# Patient Record
Sex: Male | Born: 1950
Health system: Southern US, Community
[De-identification: ages and names within clinical notes are randomized; demographics above are authoritative.]

## PROBLEM LIST (undated history)

## (undated) DIAGNOSIS — I2510 Atherosclerotic heart disease of native coronary artery without angina pectoris: Secondary | ICD-10-CM

## (undated) DIAGNOSIS — F988 Other specified behavioral and emotional disorders with onset usually occurring in childhood and adolescence: Secondary | ICD-10-CM

## (undated) DIAGNOSIS — Z973 Presence of spectacles and contact lenses: Secondary | ICD-10-CM

## (undated) DIAGNOSIS — N200 Calculus of kidney: Secondary | ICD-10-CM

## (undated) DIAGNOSIS — E785 Hyperlipidemia, unspecified: Secondary | ICD-10-CM

## (undated) DIAGNOSIS — E291 Testicular hypofunction: Secondary | ICD-10-CM

## (undated) DIAGNOSIS — G3184 Mild cognitive impairment, so stated: Secondary | ICD-10-CM

## (undated) DIAGNOSIS — Z972 Presence of dental prosthetic device (complete) (partial): Secondary | ICD-10-CM

## (undated) HISTORY — PX: COLONOSCOPY: SHX174

## (undated) HISTORY — PX: VASECTOMY: SHX75

## (undated) HISTORY — DX: Hyperlipidemia, unspecified: E78.5

## (undated) HISTORY — DX: Atherosclerotic heart disease of native coronary artery without angina pectoris: I25.10

## (undated) HISTORY — PX: UPPER GI ENDOSCOPY: SHX6162

## (undated) HISTORY — DX: Calculus of kidney: N20.0

## (undated) HISTORY — PX: ROTATOR CUFF REPAIR: SHX139

## (undated) HISTORY — PX: DISTAL BICEPS TENDON REPAIR: SHX1461

## (undated) HISTORY — DX: Testicular hypofunction: E29.1

## (undated) HISTORY — DX: Mild cognitive impairment of uncertain or unknown etiology: G31.84

## (undated) HISTORY — PX: KNEE ARTHROSCOPY: SUR90

## (undated) HISTORY — PX: FINGER SURGERY: SHX640

## (undated) HISTORY — DX: Other specified behavioral and emotional disorders with onset usually occurring in childhood and adolescence: F98.8

---

## 1998-05-18 ENCOUNTER — Ambulatory Visit (HOSPITAL_COMMUNITY): Admission: RE | Admit: 1998-05-18 | Discharge: 1998-05-18 | Payer: Self-pay | Admitting: Orthopaedic Surgery

## 2002-11-12 HISTORY — PX: HERNIA REPAIR: SHX51

## 2007-12-17 ENCOUNTER — Emergency Department (HOSPITAL_COMMUNITY): Admission: EM | Admit: 2007-12-17 | Discharge: 2007-12-17 | Payer: Self-pay | Admitting: Emergency Medicine

## 2007-12-17 ENCOUNTER — Encounter: Admission: RE | Admit: 2007-12-17 | Discharge: 2007-12-17 | Payer: Self-pay | Admitting: Family Medicine

## 2008-08-17 ENCOUNTER — Inpatient Hospital Stay (HOSPITAL_COMMUNITY): Admission: RE | Admit: 2008-08-17 | Discharge: 2008-08-20 | Payer: Self-pay | Admitting: Orthopaedic Surgery

## 2008-11-23 ENCOUNTER — Inpatient Hospital Stay (HOSPITAL_COMMUNITY): Admission: RE | Admit: 2008-11-23 | Discharge: 2008-11-26 | Payer: Self-pay | Admitting: Orthopaedic Surgery

## 2010-11-12 HISTORY — PX: JOINT REPLACEMENT: SHX530

## 2011-02-26 LAB — URINALYSIS, ROUTINE W REFLEX MICROSCOPIC
Bilirubin Urine: NEGATIVE
Nitrite: NEGATIVE
Specific Gravity, Urine: 1.024 (ref 1.005–1.030)
Urobilinogen, UA: 0.2 mg/dL (ref 0.0–1.0)

## 2011-02-26 LAB — PROTIME-INR
INR: 1 (ref 0.00–1.49)
INR: 1.1 (ref 0.00–1.49)
INR: 1.8 — ABNORMAL HIGH (ref 0.00–1.49)
Prothrombin Time: 14.7 seconds (ref 11.6–15.2)
Prothrombin Time: 25.3 seconds — ABNORMAL HIGH (ref 11.6–15.2)

## 2011-02-26 LAB — CBC
HCT: 40.6 % (ref 39.0–52.0)
HCT: 46.6 % (ref 39.0–52.0)
Hemoglobin: 13.4 g/dL (ref 13.0–17.0)
Hemoglobin: 13.5 g/dL (ref 13.0–17.0)
MCHC: 33.3 g/dL (ref 30.0–36.0)
MCHC: 34.3 g/dL (ref 30.0–36.0)
MCV: 86.5 fL (ref 78.0–100.0)
MCV: 88.6 fL (ref 78.0–100.0)
Platelets: 201 10*3/uL (ref 150–400)
Platelets: 205 10*3/uL (ref 150–400)
Platelets: 257 10*3/uL (ref 150–400)
RDW: 13.7 % (ref 11.5–15.5)
RDW: 13.9 % (ref 11.5–15.5)
RDW: 14.1 % (ref 11.5–15.5)
WBC: 12.2 10*3/uL — ABNORMAL HIGH (ref 4.0–10.5)

## 2011-02-26 LAB — COMPREHENSIVE METABOLIC PANEL
ALT: 24 U/L (ref 0–53)
Albumin: 4.2 g/dL (ref 3.5–5.2)
Alkaline Phosphatase: 73 U/L (ref 39–117)
BUN: 13 mg/dL (ref 6–23)
Chloride: 105 mEq/L (ref 96–112)
Glucose, Bld: 85 mg/dL (ref 70–99)
Sodium: 140 mEq/L (ref 135–145)

## 2011-02-26 LAB — BASIC METABOLIC PANEL
BUN: 8 mg/dL (ref 6–23)
CO2: 27 mEq/L (ref 19–32)
Calcium: 8.3 mg/dL — ABNORMAL LOW (ref 8.4–10.5)
Calcium: 8.6 mg/dL (ref 8.4–10.5)
Calcium: 9 mg/dL (ref 8.4–10.5)
Chloride: 102 mEq/L (ref 96–112)
GFR calc non Af Amer: >60 mL/min (ref >60)
Glucose, Bld: 112 mg/dL — ABNORMAL HIGH (ref 70–99)
Glucose, Bld: 119 mg/dL — ABNORMAL HIGH (ref 70–99)
Glucose, Bld: 139 mg/dL — ABNORMAL HIGH (ref 70–99)
Potassium: 3.8 mEq/L (ref 3.5–5.1)
Sodium: 135 mEq/L (ref 135–145)
Sodium: 135 mEq/L (ref 135–145)

## 2011-02-26 LAB — DIFFERENTIAL
Basophils Relative: 0 % (ref 0–1)
Eosinophils Absolute: 0.2 10*3/uL (ref 0.0–0.7)
Lymphocytes Relative: 25 % (ref 12–46)
Lymphs Abs: 1.9 10*3/uL (ref 0.7–4.0)
Neutro Abs: 4.9 10*3/uL (ref 1.7–7.7)

## 2011-02-26 LAB — APTT: aPTT: 26 seconds (ref 24–37)

## 2011-02-26 LAB — TYPE AND SCREEN
ABO/RH(D): A POS
Antibody Screen: NEGATIVE

## 2011-02-26 LAB — URINE CULTURE: Culture: NO GROWTH

## 2011-03-27 NOTE — Op Note (Signed)
NAMEMUHSIN, DORIS NO.:  [id number]   MEDICAL RECORD NO.:  [id number]          PATIENT TYPE:  INP   LOCATION:  5033                         FACILITY:  [facility]   PHYSICIAN:  [name], M.D.DATE OF BIRTH:  04-25-1951   DATE OF PROCEDURE:  11/23/2008  DATE OF DISCHARGE:                               OPERATIVE REPORT   PREOPERATIVE DIAGNOSIS:  End-stage osteoarthritis right knee.   POSTOPERATIVE DIAGNOSIS:  End-stage osteoarthritis right knee.   PROCEDURE:  Right total knee replacement with computer navigation.   SURGEON:  [name], MD   ASSISTANT:  Oris Drone. Petrarca, PA-C   ANESTHESIA:  General with supplemental femoral nerve block.   COMPLICATIONS:  None.   COMPONENTS:  DePuy LCS standard plus femoral component #4 rotating  keeled tibial tray with a 12.5-mm bridging bearing, a metal backed three  peg rotating patella.  All was secured with polymethyl methacrylate.   PROCEDURE:  The patient comfortable in the operating room table and  under general orotracheal anesthesia, nursing staff inserted a Foley  catheter.  Urine was clear.   A tourniquet was applied to the right thigh.  Right leg was then prepped  with Betadine scrub and DuraPrep in the tips of the toes to the  tourniquet.  Sterile draping was performed.  With the extremity still  elevated, it was Esmarch exsanguinated with a proximal tourniquet at 350  mmHg.   A midline longitudinal incision was made centered about the patella  extending from the superior pouch of the tibial tubercle where a sharp  dissection and incision was carried down the subcutaneous tissue.  The  first layer of the capsule was incised in the midline.  A medial  parapatellar incision was made with the Bovie through the deep capsule.  There was minimal joint effusion.   Patella was everted to 180 degrees and knee flexed to 90 degrees.   The joint was then evaluated.  There was a mild beefy red  synovitis.  There were osteophytes along the medial and lateral femoral condyle.  There was considerable loss of articular cartilage on the medial femoral  condyle and patella and through a lesser extent the lateral compartment.   We elected to utilize computer navigation.  Two Shantz pins were then  placed in the distal femur, two in the proximal tibia, the computer  arrays were then applied.   Focal points were then morphed to establish normal anatomic alignment  and the size of the components.   Based on the initial morphed points proximal tibial osteotomy was made  with the 7 degree angle of posterior declination.  We checked our cuts  and we were within 1 degree of normal anatomic axis.  Flexion/extension  gaps were then determined.  Subsequent cuts were made on the femur using  the computer points.  We had symmetrical flexion/extension gaps with a  12.5 mm bridging bearing.  Lamina spreader was inserted along the medial  lateral joint to remove medial lateral menisci, ACL and PCL and  osteophytes from the posterior femoral condyle medially and laterally.  At each bony cut, we  checked to be sure that we released within 1 degree  of the normal atomic axis.   Retractors were then placed about the tibia.  A center hole was made  using a #4 tibial tray.  The keeled cut was then made.  A 12.5 mm  bridging bearing was then placed onto the trial tibia followed by the  trial standard plus femoral component.  The entire construct was then  reduced and through full range of motion, we had full extension and  slight maybe 1 degree of hyperextension.  Opening with the varus or  valgus stress, I could flex at least 125 degrees.  There was no  malrotation on the tibia.   The patella was then prepared by removing 10 mm of bone leaving 13 mm of  patella thickness.  The three peg holes were then made within the  patella jig.  The trial patella was applied and through a full range of  motion  there was no subluxation.   Trial components were then removed.  The joint was copiously irrigated  with saline solution.  Marcaine with epinephrine was injected along the  deep capsule.  The final components were then inserted with polymethyl  methacrylate.  We initially applied the tibial component and removed  extraneous methacrylate.  The polyethylene liner was then inserted  followed by the cemented femoral component.  At each step, the  extraneous methacrylate was removed.  The knee was placed in full  extension.  The patella was then applied with a patellar clamp and again  any extraneous methacrylate was removed.  FloSeal was then injected into  the joint.  After complete maturation of the methacrylate, the joint was  explored.  There was no further extraneous methacrylate.  The tourniquet  was deflated.  There was very minimal bleeding from the injection of the  Marcaine with epinephrine and FloSeal.  Any further bleeding was  controlled with the Bovie.  The wound was then lightly irrigated with  saline solution.  The deep capsule was closed with interrupted #1  Ethibond, superficial capsule closed with a running 0 Vicryl, subcu with  2-0 Vicryl, and skin closed with skin clips.  Sterile bulky dressing was  applied followed by the patient's compressive stocking.   The patient tolerated the procedure without complications.  Technically,  it went very well and we had very nice alignment.      [name], M.D.  Electronically Signed     PWW/MEDQ  D:  11/23/2008  T:  11/23/2008  Job:  [id number]

## 2011-03-27 NOTE — Op Note (Signed)
[name], DALEO NO.:  [id number]   MEDICAL RECORD NO.:  [id number]          PATIENT TYPE:  INP   LOCATION:  2899                         FACILITY:  [facility]   PHYSICIAN:  [name], M.D.DATE OF BIRTH:  07-09-1951   DATE OF PROCEDURE:  08/17/2008  DATE OF DISCHARGE:                               OPERATIVE REPORT   PREOPERATIVE DIAGNOSIS:  Osteoarthritis, left knee.   POSTOPERATIVE DIAGNOSIS:  Osteoarthritis, left knee.   PROCEDURE:  Left total knee replacement with computer navigation.   SURGEON:  [name], MD   ASSISTANT:  Oris Drone. Petrarca, PA-C   ANESTHESIA:  General with supplemental femoral nerve block.   COMPLICATIONS:  None.   COMPONENTS:  DePuy low-contact stress standard plus femoral component,  #4 rotating keeled tibial tray with a 12.5-mm bridging bearing, metal-  backed 3 peg rotating patella, all were secured with  polymethylmethacrylate.   PROCEDURE:  The patient was met in the holding area.  He underwent the  femoral nerve block.  Any questions were answered.  The left lower  extremity was marked as the appropriate extremity.  The patient was  taken to room #15.  He was placed under general orotracheal anesthesia.  Nursing staff inserted a Foley catheter.  Urine was clear.   The left lower extremity was then placed in a thigh tourniquet.  The leg  was then prepped with DuraPrep x2 from the tourniquet to the midfoot.  Sterile draping was performed.  With the extremity still elevated, it  was Esmarch exsanguinated with a proximal tourniquet at 350 mmHg.   A midline longitudinal incision was made, centered about the patella,  and extending from the superior pouch to the tibial tubercle via sharp  dissection.  Incision was carried down through the subcutaneous tissue.  The first layer of capsule was incised in the midline.  A medial  parapatellar incision was made longitudinally with the Bovie through the  deep capsule.   The joint was then entered.  There was minimal-to-clear  yellow joint effusion.  Patella was then everted to 108 degrees  laterally.  The knee was flexed to 90 degrees.  There was near-complete  absence of articular cartilage in the medial compartment, large  osteophytes along the medial and lateral femoral condyle.  There was  considerable eburnation of the patellar articular cartilage and slight  varus, which I could correct to neutral.   We elected to employ a computer navigation.  Two Shantz pins were then  placed in the distal femur and 2 in the proximal tibia.  The computer  arrays were then applied.   A normal anatomical axis was then determined followed by focal points on  the tibia and femur.  The computer confirmed the need for a standard  plus femoral component, which we had templated preoperatively and #4  rotating tibial tray.   Based on the computer morphed points, the initial cut was made  transversely along the proximal tibia with a 7-degree angle of  declination.  We then determined flexion/extension gaps, which appeared  to be symmetrical.   Subsequent cuts were then  made on the femur employing the computer  morphed points.  A lamina spreader was inserted into the medial and  lateral compartments, medial and lateral menisci, and remnants of ACL  and PCL were then removed.  Osteophytes removed from the posterior  femoral condyle, both medially and laterally.   A final taper cut was then made on the femur.   Retractor was then placed about the tibia.  We determined a #4 tibial  tray.  A center cut was made followed by the keeled cut.  Osteophytes  removed from the medial tibial tray.  We initially tried a 10 mm, then a  12.5-mm bridging bearing followed by the trial femoral component.  We  had full extension with may be 1 degree of extension.  No opening with  varus or valgus stress and very nice alignment varus-valgus.   The patella was then prepared by removing  10 mm of bone leaving 14 mm  patella thickness.   The peg holes were then made with the template with a patella jig.  The  trial patella was applied to a full range of motion.  There was no  maltracking.   The trial components were then removed.  The joint was copiously  irrigated with jet saline.  The knee was then flexed to 90 degrees.  We  again cleansed the joint with jet saline.  Retractors were placed about  the tibia.  We initially inserted the tibial tray with  polymethylmethacrylate and impacted to flush on the bone, removed  extraneous methacrylate with a Freer.  The 12.5-mm bridging bearing was  then applied followed by the cemented standard plus femoral component.  Again, any extraneous methacrylate was removed.  Patella was applied  with methacrylate and patellar clamp.  Knee was then placed in  extension.  Any further extraneous methacrylate was removed.  After  complete maturation of the methacrylate, the joint was inspected.  We  removed the Shantz pins and the computer arrays after determining that  we had excellent position of the components.  Through a full range of  motion, patella was stable.  There was no malrotation of the tibial  tray.  We did not open up the varus or valgus stress, we had full  extension, and we could flex it easily 120 degrees.   Tourniquet was deflated.  We injected the capsule with Marcaine with  epinephrine.  Gross bleeders were Bovie coagulated.  We had a nice dry  feel and did not require a Hemovac.   Deep capsule was then closed with interrupted #1 Ethibond, superficial  capsule with running 0 Vicryl, subcu with 2-0 Vicryl.  Skin was closed  with skin clips.  Sterile bulky dressing was applied followed by the  patient's compressive stocking.   We also injected his right knee with 5 mL of 1% Xylocaine without  epinephrine and 80 mg of Depo-Medrol per his desire and noted  preoperatively.   The patient tolerated the procedure without  complications.     [name], M.D.  Electronically Signed    PWW/MEDQ  D:  08/17/2008  T:  08/17/2008  Job:  [id number]

## 2011-03-30 NOTE — Discharge Summary (Signed)
NAMEBRITNEY, CAPTAIN NO.:  [id number]   MEDICAL RECORD NO.:  [id number]          PATIENT TYPE:  INP   LOCATION:  5033                         FACILITY:  [facility]   PHYSICIAN:  [name], M.D.DATE OF BIRTH:  1951/07/04   DATE OF ADMISSION:  11/23/2008  DATE OF DISCHARGE:  11/26/2008                               DISCHARGE SUMMARY   ADMISSION DIAGNOSIS:  Osteoarthritis of the right knee.   DISCHARGE DIAGNOSES:  1. Osteoarthritis of the right knee.  2. Status post left total knee arthroplasty.  3. Attention deficit disorder.   PROCEDURE:  Right total knee arthroplasty.   HISTORY:  Gokul Waybright is a 60 year old white male status post left  total knee arthroplasty with good results.  Now having worsening pain in  his right knee, especially with activity.  He has pain with ambulation  and at rest.  He has failed conservative treatment.  Treatment has  included a corticosteroid and hyaluronic injections.  Indicated for  total knee arthroplasty on the right.   HOSPITAL COURSE:  A 60 year old white male admitted November 23, 2008  after appropriate laboratory studies were obtained as well as Ancef IV  on-call to the operating room.  He was taken to the operating room where  he underwent a right total knee arthroplasty.  He tolerated the  procedure well.  He was placed on a full-dose PCA Dilaudid pump postop.  Dilaudid 2 mg one to 2 tablets q.4 h. p.r.n. pain.  CPM was placed 0-60  degrees for 6-8 hours per day.  Consults PT, OT and care management were  made.  Partial weightbearing 50% on the right.  Foley was placed  intraoperatively.  Coumadin was begun as per protocol.  Lovenox 30 mg  subcu q.12 hours until the INR was greater than 1.8.   He was allowed out of bed to a chair the following day.  He was weaned  off his IVs and his PCA pump.  On December 26, 2008 he had a dressing  change.  His IV Dilaudid was discontinued.  Flexeril 10 mg p.o. q.8 h.  p.r.n. spasms was ordered.  His Lovenox was discontinued on December 26, 2008.  He did have problems with constipation and milk of magnesia was  given at 30 mL p.o. q.12 h. p.r.n. beginning on November 25, 2008.  Because of some redness in the wound, he was started on Ancef 1 gram IV  on December 27, 2008 and Keflex 500 mg q.i.d. was ordered.  He was  discharged on December 27, 2008 to return back to the office in 2 weeks.  EKG which showed normal sinus rhythm with early repolarization.   LABORATORY STUDIES:  Hemoglobin 15.4, hematocrit 46.6%, white count  7,800, platelets 257,000.  Discharge hemoglobin 13.4, hematocrit 39.1%,  white count 12,200, platelets were 205,000.  Preop protime 13.2, INR  1.0, PTT 26.  Discharge PT 25.3, INR was 2.1.  Preop sodium 140,  potassium 4.8, chloride 105, CO2 27, glucose 85, BUN 13, creatinine  1.04.  Discharge sodium 136, potassium 3.8, chloride 98, CO2 23, glucose  119, BUN 8,  creatinine 0.98.  GFR was greater than 60 throughout his  hospital course.  Preop total protein 6.5, albumin 4.2, AST 23, ALT 24,  ALP 73 and total bilirubin was 1.0.  Urinalysis was negative for voided  urine.  No growth on cultures of the urine.  Blood type was A+, antibody  screen negative.   DISCHARGE INSTRUCTIONS:  No restriction diet.  Keep the incision clean  and dry and change dressing daily.  Follow the instruction sheet.  Increase his activity slowly and ambulate with a walker 50%  weightbearing is taught.  May shower on Saturday.  No lifting or driving  for 6 weeks.   PRESCRIPTIONS:  1. Dilaudid 2 mg one to two tablets every 3 to 4 hours needed for      pain.  2. Coumadin 5 mg as directed by Home Health.  3. Valium 5 mg one tablet every 8 hours as needed for spasms.  4. Keflex 500 mg 1 tablet q.i.d.  5. Colace 100 mg b.i.d.  6. MiraLax as needed for constipation.   [facility] for his home health physical therapy and nursing.  Follow up with Dr.  Cleophas Dunker on May 06, 2009.  Discharged in improved  condition.      [name], P.A.-C.      [name], M.D.  Electronically Signed    BDP/MEDQ  D:  01/06/2009  T:  01/07/2009  Job:  [id number]

## 2011-03-30 NOTE — Discharge Summary (Signed)
[name], [name] NO.:  [id number]   MEDICAL RECORD NO.:  [id number]          PATIENT TYPE:  INP   LOCATION:  5017                         FACILITY:  [facility]   PHYSICIAN:  [name], M.D.DATE OF BIRTH:  05-25-1951   DATE OF ADMISSION:  08/17/2008  DATE OF DISCHARGE:  08/20/2008                               DISCHARGE SUMMARY   ADMISSION DIAGNOSIS:  Osteoarthritis of the left knee.   DISCHARGE DIAGNOSES:  1. Osteoarthritis, left knee.  2. Osteoarthritis, right knee.  3. Attention deficit disorder.  4. Posthemorrhagic anemia.   PROCEDURE:  Total knee replacement on the left with computer navigation.   HISTORY:  [name] is a 60 year old male with bilateral knee pain, left  greater than right.  He is very active and snow skies.  After a trip in  February 2009, had increasing pain in his knees, with mild to very  severe pain which worsens with activities.  Pain improved with rest.  He  has failed conservative treatment, including corticosteroid and Hyalgan  injections.  Significant decrease, the most in medial joint line, left  knee.  For left total knee arthroplasty.   HOSPITAL COURSE:  A 60 year old male, admitted on August 17, 2008, after  appropriate laboratory studies were obtained as well as 1 g Ancef IV on  call to the operating room.  He was taken to the operating room, where  he underwent a left total knee arthroplasty, with injection of the right  knee with corticosteroids.  He tolerated the procedure well.  He was  continued on Ancef 1 g IV q.6 h. for 3 doses.  Started on Coumadin per  pharmacy protocol.  Lovenox 30 mg subcu q.12 h. beginning on August 18, 2008, at 8 a.m.  Consults with PT, OT, care management were made.  CPM 0-  60 degrees for 6-8 hours per day, increasing by 5-10 degrees per day.  Dilaudid full-dose PCA pump was used postoperatively.  He was allowed  out of bed to a chair the following day.  He was weaned off PCA.  Incentive spirometer was ordered.  Lovenox instructions were given, but  did not need to have this since his Coumadin came back very nicely and  quickly.  His Daypro and Ritalin were discontinued on August 19, 2008.  His dressing was changed.  His wound was benign.  He was discharged on  August 20, 2008, to return back to the office in 2 weeks for followup.  EKG revealed normal sinus rhythm with early repolarization.  Chest x-ray  revealed no acute active process seen;  August 12, 2008, was the date of  exam.   LABORATORY STUDIES:  Admitted with hemoglobin 15.4, hematocrit 44.3%,  white count 7500, and platelets 242,000.  Discharge hemoglobin 12.3,  hematocrit 35.1%, white count 10,700, and platelets 181,000.  Preop  protime 13.9 and INR was 1.0.  Discharge protime 32.1 and INR 2.9.  Preop sodium 138, potassium 4.8, chloride 106, CO2 26, glucose 94, BUN  13, and creatinine 0.97.  Discharge sodium 135, potassium 3.7, chloride  100, CO2 26, glucose 132, BUN 7, and  creatinine 0.66.  GFR was greater  than 60 throughout the hospital course.  Preop total protein 6.1,  albumin 4.2, AST 17, ALT 18, ALP 53, and total bilirubin 1.0.  Urinalysis benign for voided urine.  Blood type A+, antibody screen  negative.  No growth on urine culture.   DISCHARGE INSTRUCTIONS:  Keep incision clean and dry and cover daily.  May increase activity slowly.  He may shower on Saturday.  No lifting or  driving for 6 weeks.  Keep his incision clean and dry and cover daily  with 4x4s.  Follow blue instruction sheet.  CPM 0-70 degrees for 6-8  hours per day, increasing 5-10 degrees per day.  Start weightbearing as  tolerated.  Restart Ritalin.  Prescriptions for Percocet 5/325 one to  two tablets every 4 hours  as needed for pain.  Robaxin 500 mg 1 tablet every 6 hours as needed for  spasms.  Coumadin 5 mg as directed by home health pharmacist.  Colace  100 mg twice a day until soft.  Follow back up with [place] on August 30, 2008.  Discharged in improved condition.      [name], P.A.-C.      [name], M.D.  Electronically Signed    BDP/MEDQ  D:  10/10/2008  T:  10/11/2008  Job:  [id number]

## 2011-08-03 LAB — URINALYSIS, ROUTINE W REFLEX MICROSCOPIC
Bilirubin Urine: NEGATIVE
Glucose, UA: NEGATIVE
Leukocytes, UA: NEGATIVE
Nitrite: NEGATIVE
Protein, ur: NEGATIVE
Urobilinogen, UA: 0.2
pH: 5

## 2011-08-03 LAB — DIFFERENTIAL
Eosinophils Absolute: 0.1
Lymphocytes Relative: 14
Monocytes Absolute: 1.1 — ABNORMAL HIGH
Monocytes Relative: 7

## 2011-08-03 LAB — CBC
HCT: 44.5
MCV: 87.5
RBC: 5.09
RDW: 13.4
WBC: 15.3 — ABNORMAL HIGH

## 2011-08-03 LAB — I-STAT 8, (EC8 V) (CONVERTED LAB)
Chloride: 108
HCT: 47
Hemoglobin: 16
Operator id: 277751
Potassium: 3.8
Sodium: 137
TCO2: 24

## 2011-08-03 LAB — URINE MICROSCOPIC-ADD ON

## 2011-08-03 LAB — POCT I-STAT CREATININE
Creatinine, Ser: 1.1
Operator id: 277751

## 2011-08-13 LAB — CBC
HCT: 35.1 — ABNORMAL LOW
HCT: 36.6 — ABNORMAL LOW
Hemoglobin: 12.4 — ABNORMAL LOW
Hemoglobin: 13
MCHC: 34.1
MCHC: 34.1
MCV: 87.9
MCV: 88.6
MCV: 88.8
MCV: 89.7
Platelets: 181
Platelets: 242
RBC: 4.3
RBC: 5.04
RDW: 13
RDW: 13
RDW: 13.1
WBC: 7.5

## 2011-08-13 LAB — BASIC METABOLIC PANEL
BUN: 7
BUN: 9
CO2: 26
CO2: 29
Chloride: 100
Chloride: 102
Chloride: 103
Creatinine, Ser: 0.94
GFR calc Af Amer: >60
GFR calc non Af Amer: >60
Glucose, Bld: 105 — ABNORMAL HIGH
Glucose, Bld: 118 — ABNORMAL HIGH
Glucose, Bld: 132 — ABNORMAL HIGH
Potassium: 3.6
Potassium: 4.1
Sodium: 136

## 2011-08-13 LAB — URINE CULTURE
Colony Count: NO GROWTH
Culture: NO GROWTH

## 2011-08-13 LAB — COMPREHENSIVE METABOLIC PANEL
ALT: 18
AST: 17
Albumin: 4.2
CO2: 26
Calcium: 9.4
Chloride: 106
Creatinine, Ser: 0.97
GFR calc Af Amer: >60
GFR calc non Af Amer: >60
Sodium: 138

## 2011-08-13 LAB — DIFFERENTIAL
Eosinophils Absolute: 0.1
Eosinophils Relative: 2
Lymphocytes Relative: 28
Lymphs Abs: 2.1
Monocytes Absolute: 0.7

## 2011-08-13 LAB — URINALYSIS, ROUTINE W REFLEX MICROSCOPIC
Glucose, UA: NEGATIVE
Hgb urine dipstick: NEGATIVE
Specific Gravity, Urine: 1.027
pH: 6

## 2011-08-13 LAB — TYPE AND SCREEN

## 2013-03-02 ENCOUNTER — Encounter (INDEPENDENT_AMBULATORY_CARE_PROVIDER_SITE_OTHER): Payer: Self-pay | Admitting: General Surgery

## 2013-03-02 ENCOUNTER — Encounter: Payer: Self-pay | Admitting: Family Medicine

## 2013-03-02 ENCOUNTER — Encounter: Payer: Self-pay | Admitting: Emergency Medicine

## 2013-03-02 ENCOUNTER — Ambulatory Visit (INDEPENDENT_AMBULATORY_CARE_PROVIDER_SITE_OTHER): Payer: 59 | Admitting: Family Medicine

## 2013-03-02 ENCOUNTER — Encounter (HOSPITAL_COMMUNITY): Payer: Self-pay | Admitting: Emergency Medicine

## 2013-03-02 ENCOUNTER — Telehealth: Payer: Self-pay | Admitting: Family Medicine

## 2013-03-02 ENCOUNTER — Inpatient Hospital Stay (HOSPITAL_COMMUNITY)
Admission: EM | Admit: 2013-03-02 | Discharge: 2013-03-05 | DRG: 418 | Disposition: A | Discharge status: Home / Self Care | Payer: 59 | Attending: General Surgery | Admitting: General Surgery

## 2013-03-02 ENCOUNTER — Emergency Department (HOSPITAL_COMMUNITY): Payer: 59

## 2013-03-02 VITALS — BP 142/76 | HR 86 | Temp 98.7°F | Resp 18

## 2013-03-02 DIAGNOSIS — F988 Other specified behavioral and emotional disorders with onset usually occurring in childhood and adolescence: Secondary | ICD-10-CM | POA: Insufficient documentation

## 2013-03-02 DIAGNOSIS — E785 Hyperlipidemia, unspecified: Secondary | ICD-10-CM | POA: Diagnosis present

## 2013-03-02 DIAGNOSIS — N200 Calculus of kidney: Secondary | ICD-10-CM | POA: Insufficient documentation

## 2013-03-02 DIAGNOSIS — K8050 Calculus of bile duct without cholangitis or cholecystitis without obstruction: Secondary | ICD-10-CM | POA: Diagnosis present

## 2013-03-02 DIAGNOSIS — K8020 Calculus of gallbladder without cholecystitis without obstruction: Secondary | ICD-10-CM

## 2013-03-02 DIAGNOSIS — E291 Testicular hypofunction: Secondary | ICD-10-CM | POA: Diagnosis present

## 2013-03-02 DIAGNOSIS — R7401 Elevation of levels of liver transaminase levels: Secondary | ICD-10-CM | POA: Diagnosis present

## 2013-03-02 DIAGNOSIS — R109 Unspecified abdominal pain: Secondary | ICD-10-CM

## 2013-03-02 DIAGNOSIS — N1330 Unspecified hydronephrosis: Secondary | ICD-10-CM | POA: Diagnosis present

## 2013-03-02 DIAGNOSIS — R7989 Other specified abnormal findings of blood chemistry: Secondary | ICD-10-CM | POA: Diagnosis present

## 2013-03-02 DIAGNOSIS — K8010 Calculus of gallbladder with chronic cholecystitis without obstruction: Secondary | ICD-10-CM

## 2013-03-02 LAB — CBC WITH DIFFERENTIAL/PLATELET
Basophils Absolute: 0 10*3/uL (ref 0.0–0.1)
Eosinophils Absolute: 0.1 10*3/uL (ref 0.0–0.7)
Eosinophils Relative: 1 % (ref 0–5)
Lymphs Abs: 1.2 10*3/uL (ref 0.7–4.0)
MCH: 29.6 pg (ref 26.0–34.0)
MCHC: 35.9 g/dL (ref 30.0–36.0)
MCV: 82.3 fL (ref 78.0–100.0)
Platelets: 167 10*3/uL (ref 150–400)
RDW: 13.5 % (ref 11.5–15.5)

## 2013-03-02 LAB — COMPREHENSIVE METABOLIC PANEL
ALT: 165 U/L — ABNORMAL HIGH (ref 0–53)
Calcium: 9.2 mg/dL (ref 8.4–10.5)
GFR calc Af Amer: >90 mL/min (ref >90)
Glucose, Bld: 120 mg/dL — ABNORMAL HIGH (ref 70–99)
Sodium: 134 mEq/L — ABNORMAL LOW (ref 135–145)
Total Protein: 7.1 g/dL (ref 6.0–8.3)

## 2013-03-02 MED ORDER — KCL IN DEXTROSE-NACL 20-5-0.45 MEQ/L-%-% IV SOLN
INTRAVENOUS | Status: DC
  Administered 2013-03-02 – 2013-03-04 (×4): via INTRAVENOUS
  Filled 2013-03-02 (×8): qty 1000

## 2013-03-02 MED ORDER — SODIUM CHLORIDE 0.9 % IV BOLUS (SEPSIS)
1000.0000 mL | Freq: Once | INTRAVENOUS | Status: AC
  Administered 2013-03-02: 1000 mL via INTRAVENOUS

## 2013-03-02 MED ORDER — HYDROMORPHONE HCL PF 1 MG/ML IJ SOLN
INTRAMUSCULAR | Status: AC
  Filled 2013-03-02: qty 1

## 2013-03-02 MED ORDER — MORPHINE SULFATE 2 MG/ML IJ SOLN
1.0000 mg | INTRAMUSCULAR | Status: DC | PRN
  Administered 2013-03-02 (×2): 2 mg via INTRAVENOUS
  Filled 2013-03-02 (×2): qty 1

## 2013-03-02 MED ORDER — ONDANSETRON HCL 4 MG/2ML IJ SOLN
4.0000 mg | Freq: Once | INTRAMUSCULAR | Status: AC
  Administered 2013-03-02: 4 mg via INTRAVENOUS
  Filled 2013-03-02: qty 2

## 2013-03-02 MED ORDER — HYDROMORPHONE HCL PF 1 MG/ML IJ SOLN
1.0000 mg | INTRAMUSCULAR | Status: DC | PRN
  Administered 2013-03-02 – 2013-03-05 (×8): 1 mg via INTRAVENOUS
  Filled 2013-03-02 (×8): qty 1

## 2013-03-02 MED ORDER — SODIUM CHLORIDE 0.9 % IV SOLN
3.0000 g | Freq: Four times a day (QID) | INTRAVENOUS | Status: DC
  Administered 2013-03-02 – 2013-03-05 (×11): 3 g via INTRAVENOUS
  Filled 2013-03-02 (×15): qty 3

## 2013-03-02 MED ORDER — DIPHENHYDRAMINE HCL 50 MG/ML IJ SOLN: 12.5000 mg | Freq: Four times a day (QID) | INTRAMUSCULAR | Status: DC | PRN

## 2013-03-02 MED ORDER — HYDROMORPHONE HCL PF 1 MG/ML IJ SOLN
1.0000 mg | Freq: Once | INTRAMUSCULAR | Status: AC
  Administered 2013-03-02: 1 mg via INTRAVENOUS
  Filled 2013-03-02: qty 1

## 2013-03-02 MED ORDER — DIPHENHYDRAMINE HCL 12.5 MG/5ML PO ELIX
12.5000 mg | ORAL_SOLUTION | Freq: Four times a day (QID) | ORAL | Status: DC | PRN
Start: 2013-03-02 — End: 2013-03-05

## 2013-03-02 MED ORDER — ONDANSETRON HCL 4 MG/2ML IJ SOLN: 4.0000 mg | Freq: Four times a day (QID) | INTRAMUSCULAR | Status: DC | PRN

## 2013-03-02 NOTE — ED Notes (Signed)
MD at bedside. 

## 2013-03-02 NOTE — H&P (Signed)
Will recheck LFTs in AM.  Will get GI to consult tonight, but more than likely, if repeat LFTs are better, will probably just operate and get IOC.  If worse, possible MRCP versus ERCP.  [name]. [name], MD, FACS [phone] [facility]

## 2013-03-02 NOTE — H&P (Signed)
[name] 10-Aug-1951  [id number].   Chief Complaint/Reason for Consult: abdominal pain HPI: This is a pleasant 62 yo male who ate some ribs several nights ago and woke up with epigastric abdominal pain.  It bothered him the whole night.  He has one episode of emesis.  His pain improved some.  He thought he might have a GI bug.  The next days he had mild pain that would still occasionally bother him.  This morning though, he woke up and ate breakfast around 6:15am. After breakfast he developed severe sharp epigastric abdominal pain that was worse than any previous pain.  No nausea or vomiting this time.  He presented to the [facility] for further evaluation.  He was found to have gallstones on ultrasound and elevated LFTs.  His pain is persisting.  We have been asked to see the patient for further evaluation.  Review of Systems: Please see HPI, otherwise negative.  History reviewed. No pertinent family history.  Past Medical History  Diagnosis Date  . ADD (attention deficit disorder)   . HLD (hyperlipidemia)   . Hypogonadism male   . Nephrolithiasis     Past Surgical History  Procedure Laterality Date  . Vasectomy    . Joint replacement    . Distal biceps tendon repair Left   . Rotator cuff repair Right   . Hernia repair      with mesh    Social History:  reports that he has never smoked. He does not have any smokeless tobacco history on file. He reports that he does not drink alcohol. His drug history is not on file.  Allergies:  Allergies  Allergen Reactions  . Valium (Diazepam) Other (See Comments)    Pt states "it jacks him up"     (Not in a hospital admission)  Blood pressure 135/93, pulse 95, temperature 98.9 F (37.2 C), temperature source Oral, resp. rate 20, SpO2 99.00%. Physical Exam: General: pleasant, WD, WN white male who is laying in bed in NAD HEENT: head is normocephalic, atraumatic.  Sclera are noninjected.  PERRL.  Ears and nose without any masses or  lesions.  Mouth is pink and moist Heart: regular, rate, and rhythm.  Normal s1,s2. No obvious murmurs, gallops, or rubs noted.  Palpable radial and pedal pulses bilaterally Lungs: CTAB, no wheezes, rhonchi, or rales noted.  Respiratory effort nonlabored Abd: soft, tender in epigastrium and RUQ, ND, +BS, no masses or organomegaly, right inguinal hernia MS: all 4 extremities are symmetrical with no cyanosis, clubbing, or edema. Skin: warm and dry with no masses, lesions, or rashes Psych: A&Ox3 with an appropriate affect.    Results for orders placed during the hospital encounter of 03/02/13 (from the past 48 hour(s))  CBC WITH DIFFERENTIAL     Status: Abnormal   Collection Time    03/02/13 10:40 AM      Result Value Range   WBC 11.6 (*) 4.0 - 10.5 K/uL   RBC 5.65  4.22 - 5.81 MIL/uL   Hemoglobin 16.7  13.0 - 17.0 g/dL   HCT 40.9  81.1 - 91.4 %   MCV 82.3  78.0 - 100.0 fL   MCH 29.6  26.0 - 34.0 pg   MCHC 35.9  30.0 - 36.0 g/dL   RDW 78.2  95.6 - 21.3 %   Platelets 167  150 - 400 K/uL   Neutrophils Relative 77  43 - 77 %   Neutro Abs 9.0 (*) 1.7 - 7.7 K/uL   Lymphocytes  Relative 10 (*) 12 - 46 %   Lymphs Abs 1.2  0.7 - 4.0 K/uL   Monocytes Relative 11  3 - 12 %   Monocytes Absolute 1.3 (*) 0.1 - 1.0 K/uL   Eosinophils Relative 1  0 - 5 %   Eosinophils Absolute 0.1  0.0 - 0.7 K/uL   Basophils Relative 0  0 - 1 %   Basophils Absolute 0.0  0.0 - 0.1 K/uL  COMPREHENSIVE METABOLIC PANEL     Status: Abnormal   Collection Time    03/02/13 10:40 AM      Result Value Range   Sodium 134 (*) 135 - 145 mEq/L   Potassium 4.0  3.5 - 5.1 mEq/L   Chloride 98  96 - 112 mEq/L   CO2 26  19 - 32 mEq/L   Glucose, Bld 120 (*) 70 - 99 mg/dL   BUN 13  6 - 23 mg/dL   Creatinine, Ser 1.61  0.50 - 1.35 mg/dL   Calcium 9.2  8.4 - 09.6 mg/dL   Total Protein 7.1  6.0 - 8.3 g/dL   Albumin 3.6  3.5 - 5.2 g/dL   AST 045 (*) 0 - 37 U/L   ALT 165 (*) 0 - 53 U/L   Alkaline Phosphatase 119 (*) 39 - 117 U/L    Total Bilirubin 2.0 (*) 0.3 - 1.2 mg/dL   GFR calc non Af Amer >90  >90 mL/min   GFR calc Af Amer >90  >90 mL/min   Comment:            The eGFR has been calculated     using the CKD EPI equation.     This calculation has not been     validated in all clinical     situations.     eGFR's persistently     <90 mL/min signify     possible Chronic Kidney Disease.  LIPASE, BLOOD     Status: None   Collection Time    03/02/13 10:40 AM      Result Value Range   Lipase 34  11 - 59 U/L   US Abdomen Complete  03/02/2013  *RADIOLOGY REPORT*  Clinical Data:  Abdominal pain.  ABDOMEN ULTRASOUND  Technique:  Complete abdominal ultrasound examination was performed including evaluation of the liver, gallbladder, bile ducts, pancreas, kidneys, spleen, IVC, and abdominal aorta.  Comparison:  CT of the abdomen on 12/17/2007.  Findings:  Gallbladder:  Multiple mobile calculi are identified in the gallbladder.  There is no evidence of overt gallbladder wall thickening, pericholecystic fluid or sonographic Murphy's sign.  Common Bile Duct:  Maximal diameter of 7 mm.  Liver:  The liver shows diffusely increased echogenicity and some areas of geographic lower echogenicity.  Findings are most likely consistent with steatosis with areas of fatty sparing.  No focal mass or intrahepatic biliary ductal dilatation is identified.  IVC:  Patent throughout its visualized course in the abdomen.  Pancreas:  Although the pancreas is difficult to visualize in its entirety, no focal pancreatic abnormality is identified.  Spleen:  The spleen is of normal echotexture and size.  Kidneys:  The right kidney measures approximately 11.8 cm and the left kidney 12.6 cm. There is suggestion of mild hydronephrosis of the right kidney.  A simple cyst measures 2.0 cm.  The left kidney demonstrates some prominence of the lower pole collecting system without overt significant hydronephrosis.  Simple cyst of the lower left kidney measures 1.5 cm.  Abdominal Aorta:  Normal caliber visualized abdominal aorta.  IMPRESSION:  1.  Cholelithiasis with multiple gallstones identified.  There is no overt evidence of cholecystitis or biliary obstruction. 2.  Hepatic steatosis with areas of fatty sparing. 3.  Suggestion of mild hydronephrosis of the right kidney and mild prominence of the lower pole collecting system of the left kidney. The patient does have a history of renal calculi.   Original Report Authenticated By: Irish Lack, M.D.        Assessment/Plan 1. Biliary colic, ? Choledocholithiasis 2. Transaminitis, hyperbilirubinemia 3. ADD 4. Hypercholesterolemia  Plan: 1. The patient is still symptomatic.  i suspect he has biliary colic and may even have choledocholithiasis.  I will need to d/w Dr. Lindie Spruce to determine if he would like to proceed with OR today vs rechecking labs in the morning to determine if GI needs to be consulted for worsening LFTs.  Will keep the patient NPO for now.  Mercer Stallworth E 03/02/2013, 1:43 PM Pager: [phone]

## 2013-03-02 NOTE — ED Notes (Signed)
Patient transported to CT 

## 2013-03-02 NOTE — ED Provider Notes (Signed)
History     CSN: [id number]  Arrival date & time 03/02/13  1000   First MD Initiated Contact with Patient 03/02/13 1007      Chief Complaint  Patient presents with  . Abdominal Pain     HPI Patient reports developing severe upper abdominal pain with nausea this morning.  He's had intermittent episodes of upper abdominal pain associated with food over the past 2 days.  He has no known history of gallstones.  He denies vomiting.  No fevers or chills.  His pain this morning was severe and is constant.  His pain at this time is moderate to severe in severity.  His pain is worsened by movement and palpation of his right upper quadrant.  He was seen by his primary care physician is in the emergency department thinking he needed advanced imaging.    Past Medical History  Diagnosis Date  . ADD (attention deficit disorder)   . HLD (hyperlipidemia)   . Hypogonadism male   . Nephrolithiasis     Past Surgical History  Procedure Laterality Date  . Vasectomy    . Hernia repair    . Joint replacement      History reviewed. No pertinent family history.  History  Substance Use Topics  . Smoking status: Never Smoker   . Smokeless tobacco: Not on file  . Alcohol Use: Not on file      Review of Systems  All other systems reviewed and are negative.    Allergies  Valium  Home Medications   Current Outpatient Rx  Name  Route  Sig  Dispense  Refill  . methylphenidate (RITALIN) 20 MG tablet   Oral   Take 20 mg by mouth daily as needed.         . pravastatin (PRAVACHOL) 40 MG tablet   Oral   Take 40 mg by mouth daily.           BP 135/93  Pulse 95  Temp(Src) 98.9 F (37.2 C) (Oral)  Resp 20  SpO2 99%  Physical Exam  Nursing note and vitals reviewed. Constitutional: He is oriented to person, place, and time. He appears well-developed and well-nourished.  HENT:  Head: Normocephalic and atraumatic.  Eyes: EOM are normal.  Neck: Normal range of motion.   Cardiovascular: Normal rate, regular rhythm, normal heart sounds and intact distal pulses.   Pulmonary/Chest: Effort normal and breath sounds normal. No respiratory distress.  Abdominal: Soft. He exhibits no distension.  Right upper quadrant tenderness with guarding no rebound.  No peritonitis.  Musculoskeletal: Normal range of motion.  Neurological: He is alert and oriented to person, place, and time.  Skin: Skin is warm and dry.  Psychiatric: He has a normal mood and affect. Judgment normal.    ED Course  Procedures (including critical care time)  Labs Reviewed  CBC WITH DIFFERENTIAL - Abnormal; Notable for the following:    WBC 11.6 (*)    Neutro Abs 9.0 (*)    Lymphocytes Relative 10 (*)    Monocytes Absolute 1.3 (*)    All other components within normal limits  COMPREHENSIVE METABOLIC PANEL - Abnormal; Notable for the following:    Sodium 134 (*)    Glucose, Bld 120 (*)    AST 270 (*)    ALT 165 (*)    Alkaline Phosphatase 119 (*)    Total Bilirubin 2.0 (*)    All other components within normal limits  LIPASE, BLOOD   US Abdomen Complete  03/02/2013  *RADIOLOGY REPORT*  Clinical Data:  Abdominal pain.  ABDOMEN ULTRASOUND  Technique:  Complete abdominal ultrasound examination was performed including evaluation of the liver, gallbladder, bile ducts, pancreas, kidneys, spleen, IVC, and abdominal aorta.  Comparison:  CT of the abdomen on 12/17/2007.  Findings:  Gallbladder:  Multiple mobile calculi are identified in the gallbladder.  There is no evidence of overt gallbladder wall thickening, pericholecystic fluid or sonographic Murphy's sign.  Common Bile Duct:  Maximal diameter of 7 mm.  Liver:  The liver shows diffusely increased echogenicity and some areas of geographic lower echogenicity.  Findings are most likely consistent with steatosis with areas of fatty sparing.  No focal mass or intrahepatic biliary ductal dilatation is identified.  IVC:  Patent throughout its visualized  course in the abdomen.  Pancreas:  Although the pancreas is difficult to visualize in its entirety, no focal pancreatic abnormality is identified.  Spleen:  The spleen is of normal echotexture and size.  Kidneys:  The right kidney measures approximately 11.8 cm and the left kidney 12.6 cm. There is suggestion of mild hydronephrosis of the right kidney.  A simple cyst measures 2.0 cm.  The left kidney demonstrates some prominence of the lower pole collecting system without overt significant hydronephrosis.  Simple cyst of the lower left kidney measures 1.5 cm.  Abdominal Aorta:  Normal caliber visualized abdominal aorta.  IMPRESSION:  1.  Cholelithiasis with multiple gallstones identified.  There is no overt evidence of cholecystitis or biliary obstruction. 2.  Hepatic steatosis with areas of fatty sparing. 3.  Suggestion of mild hydronephrosis of the right kidney and mild prominence of the lower pole collecting system of the left kidney. The patient does have a history of renal calculi.   Original Report Authenticated By: Irish Lack, M.D.    I personally reviewed the imaging tests through PACS system I reviewed available ER/hospitalization records through the EMR    1. Biliary colic   2. elevated liver function tests    MDM  Symptoms sound like biliary colic.  Continues to be tenderness right upper quadrant.  Pain meds given.  Elevated liver function tests  12:44 PM Spoke with general surgery  Who will evaluate the patient the bedside as he continues to have tenderness in the right upper quadrant.  White count daily 12,000.  N.p.o.      Lyanne Co, MD 03/02/13 1250

## 2013-03-02 NOTE — Progress Notes (Signed)
  Subjective:    Patient ID: [name], male    DOB: 1950/12/13, 62 y.o.   MRN: [id number]  HPI Patient reports sudden onset of left upper quadrant abdominal pain. He had bowel movement this morning. However his abdomen is becoming exquisitely tender. He increased amount of belching. He abdominal distention. He denies any melena or hematochezia. He denies any fever. Last week he has had a viral stomach flu.  He denies any chest pain or shortness of breath. He rates the pain as 7 on a scale of 1-10 Past Medical History  Diagnosis Date  . ADD (attention deficit disorder)   . HLD (hyperlipidemia)   . Hypogonadism male    History   Social History  . Marital Status: Married    Spouse Name: N/A    Number of Children: N/A  . Years of Education: N/A   Occupational History  . Not on file.   Social History Main Topics  . Smoking status: Never Smoker   . Smokeless tobacco: Not on file  . Alcohol Use: Not on file  . Drug Use: Not on file  . Sexually Active: Not on file   Other Topics Concern  . Not on file   Social History Narrative  . No narrative on file   Current outpatient prescriptions:pravastatin (PRAVACHOL) 40 MG tablet, Take 40 mg by mouth daily., Disp: , Rfl:      Review of Systems Review of systems is otherwise negative.    Objective:   Physical Exam  Constitutional: He appears distressed.  HENT:  Head: Normocephalic.  Eyes: Conjunctivae are normal. Pupils are equal, round, and reactive to light.  Neck: Normal range of motion. Neck supple.  Cardiovascular: Normal rate and normal heart sounds.   No murmur heard. Pulmonary/Chest: Effort normal and breath sounds normal.  Abdominal: He exhibits distension. There is tenderness. There is rebound and guarding.  Lymphadenopathy:    He has no cervical adenopathy.   EKG here shows normal sinus rhythm with a heart rate of 83 beats per minute with normal intervals and normal axis. There is no evidence of ischemia or  infarction. The patient has no ST segment elevation.        Assessment & Plan:  Abdominal  pain, other specified site  Patient has an acute abdomen today on exam.  He would not let me press on his abdomen without severe pain. He has absent bowel sounds.  I feel either has a small bowel obstruction or evolving appendicitis, or another cause of an acute abdomen. He still has his appendix is long with his gallbladder.  Recommended ER evaluation for urgent CAT scan of the abdomen and pelvis to evaluate for causes of an acute abdomen. Patient was referred to the emergency room. He declined EMS transport.  The emergency room at [facility] was notified.

## 2013-03-02 NOTE — ED Notes (Signed)
Pt from Doctors office, c/o abd pain. Sudden onset this AM, Pt has recent stomach upset x 2 days. Denies n/v cp,sob, radiation, or injury

## 2013-03-03 ENCOUNTER — Encounter (HOSPITAL_COMMUNITY): Payer: Self-pay | Admitting: Gastroenterology

## 2013-03-03 ENCOUNTER — Encounter (HOSPITAL_COMMUNITY)
Admission: EM | Disposition: A | Discharge status: Home / Self Care | Payer: Self-pay | Source: Home / Self Care | Attending: General Surgery

## 2013-03-03 ENCOUNTER — Inpatient Hospital Stay (HOSPITAL_COMMUNITY): Payer: 59

## 2013-03-03 HISTORY — PX: ERCP: SHX5425

## 2013-03-03 LAB — CBC
HCT: 41.8 % (ref 39.0–52.0)
MCH: 28.2 pg (ref 26.0–34.0)
MCHC: 34 g/dL (ref 30.0–36.0)
MCV: 82.9 fL (ref 78.0–100.0)
Platelets: 150 10*3/uL (ref 150–400)
RDW: 13.8 % (ref 11.5–15.5)
WBC: 5.5 10*3/uL (ref 4.0–10.5)

## 2013-03-03 LAB — COMPREHENSIVE METABOLIC PANEL
ALT: 797 U/L — ABNORMAL HIGH (ref 0–53)
Albumin: 3.1 g/dL — ABNORMAL LOW (ref 3.5–5.2)
Alkaline Phosphatase: 200 U/L — ABNORMAL HIGH (ref 39–117)
BUN: 8 mg/dL (ref 6–23)
Chloride: 101 mEq/L (ref 96–112)
Potassium: 4.2 mEq/L (ref 3.5–5.1)
Sodium: 136 mEq/L (ref 135–145)
Total Bilirubin: 4.3 mg/dL — ABNORMAL HIGH (ref 0.3–1.2)
Total Protein: 6.4 g/dL (ref 6.0–8.3)

## 2013-03-03 SURGERY — ERCP, WITH INTERVENTION IF INDICATED
Anesthesia: Moderate Sedation

## 2013-03-03 MED ORDER — DIPHENHYDRAMINE HCL 50 MG/ML IJ SOLN
INTRAMUSCULAR | Status: DC | PRN
  Administered 2013-03-03 (×2): 25 mg via INTRAVENOUS

## 2013-03-03 MED ORDER — MIDAZOLAM HCL 5 MG/ML IJ SOLN
INTRAMUSCULAR | Status: AC
  Filled 2013-03-03: qty 4

## 2013-03-03 MED ORDER — FENTANYL CITRATE 0.05 MG/ML IJ SOLN
INTRAMUSCULAR | Status: DC | PRN
  Administered 2013-03-03 (×7): 25 ug via INTRAVENOUS

## 2013-03-03 MED ORDER — GLUCAGON HCL (RDNA) 1 MG IJ SOLR
INTRAMUSCULAR | Status: AC
  Filled 2013-03-03: qty 1

## 2013-03-03 MED ORDER — BUTAMBEN-TETRACAINE-BENZOCAINE 2-2-14 % EX AERO
INHALATION_SPRAY | CUTANEOUS | Status: DC | PRN
  Administered 2013-03-03: 2 via TOPICAL

## 2013-03-03 MED ORDER — GLUCAGON HCL (RDNA) 1 MG IJ SOLR
INTRAMUSCULAR | Status: DC | PRN
  Administered 2013-03-03 (×4): 1 mg via INTRAVENOUS

## 2013-03-03 MED ORDER — DIPHENHYDRAMINE HCL 50 MG/ML IJ SOLN
INTRAMUSCULAR | Status: AC
  Filled 2013-03-03: qty 1

## 2013-03-03 MED ORDER — EPINEPHRINE HCL 1 MG/ML IJ SOLN
INTRAMUSCULAR | Status: DC | PRN
  Administered 2013-03-03: 14:00:00

## 2013-03-03 MED ORDER — MIDAZOLAM HCL 10 MG/2ML IJ SOLN
INTRAMUSCULAR | Status: DC | PRN
  Administered 2013-03-03: 2 mg via INTRAVENOUS
  Administered 2013-03-03: 1 mg via INTRAVENOUS
  Administered 2013-03-03 (×7): 2 mg via INTRAVENOUS

## 2013-03-03 MED ORDER — GLUCAGON HCL (RDNA) 1 MG IJ SOLR
INTRAMUSCULAR | Status: AC
  Filled 2013-03-03: qty 2

## 2013-03-03 MED ORDER — FENTANYL CITRATE 0.05 MG/ML IJ SOLN
INTRAMUSCULAR | Status: AC
  Filled 2013-03-03: qty 4

## 2013-03-03 NOTE — Op Note (Addendum)
[facility] [address] [place], [address]   ERCP PROCEDURE REPORT  PATIENT: [name], [name].  MR# :[id number] BIRTHDATE: [birth year]  GENDER: Male ENDOSCOPIST: Willis Modena, MD REFERRED BY: Jimmye Norman, M.D. PROCEDURE DATE:  03/03/2013 PROCEDURE:   ERCP with sphincterotomy/papillotomy ASA CLASS:    Class II INDICATIONS: Elevated LFTs, abdominal pain, gallstones MEDICATIONS:    Unasyn IV (given pre-procedure); glucagon 4mg  IV; fentanyl 175 mcg IV; versed 17 mg IV; benadryl 50 mg IV TOPICAL ANESTHETIC:  Cetacaine spray x 2  DESCRIPTION OF PROCEDURE:   After the risks benefits and alternatives of the procedure were thoroughly explained, informed consent was obtained.  The [id number] ([id number])  endoscope was introduced through the mouth and advanced to the second portion of the duodenum .    FINDINGS:  Markedly J-shaped stomach.  Ampulla ultimately reached; biliary orifice was widely patent with fish-mouth type appearance consistent with recent stone passage.  Good bile flow per ampulla pre- and post-procedure.  Deep biliary access obtained.  Bile duct about 5mm.  No obvious filling defects seen.  Based on clinical history, moderate biliary sphincterotomy was performed with blended current.  Duct was trawled a couple times; no stones were extruded. Pancreatogram was not obtained, intentionally.  ENDOSCOPIC IMPRESSION:  Normal cholangiogram.  Biliary sphincterotomy performed.  No choledocholithiasis seen.  RECOMMENDATIONS: 1.  Watch for potential complications of procedure. 2.  Avoid ASA/NSAIDs post-sphincterotomy. 3.  Clear liquid diet. 4.  Possible cholecystectomy tomorrow by surgical team, if no complications from today's procedure. 5.  Will follow.     _______________________________ Rosalie DoctorWillis Modena, MD 03/03/2013 2:02 PM   CC:

## 2013-03-03 NOTE — Progress Notes (Signed)
Will have surgery tomorrow.  [name]. [name], MD, FACS [phone] [facility]

## 2013-03-03 NOTE — Progress Notes (Signed)
Patient's allergies show allergy to Valium. Per MD request, Versed being given for sedation.

## 2013-03-03 NOTE — Progress Notes (Signed)
Subjective: Pain is still severe today.  Pt is NPO, ambulating some.  Passing flatus and BM's.    Objective: Vital signs in last 24 hours: Temp:  [97.4 F (36.3 C)-98.9 F (37.2 C)] 98.5 F (36.9 C) (04/22 0511) Pulse Rate:  [70-95] 70 (04/22 0511) Resp:  [12-20] 18 (04/22 0511) BP: (110-142)/(62-93) 110/69 mmHg (04/22 0511) SpO2:  [94 %-99 %] 94 % (04/22 0511) Weight:  [188 lb (85.276 kg)] 188 lb (85.276 kg) (04/21 1557) Last BM Date: 03/02/13  Intake/Output from previous day: 04/21 0701 - 04/22 0700 In: 1764.2 [I.V.:1464.2; IV Piggyback:300] Out: -  Intake/Output this shift:    PE: Gen:  Alert, NAD, pleasant Abd: Soft, exquisitely tender in epigastrium, +BS, no HSM   Lab Results:   Recent Labs  03/02/13 1040 03/03/13 0550  WBC 11.6* 5.5  HGB 16.7 14.2  HCT 46.5 41.8  PLT 167 150   BMET  Recent Labs  03/02/13 1040 03/03/13 0550  NA 134* 136  K 4.0 4.2  CL 98 101  CO2 26 28  GLUCOSE 120* 109*  BUN 13 8  CREATININE 0.89 0.91  CALCIUM 9.2 8.6   PT/INR No results found for this basename: LABPROT, INR,  in the last 72 hours CMP     Component Value Date/Time   NA 136 03/03/2013 0550   K 4.2 03/03/2013 0550   CL 101 03/03/2013 0550   CO2 28 03/03/2013 0550   GLUCOSE 109* 03/03/2013 0550   BUN 8 03/03/2013 0550   CREATININE 0.91 03/03/2013 0550   CALCIUM 8.6 03/03/2013 0550   PROT 6.4 03/03/2013 0550   ALBUMIN 3.1* 03/03/2013 0550   AST 634* 03/03/2013 0550   ALT 797* 03/03/2013 0550   ALKPHOS 200* 03/03/2013 0550   BILITOT 4.3* 03/03/2013 0550   GFRNONAA >90 03/03/2013 0550   GFRAA >90 03/03/2013 0550   Lipase     Component Value Date/Time   LIPASE 34 03/02/2013 1040       Studies/Results: US Abdomen Complete  03/02/2013  *RADIOLOGY REPORT*  Clinical Data:  Abdominal pain.  ABDOMEN ULTRASOUND  Technique:  Complete abdominal ultrasound examination was performed including evaluation of the liver, gallbladder, bile ducts, pancreas, kidneys, spleen,  IVC, and abdominal aorta.  Comparison:  CT of the abdomen on 12/17/2007.  Findings:  Gallbladder:  Multiple mobile calculi are identified in the gallbladder.  There is no evidence of overt gallbladder wall thickening, pericholecystic fluid or sonographic Murphy's sign.  Common Bile Duct:  Maximal diameter of 7 mm.  Liver:  The liver shows diffusely increased echogenicity and some areas of geographic lower echogenicity.  Findings are most likely consistent with steatosis with areas of fatty sparing.  No focal mass or intrahepatic biliary ductal dilatation is identified.  IVC:  Patent throughout its visualized course in the abdomen.  Pancreas:  Although the pancreas is difficult to visualize in its entirety, no focal pancreatic abnormality is identified.  Spleen:  The spleen is of normal echotexture and size.  Kidneys:  The right kidney measures approximately 11.8 cm and the left kidney 12.6 cm. There is suggestion of mild hydronephrosis of the right kidney.  A simple cyst measures 2.0 cm.  The left kidney demonstrates some prominence of the lower pole collecting system without overt significant hydronephrosis.  Simple cyst of the lower left kidney measures 1.5 cm.  Abdominal Aorta:  Normal caliber visualized abdominal aorta.  IMPRESSION:  1.  Cholelithiasis with multiple gallstones identified.  There is no overt evidence of cholecystitis  or biliary obstruction. 2.  Hepatic steatosis with areas of fatty sparing. 3.  Suggestion of mild hydronephrosis of the right kidney and mild prominence of the lower pole collecting system of the left kidney. The patient does have a history of renal calculi.   Original Report Authenticated By: Irish Lack, M.D.     Anti-infectives: Anti-infectives   Start     Dose/Rate Route Frequency Ordered Stop   03/02/13 1630  Ampicillin-Sulbactam (UNASYN) 3 g in sodium chloride 0.9 % 100 mL IVPB     3 g 100 mL/hr over 60 Minutes Intravenous Every 6 hours 03/02/13 1622          Assessment/Plan 1. Biliary colic, ? Choledocholithiasis   IVF, pain control, antiemetics, antibiotics  Ambulation and IS  Called Dr. Dulce Sellar back for official consult  Can have clears if no procedure is planned for today 2. Transaminitis, hyperbilirubinemia   Trending up, talked to Dr. Dulce Sellar who will schedule him for an ERCP, unknown timing; plan for surgery the following day after  ERCP 3. ADD  4. Hypercholesterolemia  VTE - lovenox - if no procedure today, scd's     LOS: 1 day    [name], [name] 03/03/2013, 7:55 AM Pager: [phone]

## 2013-03-03 NOTE — Interval H&P Note (Signed)
History and Physical Interval Note:  03/03/2013 12:16 PM  [name]  has presented today for surgery, with the diagnosis of Abdominal pain, gallstones, elevated LFTs.  The various methods of treatment have been discussed with the patient and family. After consideration of risks, benefits and other options for treatment, the patient has consented to  Procedure(s) with comments: ENDOSCOPIC RETROGRADE CHOLANGIOPANCREATOGRAPHY (ERCP) (N/A) - prone positioning as a surgical intervention .  The patient's history has been reviewed, patient examined, no change in status, stable for surgery.  I have reviewed the patient's chart and labs.  Questions were answered to the patient's satisfaction.     Ferrel Simington,Liliana M  Assessment:  1.   Upper abdominal pain with gallstones. 2.   Elevated LFTs, worsening.  Suspect choledocholithiasis.  Lipase normal.  Plan:  1.  ERCP with possible biliary sphincterotomy and bile duct stone extraction. 2.  Risks (up to and including bleeding, infection, perforation, pancreatitis that can be complicated by infected necrosis and death), benefits (removal of stones, alleviating blockage, decreasing risk of cholangitis or choledocholithiasis-related pancreatitis), and alternatives (watchful waiting, percutaneous transhepatic cholangiography) of ERCP were explained to patient in detail and patient elects to proceed.

## 2013-03-04 ENCOUNTER — Encounter (HOSPITAL_COMMUNITY): Payer: Self-pay | Admitting: Certified Registered Nurse Anesthetist

## 2013-03-04 ENCOUNTER — Inpatient Hospital Stay (HOSPITAL_COMMUNITY): Payer: 59 | Admitting: Certified Registered Nurse Anesthetist

## 2013-03-04 ENCOUNTER — Inpatient Hospital Stay (HOSPITAL_COMMUNITY): Payer: 59

## 2013-03-04 ENCOUNTER — Encounter (HOSPITAL_COMMUNITY)
Admission: EM | Disposition: A | Discharge status: Home / Self Care | Payer: Self-pay | Source: Home / Self Care | Attending: General Surgery

## 2013-03-04 ENCOUNTER — Encounter (HOSPITAL_COMMUNITY): Payer: Self-pay | Admitting: Gastroenterology

## 2013-03-04 HISTORY — PX: CHOLECYSTECTOMY: SHX55

## 2013-03-04 LAB — SURGICAL PCR SCREEN
MRSA, PCR: NEGATIVE
Staphylococcus aureus: NEGATIVE

## 2013-03-04 SURGERY — LAPAROSCOPIC CHOLECYSTECTOMY WITH INTRAOPERATIVE CHOLANGIOGRAM
Anesthesia: General | Site: Abdomen | Wound class: Contaminated

## 2013-03-04 MED ORDER — OXYCODONE-ACETAMINOPHEN 5-325 MG PO TABS
1.0000 | ORAL_TABLET | ORAL | Status: DC | PRN
  Administered 2013-03-05: 2 via ORAL
  Filled 2013-03-04: qty 2

## 2013-03-04 MED ORDER — ONDANSETRON HCL 4 MG/2ML IJ SOLN
INTRAMUSCULAR | Status: DC | PRN
  Administered 2013-03-04: 4 mg via INTRAVENOUS

## 2013-03-04 MED ORDER — PROPOFOL 10 MG/ML IV BOLUS
INTRAVENOUS | Status: DC | PRN
  Administered 2013-03-04: 150 mg via INTRAVENOUS
  Administered 2013-03-04: 50 mg via INTRAVENOUS

## 2013-03-04 MED ORDER — SODIUM CHLORIDE 0.9 % IV SOLN
INTRAVENOUS | Status: DC | PRN
  Administered 2013-03-04: 13:00:00

## 2013-03-04 MED ORDER — HYDROMORPHONE HCL PF 1 MG/ML IJ SOLN
INTRAMUSCULAR | Status: AC
  Filled 2013-03-04: qty 1

## 2013-03-04 MED ORDER — NEOSTIGMINE METHYLSULFATE 1 MG/ML IJ SOLN
INTRAMUSCULAR | Status: DC | PRN
  Administered 2013-03-04: 4 mg via INTRAVENOUS

## 2013-03-04 MED ORDER — KCL IN DEXTROSE-NACL 20-5-0.45 MEQ/L-%-% IV SOLN
INTRAVENOUS | Status: DC
  Administered 2013-03-04: 100 mL via INTRAVENOUS
  Administered 2013-03-05: 02:00:00 via INTRAVENOUS
  Filled 2013-03-04 (×5): qty 1000

## 2013-03-04 MED ORDER — OXYCODONE HCL 5 MG PO TABS: 5.0000 mg | ORAL_TABLET | Freq: Once | ORAL | Status: DC | PRN

## 2013-03-04 MED ORDER — SODIUM CHLORIDE 0.9 % IR SOLN
Status: DC | PRN
  Administered 2013-03-04: 1000 mL

## 2013-03-04 MED ORDER — HYDROMORPHONE HCL PF 1 MG/ML IJ SOLN
0.2500 mg | INTRAMUSCULAR | Status: DC | PRN
  Administered 2013-03-04: 1 mg via INTRAVENOUS
  Administered 2013-03-04 (×2): 0.5 mg via INTRAVENOUS

## 2013-03-04 MED ORDER — LACTATED RINGERS IV SOLN
INTRAVENOUS | Status: DC | PRN
  Administered 2013-03-04 (×2): via INTRAVENOUS

## 2013-03-04 MED ORDER — ROCURONIUM BROMIDE 100 MG/10ML IV SOLN
INTRAVENOUS | Status: DC | PRN
  Administered 2013-03-04: 5 mg via INTRAVENOUS
  Administered 2013-03-04: 10 mg via INTRAVENOUS
  Administered 2013-03-04: 50 mg via INTRAVENOUS

## 2013-03-04 MED ORDER — FENTANYL CITRATE 0.05 MG/ML IJ SOLN
INTRAMUSCULAR | Status: DC | PRN
  Administered 2013-03-04: 100 ug via INTRAVENOUS
  Administered 2013-03-04 (×2): 50 ug via INTRAVENOUS
  Administered 2013-03-04: 100 ug via INTRAVENOUS

## 2013-03-04 MED ORDER — HYDRALAZINE HCL 20 MG/ML IJ SOLN
INTRAMUSCULAR | Status: DC | PRN
  Administered 2013-03-04 (×2): 2.5 mg via INTRAVENOUS

## 2013-03-04 MED ORDER — BUPIVACAINE-EPINEPHRINE PF 0.25-1:200000 % IJ SOLN
INTRAMUSCULAR | Status: AC
  Filled 2013-03-04: qty 30

## 2013-03-04 MED ORDER — SODIUM CHLORIDE 0.9 % IR SOLN
Status: DC | PRN
  Administered 2013-03-04: 3000 mL

## 2013-03-04 MED ORDER — MEPERIDINE HCL 25 MG/ML IJ SOLN: 6.2500 mg | INTRAMUSCULAR | Status: DC | PRN

## 2013-03-04 MED ORDER — LACTATED RINGERS IV SOLN
INTRAVENOUS | Status: DC
  Administered 2013-03-04: 12:00:00 via INTRAVENOUS

## 2013-03-04 MED ORDER — OXYCODONE-ACETAMINOPHEN 5-325 MG PO TABS
ORAL_TABLET | ORAL | Status: AC
  Administered 2013-03-04: 2
  Filled 2013-03-04: qty 2

## 2013-03-04 MED ORDER — ARTIFICIAL TEARS OP OINT
TOPICAL_OINTMENT | OPHTHALMIC | Status: DC | PRN
  Administered 2013-03-04: 1 via OPHTHALMIC

## 2013-03-04 MED ORDER — SIMVASTATIN 20 MG PO TABS
20.0000 mg | ORAL_TABLET | Freq: Every day | ORAL | Status: DC
  Administered 2013-03-04: 20 mg via ORAL
  Filled 2013-03-04 (×2): qty 1

## 2013-03-04 MED ORDER — MIDAZOLAM HCL 5 MG/5ML IJ SOLN
INTRAMUSCULAR | Status: DC | PRN
  Administered 2013-03-04: 2 mg via INTRAVENOUS

## 2013-03-04 MED ORDER — LIDOCAINE HCL (CARDIAC) 20 MG/ML IV SOLN
INTRAVENOUS | Status: DC | PRN
  Administered 2013-03-04: 60 mg via INTRAVENOUS

## 2013-03-04 MED ORDER — BUPIVACAINE-EPINEPHRINE 0.25% -1:200000 IJ SOLN
INTRAMUSCULAR | Status: DC | PRN
  Administered 2013-03-04: 30 mL

## 2013-03-04 MED ORDER — METHYLPHENIDATE HCL 5 MG PO TABS: 20.0000 mg | ORAL_TABLET | Freq: Every day | ORAL | Status: DC | PRN

## 2013-03-04 MED ORDER — OXYCODONE HCL 5 MG/5ML PO SOLN: 5.0000 mg | Freq: Once | ORAL | Status: DC | PRN

## 2013-03-04 MED ORDER — GLYCOPYRROLATE 0.2 MG/ML IJ SOLN
INTRAMUSCULAR | Status: DC | PRN
  Administered 2013-03-04: 0.6 mg via INTRAVENOUS

## 2013-03-04 SURGICAL SUPPLY — 53 items
ADH SKN CLS APL DERMABOND .7 (GAUZE/BANDAGES/DRESSINGS) ×1
APPLIER CLIP 5 13 M/L LIGAMAX5 (MISCELLANEOUS) ×2
APPLIER CLIP ROT 10 11.4 M/L (STAPLE)
APR CLP MED LRG 11.4X10 (STAPLE)
APR CLP MED LRG 5 ANG JAW (MISCELLANEOUS) ×1
BAG SPEC RTRVL LRG 6X4 10 (ENDOMECHANICALS) ×1
BLADE SURG ROTATE 9660 (MISCELLANEOUS) ×1 IMPLANT
CANISTER SUCTION 2500CC (MISCELLANEOUS) ×2 IMPLANT
CHLORAPREP W/TINT 26ML (MISCELLANEOUS) ×2 IMPLANT
CLIP APPLIE 5 13 M/L LIGAMAX5 (MISCELLANEOUS) ×1 IMPLANT
CLIP APPLIE ROT 10 11.4 M/L (STAPLE) IMPLANT
CLOTH BEACON ORANGE TIMEOUT ST (SAFETY) ×2 IMPLANT
CLSR STERI-STRIP ANTIMIC 1/2X4 (GAUZE/BANDAGES/DRESSINGS) ×1 IMPLANT
COVER MAYO STAND STRL (DRAPES) ×2 IMPLANT
COVER SURGICAL LIGHT HANDLE (MISCELLANEOUS) ×2 IMPLANT
DECANTER SPIKE VIAL GLASS SM (MISCELLANEOUS) ×2 IMPLANT
DERMABOND ADVANCED (GAUZE/BANDAGES/DRESSINGS) ×1
DERMABOND ADVANCED .7 DNX12 (GAUZE/BANDAGES/DRESSINGS) ×1 IMPLANT
DRAPE C-ARM 42X72 X-RAY (DRAPES) ×2 IMPLANT
DRAPE UTILITY 15X26 W/TAPE STR (DRAPE) ×4 IMPLANT
ELECT REM PT RETURN 9FT ADLT (ELECTROSURGICAL) ×2
ELECTRODE REM PT RTRN 9FT ADLT (ELECTROSURGICAL) ×1 IMPLANT
ENDOLOOP SUT PDS II  0 18 (SUTURE) ×1
ENDOLOOP SUT PDS II 0 18 (SUTURE) IMPLANT
GLOVE BIO SURGEON STRL SZ7 (GLOVE) ×1 IMPLANT
GLOVE BIOGEL PI IND STRL 6.5 (GLOVE) ×1 IMPLANT
GLOVE BIOGEL PI IND STRL 7.0 (GLOVE) IMPLANT
GLOVE BIOGEL PI IND STRL 8 (GLOVE) ×1 IMPLANT
GLOVE BIOGEL PI INDICATOR 6.5 (GLOVE) ×1
GLOVE BIOGEL PI INDICATOR 7.0 (GLOVE) ×2
GLOVE BIOGEL PI INDICATOR 8 (GLOVE) ×1
GLOVE ECLIPSE 7.5 STRL STRAW (GLOVE) ×3 IMPLANT
GLOVE SS BIOGEL STRL SZ 6.5 (GLOVE) IMPLANT
GLOVE SUPERSENSE BIOGEL SZ 6.5 (GLOVE) ×1
GOWN STRL NON-REIN LRG LVL3 (GOWN DISPOSABLE) ×6 IMPLANT
KIT BASIN OR (CUSTOM PROCEDURE TRAY) ×2 IMPLANT
KIT ROOM TURNOVER OR (KITS) ×2 IMPLANT
NS IRRIG 1000ML POUR BTL (IV SOLUTION) ×2 IMPLANT
PAD ARMBOARD 7.5X6 YLW CONV (MISCELLANEOUS) ×4 IMPLANT
POUCH SPECIMEN RETRIEVAL 10MM (ENDOMECHANICALS) ×1 IMPLANT
SCISSORS LAP 5X35 DISP (ENDOMECHANICALS) IMPLANT
SET CHOLANGIOGRAPH 5 50 .035 (SET/KITS/TRAYS/PACK) ×2 IMPLANT
SET IRRIG TUBING LAPAROSCOPIC (IRRIGATION / IRRIGATOR) ×2 IMPLANT
SLEEVE ENDOPATH XCEL 5M (ENDOMECHANICALS) ×4 IMPLANT
SPECIMEN JAR SMALL (MISCELLANEOUS) ×2 IMPLANT
SUT MNCRL AB 4-0 PS2 18 (SUTURE) ×2 IMPLANT
TOWEL OR 17X24 6PK STRL BLUE (TOWEL DISPOSABLE) ×2 IMPLANT
TOWEL OR 17X26 10 PK STRL BLUE (TOWEL DISPOSABLE) ×2 IMPLANT
TRAY LAPAROSCOPIC (CUSTOM PROCEDURE TRAY) ×2 IMPLANT
TROCAR XCEL BLUNT TIP 100MML (ENDOMECHANICALS) ×2 IMPLANT
TROCAR XCEL NON-BLD 11X100MML (ENDOMECHANICALS) ×1 IMPLANT
TROCAR XCEL NON-BLD 5MMX100MML (ENDOMECHANICALS) ×2 IMPLANT
WATER STERILE IRR 1000ML POUR (IV SOLUTION) IMPLANT

## 2013-03-04 NOTE — Transfer of Care (Signed)
Immediate Anesthesia Transfer of Care Note  Patient: [name]  Procedure(s) Performed: Procedure(s): LAPAROSCOPIC CHOLECYSTECTOMY WITH INTRAOPERATIVE CHOLANGIOGRAM (N/A)  Patient Location: PACU  Anesthesia Type:General  Level of Consciousness: awake, alert  and oriented  Airway & Oxygen Therapy: Patient Spontanous Breathing  Post-op Assessment: Report given to PACU RN, Post -op Vital signs reviewed and stable and Patient moving all extremities X 4  Post vital signs: Reviewed and stable  Complications: No apparent anesthesia complications

## 2013-03-04 NOTE — Anesthesia Preprocedure Evaluation (Addendum)
Anesthesia Evaluation  Patient identified by MRN, date of birth, ID band Patient awake    History of Anesthesia Complications Negative for: history of anesthetic complications  Airway Mallampati: II      Dental  (+) Teeth Intact and Dental Advisory Given   Pulmonary neg pulmonary ROS,  breath sounds clear to auscultation        Cardiovascular negative cardio ROS  Rhythm:Regular Rate:Normal     Neuro/Psych negative neurological ROS     GI/Hepatic negative GI ROS, Neg liver ROS,   Endo/Other  negative endocrine ROS  Renal/GU negative Renal ROS     Musculoskeletal   Abdominal   Peds  Hematology   Anesthesia Other Findings   Reproductive/Obstetrics                         Anesthesia Physical Anesthesia Plan  ASA: I  Anesthesia Plan: General   Post-op Pain Management:    Induction:   Airway Management Planned: Oral ETT  Additional Equipment:   Intra-op Plan:   Post-operative Plan: Extubation in OR  Informed Consent: I have reviewed the patients History and Physical, chart, labs and discussed the procedure including the risks, benefits and alternatives for the proposed anesthesia with the patient or authorized representative who has indicated his/her understanding and acceptance.   Dental advisory given  Plan Discussed with: Surgeon and CRNA  Anesthesia Plan Comments:        Anesthesia Quick Evaluation

## 2013-03-04 NOTE — Progress Notes (Signed)
Patient had ERCP yesterday, sphincterotomy was performed, but no stones were found.  For lap chole today.  [name]. [name], MD, FACS [phone] [facility] Surgery

## 2013-03-04 NOTE — Anesthesia Procedure Notes (Addendum)
Procedure Name: Intubation Date/Time: 03/04/2013 12:16 PM Performed by: Sherie Don Pre-anesthesia Checklist: Patient identified, Emergency Drugs available, Suction available, Patient being monitored and Timeout performed Patient Re-evaluated:Patient Re-evaluated prior to inductionOxygen Delivery Method: Circle system utilized Preoxygenation: Pre-oxygenation with 100% oxygen Intubation Type: IV induction Ventilation: Mask ventilation without difficulty Laryngoscope Size: Miller and 2 Grade View: Grade I Tube type: Oral Tube size: 7.5 mm Number of attempts: 1 Airway Equipment and Method: Stylet Placement Confirmation: ETT inserted through vocal cords under direct vision,  positive ETCO2 and breath sounds checked- equal and bilateral Secured at: 23 cm Tube secured with: Tape Dental Injury: Teeth and Oropharynx as per pre-operative assessment     Performed by: Bishop Limbo

## 2013-03-04 NOTE — Op Note (Signed)
OPERATIVE REPORT  DATE OF OPERATION: 03/02/2013 - 03/04/2013  PATIENT:  [name]  62 y.o. male  PRE-OPERATIVE DIAGNOSIS:  Gallstones  POST-OPERATIVE DIAGNOSIS:  Gallstones  PROCEDURE:  Procedure(s): LAPAROSCOPIC CHOLECYSTECTOMY WITH INTRAOPERATIVE CHOLANGIOGRAM  SURGEON:  Surgeon(s): Cherylynn Ridges, MD  ASSISTANT: None  ANESTHESIA:   general  EBL: 150 ml  BLOOD ADMINISTERED: none  DRAINS: none   SPECIMEN:  Source of Specimen:  Gallbladder and stones  COUNTS CORRECT:  YES  PROCEDURE DETAILS: The patient was taken to the operating room and placed on the table in the supine position.  After an adequate endotracheal anesthetic was administered, he was prepped with ChloroPrep, and then draped in the usual manner exposing the entire abdomen laterally, inferiorly and up  to the costal margins.  After a proper timeout was performed including identifying the patient and the procedure to be performed, a supra-umbilical 1.5cm midline incision was made using a #15 blade.  This was taken down to the fascia which was then incised with a #15 blade.  The edges of the fascia were tented up with Kocher clamps as the preperitoneal space was penetrated with a Kelly clamp into the peritoneum.  Once this was done, a pursestring suture of 0 Vicryl was passed around the fascial opening.  This was subsequently used to secure the [facility] cannula which was passed into the peritoneal cavity.  Once the [facility] cannula was in place, carbon dioxide gas was insufflated into the peritoneal cavity up to a maximal intra-abdominal pressure of 15mm Hg.The laparoscope, with attached camera and light source, was passed into the peritoneal cavity to visualize the direct insertion of two right upper quadrant 5mm cannulas, and a sup-xiphoid 10mm cannula.  Once all cannulas were in place, the dissection was begun.  The gallbladder was acutely inflamed and taut.  We aspirated the gallbladder with a Najhat aspirator,  then retracted it to the RUQ.  During this process the liver capsule tore slightly to the left of the gallbladder fossa which required electrocautery control at the end of the case.  Two ratcheted graspers were attached to the dome and infundibulum of the gallbladder and retracted towards the anterior abdominal wall and the right upper quadrant.  Using cautery attached to a dissecting forceps, the peritoneum overlaying the triangle of Chalot and the hepatoduodenal triangle was dissected away exposing the cystic duct and the cystic artery.  A clip was placed on the gallbladder side of the cystic duct, then a cholecytodochotomy made using the laparoscopic scissors.  Through the cholecystodochotomy a Cook catheter was passed to performed a cholangiogram.  The cholangiogram showed Good flow into the duodenum, good proximal filling, no intra-ductal fillin defects, and no dilatation..  Once the cholangiogram was completed, the [facility] catheter was removed, and the distal cystic duct was controlled with two EndoLoops.  It was too thick for clip application  The gallbladder was then dissected out of the hepatic bed with some bleeding which was controlled.  It was retrieved from the abdomen (using an EndoCatch bag) without event.  Once the gallbladder was removed, the bed was inspected for hemostasis.  Once excellent hemostasis was obtained all gas and fluids were aspirated from above the liver, then the cannulas were removed.  The supra-umbilical incision was closed using the pursestring suture which was in place.  0.25% bupivicaine with epinephrine was injected at all sites.  All 10mm or greater cannula sites were close using a running subcuticular stitch of 4-0 Monocryl.  5.65mm cannula sites  were closed with Dermabond only.Steri-Strips and Tagaderm were used to complete the dressings at all sites.  At this point all needle, sponge, and instrument counts were correct.The patient was awakened from anesthesia and  taken to the PACU in stable condition.  PATIENT DISPOSITION:  PACU - hemodynamically stable.   Cherylynn Ridges 4/23/20142:12 PM

## 2013-03-04 NOTE — Progress Notes (Signed)
Subjective: No problems post-ERCP.  Objective: Vital signs in last 24 hours: Temp:  [97.8 F (36.6 C)-98.7 F (37.1 C)] 98.3 F (36.8 C) (04/23 0505) Pulse Rate:  [70-90] 79 (04/23 0505) Resp:  [9-44] 17 (04/23 0505) BP: (80-193)/(54-131) 118/78 mmHg (04/23 0505) SpO2:  [90 %-99 %] 95 % (04/23 0505) Weight change:  Last BM Date: 03/02/13  PE: GEN:  NAD HEENT:  Less jaundiced-appearing ABD:  Soft  Lab Results: No LFTs today yet available  Assessment:  1.  Symptomatic cholelithiasis. 2.  Elevated LFTs.  No choledocholithiasis seen on yesterday's ERCP. Biliary sphincterotomy performed.  Plan:  1.  Cholecystectomy planned for later today per [facility]. 2.  Will sign-off; please call with any questions; thank you for the consult.   [name], [name] 03/04/2013, 9:50 AM

## 2013-03-04 NOTE — Preoperative (Signed)
Beta Blockers   Reason not to administer Beta Blockers:Not Applicable 

## 2013-03-05 ENCOUNTER — Encounter (HOSPITAL_COMMUNITY): Payer: Self-pay | Admitting: General Surgery

## 2013-03-05 DIAGNOSIS — K8020 Calculus of gallbladder without cholecystitis without obstruction: Secondary | ICD-10-CM | POA: Diagnosis present

## 2013-03-05 LAB — COMPREHENSIVE METABOLIC PANEL
AST: 78 U/L — ABNORMAL HIGH (ref 0–37)
CO2: 26 mEq/L (ref 19–32)
Chloride: 103 mEq/L (ref 96–112)
Creatinine, Ser: 0.88 mg/dL (ref 0.50–1.35)
GFR calc Af Amer: >90 mL/min (ref >90)
GFR calc non Af Amer: >90 mL/min (ref >90)
Glucose, Bld: 160 mg/dL — ABNORMAL HIGH (ref 70–99)
Total Bilirubin: 0.8 mg/dL (ref 0.3–1.2)

## 2013-03-05 LAB — CBC
MCH: 29.6 pg (ref 26.0–34.0)
MCHC: 35 g/dL (ref 30.0–36.0)
Platelets: 155 10*3/uL (ref 150–400)
RBC: 4.46 MIL/uL (ref 4.22–5.81)

## 2013-03-05 MED ORDER — HYDROCODONE-ACETAMINOPHEN 10-325 MG PO TABS: 0.5000 | ORAL_TABLET | Freq: Four times a day (QID) | ORAL | Status: DC | PRN

## 2013-03-05 MED ORDER — OXYCODONE-ACETAMINOPHEN 5-325 MG PO TABS: 1.0000 | ORAL_TABLET | Freq: Four times a day (QID) | ORAL | Status: DC | PRN

## 2013-03-05 MED ORDER — HYDROMORPHONE HCL PF 1 MG/ML IJ SOLN
1.0000 mg | Freq: Once | INTRAMUSCULAR | Status: AC
  Administered 2013-03-05: 1 mg via INTRAVENOUS

## 2013-03-05 NOTE — Progress Notes (Signed)
Sleepy and sore.  No peritonitis.  Okay to go home.  [name]. [name], MD, FACS [phone] [facility] Surgery

## 2013-03-05 NOTE — Discharge Summary (Signed)
Physician Discharge Summary  Patient ID: [name] MRN: [id number] DOB/AGE: 1951-05-24 62 y.o.  Admit date: 03/02/2013 Discharge date: 03/05/2013  Admitting Diagnosis: Biliary Colic Transaminitis Hyperbilirubinemia ADD Hyperlipidemia  Discharge Diagnosis Patient Active Problem List   Diagnosis Date Noted  . Cholelithiasis 03/05/2013  . Biliary colic 03/02/2013  . Transaminitis 03/02/2013  . ADD (attention deficit disorder)   . HLD (hyperlipidemia)   . Hypogonadism male   . Nephrolithiasis     Consultants Dr. Dulce Sellar (Gastroenterology)  Imaging: Dg Cholangiogram Operative  03/04/2013  *RADIOLOGY REPORT*  Clinical Data:   62 year old male undergoing laparoscopic cholecystectomy.  Fluoroscopy time of 0-minute-7-seconds was utilized.  INTRAOPERATIVE CHOLANGIOGRAM  Technique:  Cholangiographic images from the C-arm fluoroscopic device were submitted for interpretation post-operatively.  Please see the procedural report for the amount of contrast and the fluoroscopy time utilized.  Comparison:  ERCP images 03/03/2013 and earlier.  Findings:  Surgical clips at the level of the cystic duct which is cannulated.  Contrast injection.  No intra or extra panic biliary ductal dilatation.  Prompt emptying of contrast to the duodenum. No filling defect or extravasation.  IMPRESSION: Negative intraoperative cholangiogram.   Original Report Authenticated By: Erskine Speed, M.D.    Dg Ercp Biliary & Pancreatic Ducts  03/03/2013  *RADIOLOGY REPORT*  Clinical Data: Common bile duct stones.  Cholelithiasis.  ERCP  Comparison:  Ultrasound dated 03/02/2013  Technique:  Multiple spot images obtained with the fluoroscopic device and submitted for interpretation post-procedure.  ERCP was performed by Dr. Dulce Sellar.  Findings: Multiple c-arm images from the ERCP demonstrate a guide wire and catheter in the common bile duct.  There are no definitive stones seen on the available images.  IMPRESSION: ERCP  performed by Dr. Dulce Sellar.  These images were submitted for radiologic interpretation only. Please see the procedural report for the amount of contrast and the fluoroscopy time utilized.   Original Report Authenticated By: Francene Boyers, M.D.     Procedures ERCP ([place]) 01/03/13 Laparoscopic cholecystectomy with IOC ([place]) 01/04/13  Hospital Course:  62 yo male who presented to the [facility] with multiple episodes of epigastric abdominal pain after eating over the last few days.  He has one episode of emesis, but no N/V upon admission.    Workup showed gallstones on ultrasound and elevated LFTs. His pain was persisting so he was admitted for further evaluation and workup.  On HD #2 he was found to have a large increase in his LFT's and bilirubin.  Dr. Dulce Sellar took him for an ERCP which was found to be normal.  On 03/04/13 he was cleared to proceed with a lap chole with IOC.  He tolerated procedure well and was transferred to the floor.  Diet was advanced as tolerated.  On POD #1/HD#4, the patient was voiding well, tolerating diet, ambulating well, pain well controlled, vital signs stable, incisions c/d/i and felt stable for discharge home.  Patient will follow up in our office in 2 weeks and knows to call with questions or concerns.  Physical Exam: General:  Alert, NAD, pleasant, comfortable Abd:  Soft, ND, mild tenderness, incisions C/D/I    Medication List    TAKE these medications       HYDROcodone-acetaminophen 10-325 MG per tablet  Commonly known as:  NORCO  Take 0.5-2 tablets by mouth every 6 (six) hours as needed.     methylphenidate 20 MG tablet  Commonly known as:  RITALIN  Take 20 mg by mouth daily as needed.  pravastatin 40 MG tablet  Commonly known as:  PRAVACHOL  Take 40 mg by mouth daily.             Follow-up Information   Follow up with [facility] On 03/17/2013. (APPT AT 12:30PM ON 03/17/13, PLEASE ARRIVE AT 12:00PM FOR CHECK IN)    Contact information:    [address]   [place] [address] [phone]       Signed: Candiss Norse [facility] [phone]  03/05/2013, 7:31 AM

## 2013-03-05 NOTE — Progress Notes (Deleted)
PT LEFT WITHOUT DC INSTRUCTIONS/RX. INSTRUCTED PT TO WAIT X30 MINS FOR MEGAN DORT PA TO COME AND SIGN PAIN RX.Marland Kitchen PT LEFT WITHOUT BEING SEEN IN HALLWAY.

## 2013-03-05 NOTE — Progress Notes (Signed)
1 Day Post-Op  Subjective: Pt c/o pain, asking for dilaudid every 1-2 hours and refusing medication.  Pt on percocet but says he cant take Oxy IR due to itching.  Pt asking for dilaudid tablets for pain.  Pt ambulating throughout the night.  Tolerated full liquids last night.  Pt having flatus, no BM yet.    Objective: Vital signs in last 24 hours: Temp:  [97.9 F (36.6 C)-98.6 F (37 C)] 97.9 F (36.6 C) (04/24 0538) Pulse Rate:  [67-98] 77 (04/24 0538) Resp:  [18-21] 18 (04/24 0538) BP: (109-144)/(51-83) 114/69 mmHg (04/24 0538) SpO2:  [93 %-100 %] 93 % (04/24 0538) Last BM Date: 03/03/13  Intake/Output from previous day: 04/23 0701 - 04/24 0700 In: 5048.3 [P.O.:1110; I.V.:3138.3; IV Piggyback:800] Out: 1100 [Urine:1050; Blood:50] Intake/Output this shift:    PE: Gen:  Alert, NAD, pleasant Abd: Soft, mild distended, minimally tender to palp, +BS, no HSM, incisions C/D/I  Lab Results:   Recent Labs  03/02/13 1040 03/03/13 0550  WBC 11.6* 5.5  HGB 16.7 14.2  HCT 46.5 41.8  PLT 167 150   BMET  Recent Labs  03/02/13 1040 03/03/13 0550  NA 134* 136  K 4.0 4.2  CL 98 101  CO2 26 28  GLUCOSE 120* 109*  BUN 13 8  CREATININE 0.89 0.91  CALCIUM 9.2 8.6   PT/INR No results found for this basename: LABPROT, INR,  in the last 72 hours CMP     Component Value Date/Time   NA 136 03/03/2013 0550   K 4.2 03/03/2013 0550   CL 101 03/03/2013 0550   CO2 28 03/03/2013 0550   GLUCOSE 109* 03/03/2013 0550   BUN 8 03/03/2013 0550   CREATININE 0.91 03/03/2013 0550   CALCIUM 8.6 03/03/2013 0550   PROT 6.4 03/03/2013 0550   ALBUMIN 3.1* 03/03/2013 0550   AST 634* 03/03/2013 0550   ALT 797* 03/03/2013 0550   ALKPHOS 200* 03/03/2013 0550   BILITOT 4.3* 03/03/2013 0550   GFRNONAA >90 03/03/2013 0550   GFRAA >90 03/03/2013 0550   Lipase     Component Value Date/Time   LIPASE 34 03/02/2013 1040       Studies/Results: Dg Cholangiogram Operative  03/04/2013  *RADIOLOGY REPORT*   Clinical Data:   62 year old male undergoing laparoscopic cholecystectomy.  Fluoroscopy time of 0-minute-7-seconds was utilized.  INTRAOPERATIVE CHOLANGIOGRAM  Technique:  Cholangiographic images from the C-arm fluoroscopic device were submitted for interpretation post-operatively.  Please see the procedural report for the amount of contrast and the fluoroscopy time utilized.  Comparison:  ERCP images 03/03/2013 and earlier.  Findings:  Surgical clips at the level of the cystic duct which is cannulated.  Contrast injection.  No intra or extra panic biliary ductal dilatation.  Prompt emptying of contrast to the duodenum. No filling defect or extravasation.  IMPRESSION: Negative intraoperative cholangiogram.   Original Report Authenticated By: Erskine Speed, M.D.    Dg Ercp Biliary & Pancreatic Ducts  03/03/2013  *RADIOLOGY REPORT*  Clinical Data: Common bile duct stones.  Cholelithiasis.  ERCP  Comparison:  Ultrasound dated 03/02/2013  Technique:  Multiple spot images obtained with the fluoroscopic device and submitted for interpretation post-procedure.  ERCP was performed by Dr. Dulce Sellar.  Findings: Multiple c-arm images from the ERCP demonstrate a guide wire and catheter in the common bile duct.  There are no definitive stones seen on the available images.  IMPRESSION: ERCP performed by Dr. Dulce Sellar.  These images were submitted for radiologic interpretation only. Please see  the procedural report for the amount of contrast and the fluoroscopy time utilized.   Original Report Authenticated By: Francene Boyers, M.D.     Anti-infectives: Anti-infectives   Start     Dose/Rate Route Frequency Ordered Stop   03/02/13 1630  Ampicillin-Sulbactam (UNASYN) 3 g in sodium chloride 0.9 % 100 mL IVPB     3 g 100 mL/hr over 60 Minutes Intravenous Every 6 hours 03/02/13 1622         Assessment/Plan POD #1 s/p lap chole with IOC for cholecystitis 1.  Orals for pain instead of IV 2.  Ambulate and IS 3.  SCD's, add  lovenox if not going home 4.  Hopefully home today if pain can be better controlled with    LOS: 3 days    DORT, Leightyn Cina 03/05/2013, 8:15 AM Pager: [phone]

## 2013-03-05 NOTE — Progress Notes (Signed)
1610 Patient states he got very little relief from the 2 percocets he received at 0009. 0152 Dilaudid 1mg  given IV.0225 Patient called stated he could not stand the pain. Patient states he could not get a deep breathe wanted some O2. O2 started at 2l/m nasal cannula. Dr. Dwain Sarna notified order received. 9604 Dilaudid 1mg  given IV.  0240 Patient up and walking the halls to help relieve the gas.0330 Patient sitting in gas chair. 5409 Patient stated in rock in the gas chair for with no relief. Dilaudid 1mg  given IV. Patient resting in bed at present. Will continue to monitor.

## 2013-03-09 NOTE — Anesthesia Postprocedure Evaluation (Signed)
  Anesthesia Post-op Note  Patient: [name]  Procedure(s) Performed: Procedure(s): LAPAROSCOPIC CHOLECYSTECTOMY WITH INTRAOPERATIVE CHOLANGIOGRAM (N/A)  Patient Location: PACU  Anesthesia Type:General  Level of Consciousness: alert   Airway and Oxygen Therapy: Patient Spontanous Breathing  Post-op Pain: mild  Post-op Assessment: Post-op Vital signs reviewed  Post-op Vital Signs: stable  Complications: No apparent anesthesia complications

## 2013-03-17 ENCOUNTER — Encounter (INDEPENDENT_AMBULATORY_CARE_PROVIDER_SITE_OTHER): Payer: 59

## 2013-05-06 NOTE — Telephone Encounter (Signed)
This was an error

## 2013-05-15 ENCOUNTER — Inpatient Hospital Stay (HOSPITAL_COMMUNITY)
Admission: EM | Admit: 2013-05-15 | Discharge: 2013-05-17 | DRG: 502 | Disposition: A | Discharge status: Home / Self Care | Payer: 59 | Attending: Orthopedic Surgery | Admitting: Orthopedic Surgery

## 2013-05-15 ENCOUNTER — Emergency Department (HOSPITAL_COMMUNITY): Payer: 59

## 2013-05-15 DIAGNOSIS — E785 Hyperlipidemia, unspecified: Secondary | ICD-10-CM | POA: Diagnosis present

## 2013-05-15 DIAGNOSIS — W19XXXA Unspecified fall, initial encounter: Secondary | ICD-10-CM | POA: Diagnosis present

## 2013-05-15 DIAGNOSIS — S46819A Strain of other muscles, fascia and tendons at shoulder and upper arm level, unspecified arm, initial encounter: Secondary | ICD-10-CM | POA: Diagnosis present

## 2013-05-15 DIAGNOSIS — F988 Other specified behavioral and emotional disorders with onset usually occurring in childhood and adolescence: Secondary | ICD-10-CM | POA: Diagnosis present

## 2013-05-15 DIAGNOSIS — S43016A Anterior dislocation of unspecified humerus, initial encounter: Principal | ICD-10-CM | POA: Diagnosis present

## 2013-05-15 DIAGNOSIS — S43004A Unspecified dislocation of right shoulder joint, initial encounter: Secondary | ICD-10-CM

## 2013-05-15 MED ORDER — ONDANSETRON HCL 4 MG/2ML IJ SOLN
4.0000 mg | Freq: Once | INTRAMUSCULAR | Status: AC
  Administered 2013-05-15: 4 mg via INTRAVENOUS
  Filled 2013-05-15: qty 2

## 2013-05-15 MED ORDER — HYDROMORPHONE HCL PF 1 MG/ML IJ SOLN
1.0000 mg | INTRAMUSCULAR | Status: AC
  Administered 2013-05-15 (×3): 1 mg via INTRAVENOUS
  Filled 2013-05-15 (×3): qty 1

## 2013-05-15 MED ORDER — KETAMINE HCL 10 MG/ML IJ SOLN
1.0000 mg/kg | Freq: Once | INTRAMUSCULAR | Status: DC
  Filled 2013-05-15: qty 7.1

## 2013-05-15 MED ORDER — KETAMINE HCL 10 MG/ML IJ SOLN
INTRAMUSCULAR | Status: AC | PRN
  Administered 2013-05-15 (×4): 5 mg via INTRAVENOUS
  Administered 2013-05-15: 25 mg via INTRAVENOUS

## 2013-05-15 MED ORDER — HYDROMORPHONE HCL PF 1 MG/ML IJ SOLN
1.0000 mg | INTRAMUSCULAR | Status: DC
  Administered 2013-05-15 (×2): 1 mg via INTRAVENOUS
  Filled 2013-05-15 (×2): qty 1

## 2013-05-15 MED ORDER — PROPOFOL 10 MG/ML IV BOLUS
INTRAVENOUS | Status: AC | PRN
  Administered 2013-05-15: 25 mg via INTRAVENOUS

## 2013-05-15 MED ORDER — PROPOFOL 10 MG/ML IV BOLUS
0.5000 mg/kg | Freq: Once | INTRAVENOUS | Status: DC
  Filled 2013-05-15: qty 20
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
## 2013-05-15 NOTE — ED Notes (Signed)
Dr dean at the bedside
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
## 2013-05-15 NOTE — Consult Note (Signed)
Reason for Consult:right shoulder pain Referring Physician: [name] is an 62 y.o. male.  [id number] He is a 62 year old patient with right shoulder pain. The patient was diving in a [employer] when he fell and landed on his right shoulder. The patient reports having rotator cuff surgery 10 years ago for a degenerative rotator cuff tear on the right-hand side he reports significant right shoulder pain but denies any numbness and tingling in the right arm and denies any other orthopedic complaints. He was seen in the emergency room where Dr. Freida Busman attempted conscious sedation and closed reduction of the right shoulder; however, despite being able to reduce the shoulder it would not remain reduced and the reduction itself was) E.". Patient successfully had an MRI scan which showed tearing of the subscapularis and interposition of the biceps tendon behind the humeral head preventing concentric reduction of the right humerus. Patient has never had shoulder instability before.   Past Medical History  Diagnosis Date  . ADD (attention deficit disorder)   . HLD (hyperlipidemia)   . Hypogonadism male   . Nephrolithiasis   . Complication of anesthesia     VALIUM MAKES ME CRAZY"    Past Surgical History  Procedure Laterality Date  . Vasectomy    . Joint replacement    . Distal biceps tendon repair Left   . Rotator cuff repair Right   . Hernia repair      with mesh  . Ercp N/A 03/03/2013    Procedure: ENDOSCOPIC RETROGRADE CHOLANGIOPANCREATOGRAPHY (ERCP);  Surgeon: Willis Modena, MD;  Location: [facility] ENDOSCOPY;  Service: Endoscopy;  Laterality: N/A;  prone positioning  . Cholecystectomy N/A 03/04/2013    Procedure: LAPAROSCOPIC CHOLECYSTECTOMY WITH INTRAOPERATIVE CHOLANGIOGRAM;  Surgeon: Cherylynn Ridges, MD;  Location: [place] OR;  Service: General;  Laterality: N/A;    No family history on file.  Social History:  reports that he has never smoked. He has never used smokeless  tobacco. He reports that he does not drink alcohol or use illicit drugs.  Allergies:  Allergies  Allergen Reactions  . Codeine Itching  . Valium (Diazepam) Other (See Comments)    Pt states "it jacks him up"    Medications: I have reviewed the patient's current medications.  No results found for this or any previous visit (from the past 48 hour(s)).  Dg Shoulder Right  05/15/2013   *RADIOLOGY REPORT*  Clinical Data: Shoulder injury  RIGHT SHOULDER - 2+ VIEW  Comparison: None  Findings: Anterior shoulder dislocation is identified.  No dislocations identified.  No radiopaque foreign bodies or soft tissue calcification.  Suture anchors identified within the humeral head.  IMPRESSION:  1.  Anterior shoulder dislocation.   Original Report Authenticated By: Signa Kell, M.D.   Dg Shoulder Right Port  05/15/2013   *RADIOLOGY REPORT*  Clinical Data:  Shoulder dislocation post attempted reduction  PORTABLE RIGHT SHOULDER - 2+ VIEW  Comparison: Portable exam 2001 hours compared to earlier study of 1840 hours  Findings: Persistent anterior sub coracoid glenohumeral dislocation. No fractures identified.  IMPRESSION: Persistent anterior right glenohumeral dislocation.   Original Report Authenticated By: Ulyses Southward, M.D.    Review of Systems  Constitutional: Negative.   HENT: Negative.   Eyes: Negative.   Respiratory: Negative.   Cardiovascular: Negative.   Gastrointestinal: Negative.   Genitourinary: Negative.   Musculoskeletal: Positive for joint pain.  Skin: Negative.   Neurological: Negative.   Psychiatric/Behavioral: Negative.    Blood pressure 143/83, pulse 86, temperature  98.4 F (36.9 C), temperature source Oral, resp. rate 19, height 5' 8.9" (1.75 m), weight 85.3 kg (188 lb 0.8 oz), SpO2 96.00%. Physical Exam  Constitutional: He appears well-developed.  HENT:  Head: Normocephalic.  Eyes: Pupils are equal, round, and reactive to light.  Neck: Normal range of motion.   Cardiovascular: Normal rate.   Respiratory: Effort normal.  GI: Soft.  Neurological: He is alert.  Skin: Skin is warm.  Psychiatric: He has a normal mood and affect.  examination of the right upper extremity demonstrates swelling in the shoulder joint region. Radial pulses intact. Grip EPL FPL interosseous wrist flexion wrist extension biceps triceps strength 5+ out of 5 upward strength 5 minus out of 5 at the deltoid muscle does fire. No numbness tingling or paresthesias in the right arm at this time. Patient has well healed total knee replacement incisions on the right-hand side the left-hand side.  Assessment/Plan: Impression is a reducible right shoulder dislocation without evidence of neurovascular compromise in the right upper extremity. MRI scanning does confirm interposition of the subscapularis which is torn off of the lesser tuberosity as well as interposition of the biceps tendon which is translocated behind the humeral head preventing concentric reduction. Plan at this time is for open reduction of the dislocation with biceps tenodesis and subscapularis repair. This type of an altercation best be done approximately 8 hours from now with the proper equipment and personnel.  Risk and benefits of surgical intervention discussed with the patient including but not limited to shoulder stiffness persistent shoulder instability nerve vessel damage. Extensive nature of the rehabilitative process also discussed with the patient. He states that he is retired does do physical activity as part of continued employment. Patient understands risk and benefits all questions answered  [name],[name] 05/15/2013, 11:20 PM

## 2013-05-15 NOTE — ED Notes (Signed)
Pt to MRI

## 2013-05-16 ENCOUNTER — Inpatient Hospital Stay (HOSPITAL_COMMUNITY): Payer: 59 | Admitting: Anesthesiology

## 2013-05-16 ENCOUNTER — Encounter (HOSPITAL_COMMUNITY): Payer: Self-pay | Admitting: Anesthesiology

## 2013-05-16 ENCOUNTER — Encounter (HOSPITAL_COMMUNITY)
Admission: EM | Disposition: A | Discharge status: Home / Self Care | Payer: Self-pay | Source: Home / Self Care | Attending: Orthopedic Surgery

## 2013-05-16 ENCOUNTER — Inpatient Hospital Stay (HOSPITAL_COMMUNITY): Payer: 59

## 2013-05-16 ENCOUNTER — Encounter (HOSPITAL_COMMUNITY): Payer: Self-pay | Admitting: Orthopedic Surgery

## 2013-05-16 HISTORY — PX: ORIF HUMERUS FRACTURE: SHX2126

## 2013-05-16 LAB — BASIC METABOLIC PANEL
BUN: 12 mg/dL (ref 6–23)
CO2: 25 mEq/L (ref 19–32)
Calcium: 8.5 mg/dL (ref 8.4–10.5)
Chloride: 104 mEq/L (ref 96–112)
Creatinine, Ser: 0.87 mg/dL (ref 0.50–1.35)
Glucose, Bld: 141 mg/dL — ABNORMAL HIGH (ref 70–99)

## 2013-05-16 LAB — CBC WITH DIFFERENTIAL/PLATELET
Eosinophils Relative: 1 % (ref 0–5)
HCT: 43.5 % (ref 39.0–52.0)
Hemoglobin: 14.4 g/dL (ref 13.0–17.0)
Lymphocytes Relative: 15 % (ref 12–46)
MCHC: 33.1 g/dL (ref 30.0–36.0)
MCV: 90.4 fL (ref 78.0–100.0)
Monocytes Absolute: 1.6 10*3/uL — ABNORMAL HIGH (ref 0.1–1.0)
Monocytes Relative: 14 % — ABNORMAL HIGH (ref 3–12)
Neutro Abs: 8.1 10*3/uL — ABNORMAL HIGH (ref 1.7–7.7)
RDW: 14.4 % (ref 11.5–15.5)
WBC: 11.6 10*3/uL — ABNORMAL HIGH (ref 4.0–10.5)

## 2013-05-16 SURGERY — OPEN REDUCTION INTERNAL FIXATION (ORIF) PROXIMAL HUMERUS FRACTURE
Anesthesia: General | Site: Shoulder | Laterality: Right | Wound class: Clean

## 2013-05-16 MED ORDER — ONDANSETRON HCL 4 MG PO TABS: 4.0000 mg | ORAL_TABLET | Freq: Four times a day (QID) | ORAL | Status: DC | PRN

## 2013-05-16 MED ORDER — HYDROMORPHONE HCL PF 1 MG/ML IJ SOLN
0.5000 mg | INTRAMUSCULAR | Status: DC | PRN
  Administered 2013-05-16: 02:00:00 via INTRAVENOUS
  Filled 2013-05-16: qty 1

## 2013-05-16 MED ORDER — HYDROMORPHONE HCL PF 1 MG/ML IJ SOLN: 0.2500 mg | INTRAMUSCULAR | Status: DC | PRN

## 2013-05-16 MED ORDER — LABETALOL HCL 5 MG/ML IV SOLN
10.0000 mg | Freq: Once | INTRAVENOUS | Status: AC
  Filled 2013-05-16: qty 4

## 2013-05-16 MED ORDER — ACETAMINOPHEN 650 MG RE SUPP: 650.0000 mg | Freq: Four times a day (QID) | RECTAL | Status: DC | PRN

## 2013-05-16 MED ORDER — LIDOCAINE HCL (CARDIAC) 20 MG/ML IV SOLN
INTRAVENOUS | Status: DC | PRN
  Administered 2013-05-16: 100 mg via INTRAVENOUS

## 2013-05-16 MED ORDER — 0.9 % SODIUM CHLORIDE (POUR BTL) OPTIME
TOPICAL | Status: DC | PRN
  Administered 2013-05-16: 3000 mL

## 2013-05-16 MED ORDER — HYDROMORPHONE HCL PF 1 MG/ML IJ SOLN: 0.5000 mg | INTRAMUSCULAR | Status: DC | PRN

## 2013-05-16 MED ORDER — METHOCARBAMOL 500 MG PO TABS
500.0000 mg | ORAL_TABLET | Freq: Four times a day (QID) | ORAL | Status: DC | PRN
  Administered 2013-05-17 (×2): 500 mg via ORAL
  Filled 2013-05-16 (×2): qty 1

## 2013-05-16 MED ORDER — NEOSTIGMINE METHYLSULFATE 1 MG/ML IJ SOLN
INTRAMUSCULAR | Status: DC | PRN
  Administered 2013-05-16: 4 mg via INTRAVENOUS

## 2013-05-16 MED ORDER — FENTANYL CITRATE 0.05 MG/ML IJ SOLN
INTRAMUSCULAR | Status: DC | PRN
  Administered 2013-05-16: 100 ug via INTRAVENOUS
  Administered 2013-05-16 (×2): 50 ug via INTRAVENOUS

## 2013-05-16 MED ORDER — GLYCOPYRROLATE 0.2 MG/ML IJ SOLN
INTRAMUSCULAR | Status: DC | PRN
  Administered 2013-05-16: 0.6 mg via INTRAVENOUS

## 2013-05-16 MED ORDER — LABETALOL HCL 5 MG/ML IV SOLN
10.0000 mg | Freq: Once | INTRAVENOUS | Status: AC
  Administered 2013-05-16: 10 mg via INTRAVENOUS
  Filled 2013-05-16: qty 4

## 2013-05-16 MED ORDER — METOCLOPRAMIDE HCL 5 MG/ML IJ SOLN
5.0000 mg | Freq: Three times a day (TID) | INTRAMUSCULAR | Status: DC | PRN
Start: 2013-05-16 — End: 2013-05-17

## 2013-05-16 MED ORDER — METHOCARBAMOL 100 MG/ML IJ SOLN
500.0000 mg | Freq: Four times a day (QID) | INTRAVENOUS | Status: DC | PRN
  Filled 2013-05-16: qty 5

## 2013-05-16 MED ORDER — NALOXONE HCL 0.4 MG/ML IJ SOLN: 0.4000 mg | INTRAMUSCULAR | Status: DC | PRN

## 2013-05-16 MED ORDER — HYDROMORPHONE 0.3 MG/ML IV SOLN
INTRAVENOUS | Status: DC
  Administered 2013-05-16: 4.8 mg via INTRAVENOUS
  Administered 2013-05-16: 2.4 mg via INTRAVENOUS

## 2013-05-16 MED ORDER — DIPHENHYDRAMINE HCL 50 MG/ML IJ SOLN: 12.5000 mg | Freq: Four times a day (QID) | INTRAMUSCULAR | Status: DC | PRN

## 2013-05-16 MED ORDER — CEFAZOLIN SODIUM-DEXTROSE 2-3 GM-% IV SOLR: INTRAVENOUS | Status: DC | PRN

## 2013-05-16 MED ORDER — POTASSIUM CHLORIDE IN NACL 20-0.9 MEQ/L-% IV SOLN
INTRAVENOUS | Status: DC
  Filled 2013-05-16 (×2): qty 1000

## 2013-05-16 MED ORDER — ONDANSETRON HCL 4 MG/2ML IJ SOLN: 4.0000 mg | Freq: Four times a day (QID) | INTRAMUSCULAR | Status: DC | PRN

## 2013-05-16 MED ORDER — CEFAZOLIN SODIUM-DEXTROSE 2-3 GM-% IV SOLR
2.0000 g | INTRAVENOUS | Status: AC
  Administered 2013-05-16: 2 g via INTRAVENOUS
  Filled 2013-05-16: qty 50

## 2013-05-16 MED ORDER — ASPIRIN 325 MG PO TABS
325.0000 mg | ORAL_TABLET | Freq: Every day | ORAL | Status: DC
  Administered 2013-05-17 (×2): 325 mg via ORAL
  Filled 2013-05-16 (×3): qty 1

## 2013-05-16 MED ORDER — CHLORHEXIDINE GLUCONATE 4 % EX LIQD
60.0000 mL | Freq: Once | CUTANEOUS | Status: DC
  Filled 2013-05-16: qty 60

## 2013-05-16 MED ORDER — DIPHENHYDRAMINE HCL 12.5 MG/5ML PO ELIX: 12.5000 mg | ORAL_SOLUTION | Freq: Four times a day (QID) | ORAL | Status: DC | PRN

## 2013-05-16 MED ORDER — HYDROMORPHONE HCL PF 1 MG/ML IJ SOLN
1.0000 mg | INTRAMUSCULAR | Status: DC | PRN
  Administered 2013-05-16: 1 mg via INTRAVENOUS
  Filled 2013-05-16: qty 1

## 2013-05-16 MED ORDER — CEFAZOLIN SODIUM-DEXTROSE 2-3 GM-% IV SOLR
2.0000 g | Freq: Three times a day (TID) | INTRAVENOUS | Status: AC
  Administered 2013-05-16 – 2013-05-17 (×2): 2 g via INTRAVENOUS
  Filled 2013-05-16 (×2): qty 50

## 2013-05-16 MED ORDER — ONDANSETRON HCL 4 MG/2ML IJ SOLN
INTRAMUSCULAR | Status: DC | PRN
  Administered 2013-05-16: 4 mg via INTRAVENOUS

## 2013-05-16 MED ORDER — METOCLOPRAMIDE HCL 10 MG PO TABS: 5.0000 mg | ORAL_TABLET | Freq: Three times a day (TID) | ORAL | Status: DC | PRN

## 2013-05-16 MED ORDER — LACTATED RINGERS IV SOLN: INTRAVENOUS | Status: DC | PRN

## 2013-05-16 MED ORDER — MIDAZOLAM HCL 5 MG/5ML IJ SOLN
INTRAMUSCULAR | Status: DC | PRN
  Administered 2013-05-16: 2 mg via INTRAVENOUS

## 2013-05-16 MED ORDER — CEFAZOLIN SODIUM-DEXTROSE 2-3 GM-% IV SOLR
2.0000 g | Freq: Once | INTRAVENOUS | Status: AC
  Administered 2013-05-16: 2 g via INTRAVENOUS
  Filled 2013-05-16: qty 50

## 2013-05-16 MED ORDER — OXYCODONE HCL 5 MG/5ML PO SOLN: 5.0000 mg | Freq: Once | ORAL | Status: AC | PRN

## 2013-05-16 MED ORDER — PROPOFOL 10 MG/ML IV BOLUS
INTRAVENOUS | Status: DC | PRN
  Administered 2013-05-16: 200 mg via INTRAVENOUS

## 2013-05-16 MED ORDER — OXYCODONE HCL 5 MG PO TABS: 10.0000 mg | ORAL_TABLET | ORAL | Status: DC | PRN

## 2013-05-16 MED ORDER — HYDROMORPHONE 0.3 MG/ML IV SOLN
INTRAVENOUS | Status: AC
  Administered 2013-05-16: 16:00:00
  Filled 2013-05-16: qty 25

## 2013-05-16 MED ORDER — MENTHOL 3 MG MT LOZG: 1.0000 | LOZENGE | OROMUCOSAL | Status: DC | PRN

## 2013-05-16 MED ORDER — HYDROMORPHONE HCL PF 1 MG/ML IJ SOLN
INTRAMUSCULAR | Status: AC
  Filled 2013-05-16: qty 2

## 2013-05-16 MED ORDER — ARTIFICIAL TEARS OP OINT
TOPICAL_OINTMENT | OPHTHALMIC | Status: DC | PRN
  Administered 2013-05-16: 1 via OPHTHALMIC

## 2013-05-16 MED ORDER — BUPIVACAINE-EPINEPHRINE PF 0.5-1:200000 % IJ SOLN
INTRAMUSCULAR | Status: DC | PRN
  Administered 2013-05-16: 30 mL

## 2013-05-16 MED ORDER — LACTATED RINGERS IV SOLN
INTRAVENOUS | Status: DC | PRN
  Administered 2013-05-16 (×3): via INTRAVENOUS

## 2013-05-16 MED ORDER — SODIUM CHLORIDE 0.9 % IJ SOLN: 9.0000 mL | INTRAMUSCULAR | Status: DC | PRN

## 2013-05-16 MED ORDER — HYDROMORPHONE HCL PF 1 MG/ML IJ SOLN
0.5000 mg | INTRAMUSCULAR | Status: DC | PRN
  Administered 2013-05-17: 0.5 mg via INTRAVENOUS
  Filled 2013-05-16: qty 1

## 2013-05-16 MED ORDER — PHENYLEPHRINE HCL 10 MG/ML IJ SOLN
INTRAMUSCULAR | Status: DC | PRN
  Administered 2013-05-16 (×4): 40 ug via INTRAVENOUS

## 2013-05-16 MED ORDER — ROCURONIUM BROMIDE 100 MG/10ML IV SOLN
INTRAVENOUS | Status: DC | PRN
  Administered 2013-05-16 (×4): 10 mg via INTRAVENOUS
  Administered 2013-05-16: 50 mg via INTRAVENOUS

## 2013-05-16 MED ORDER — OXYCODONE HCL 5 MG PO TABS: 5.0000 mg | ORAL_TABLET | Freq: Once | ORAL | Status: AC | PRN

## 2013-05-16 MED ORDER — HYDROMORPHONE HCL 2 MG PO TABS
2.0000 mg | ORAL_TABLET | ORAL | Status: DC | PRN
  Administered 2013-05-17 (×3): 2 mg via ORAL
  Filled 2013-05-16 (×3): qty 1

## 2013-05-16 MED ORDER — PHENOL 1.4 % MT LIQD: 1.0000 | OROMUCOSAL | Status: DC | PRN

## 2013-05-16 MED ORDER — DEXAMETHASONE SODIUM PHOSPHATE 4 MG/ML IJ SOLN
INTRAMUSCULAR | Status: DC | PRN
  Administered 2013-05-16: 4 mg via INTRAVENOUS

## 2013-05-16 MED ORDER — LABETALOL HCL 5 MG/ML IV SOLN
INTRAVENOUS | Status: AC
  Administered 2013-05-16: 10 mg via INTRAVENOUS
  Filled 2013-05-16: qty 4

## 2013-05-16 MED ORDER — ACETAMINOPHEN 325 MG PO TABS
650.0000 mg | ORAL_TABLET | Freq: Four times a day (QID) | ORAL | Status: DC | PRN
  Administered 2013-05-17: 650 mg via ORAL
  Filled 2013-05-16: qty 2

## 2013-05-16 MED ORDER — SIMVASTATIN 5 MG PO TABS
5.0000 mg | ORAL_TABLET | Freq: Every day | ORAL | Status: DC
  Administered 2013-05-16: 5 mg via ORAL
  Filled 2013-05-16 (×2): qty 1

## 2013-05-16 MED ORDER — HYDROMORPHONE 0.3 MG/ML IV SOLN
INTRAVENOUS | Status: AC
  Filled 2013-05-16: qty 25

## 2013-05-16 MED ORDER — MORPHINE SULFATE 4 MG/ML IJ SOLN
4.0000 mg | INTRAMUSCULAR | Status: DC | PRN
  Administered 2013-05-16: 4 mg via INTRAVENOUS
  Filled 2013-05-16: qty 1

## 2013-05-16 SURGICAL SUPPLY — 65 items
ANCH SUT 2 CRKSRW FT 14X4.5 (Anchor) ×2 IMPLANT
ANCH SUT 2 FT CRKSCW 14.7 STRL (Anchor) ×4 IMPLANT
ANCH SUT PUSHLCK 24X4.5 STRL (Orthopedic Implant) ×3 IMPLANT
ANCHOR CORKSCREW BIO 5.5 FT (Anchor) ×4 IMPLANT
ANCHOR PEEK CORKSCREW 4.5 (Anchor) ×2 IMPLANT
APL SKNCLS STERI-STRIP NONHPOA (GAUZE/BANDAGES/DRESSINGS) ×1
BANDAGE ELASTIC 4 VELCRO ST LF (GAUZE/BANDAGES/DRESSINGS) IMPLANT
BANDAGE ELASTIC 6 VELCRO ST LF (GAUZE/BANDAGES/DRESSINGS) IMPLANT
BENZOIN TINCTURE PRP APPL 2/3 (GAUZE/BANDAGES/DRESSINGS) ×2 IMPLANT
BNDG COHESIVE 4X5 TAN STRL (GAUZE/BANDAGES/DRESSINGS) IMPLANT
CLOTH BEACON ORANGE TIMEOUT ST (SAFETY) ×2 IMPLANT
COVER SURGICAL LIGHT HANDLE (MISCELLANEOUS) ×2 IMPLANT
DRAIN PENROSE 1/2X12 LTX STRL (WOUND CARE) IMPLANT
DRAPE C-ARM 42X72 X-RAY (DRAPES) IMPLANT
DRAPE INCISE IOBAN 66X45 STRL (DRAPES) ×1 IMPLANT
DRAPE U-SHAPE 47X51 STRL (DRAPES) ×3 IMPLANT
DRSG MEPILEX BORDER 4X4 (GAUZE/BANDAGES/DRESSINGS) ×1 IMPLANT
DRSG MEPILEX BORDER 4X8 (GAUZE/BANDAGES/DRESSINGS) ×2 IMPLANT
DRSG PAD ABDOMINAL 8X10 ST (GAUZE/BANDAGES/DRESSINGS) IMPLANT
DURAPREP 26ML APPLICATOR (WOUND CARE) ×2 IMPLANT
ELECT REM PT RETURN 9FT ADLT (ELECTROSURGICAL) ×2
ELECTRODE REM PT RTRN 9FT ADLT (ELECTROSURGICAL) ×1 IMPLANT
FACESHIELD LNG OPTICON STERILE (SAFETY) ×1 IMPLANT
GAUZE XEROFORM 5X9 LF (GAUZE/BANDAGES/DRESSINGS) ×1 IMPLANT
GLOVE BIOGEL PI IND STRL 6.5 (GLOVE) IMPLANT
GLOVE BIOGEL PI IND STRL 8 (GLOVE) ×1 IMPLANT
GLOVE BIOGEL PI INDICATOR 6.5 (GLOVE) ×1
GLOVE BIOGEL PI INDICATOR 8 (GLOVE) ×1
GLOVE ORTHOPEDIC STR SZ6.5 (GLOVE) ×2 IMPLANT
GLOVE SURG ORTHO 8.0 STRL STRW (GLOVE) ×2 IMPLANT
GOWN PREVENTION PLUS LG XLONG (DISPOSABLE) IMPLANT
GOWN PREVENTION PLUS XLARGE (GOWN DISPOSABLE) ×4 IMPLANT
GOWN STRL NON-REIN LRG LVL3 (GOWN DISPOSABLE) ×3 IMPLANT
KIT BASIN OR (CUSTOM PROCEDURE TRAY) ×2 IMPLANT
KIT ROOM TURNOVER OR (KITS) ×2 IMPLANT
MANIFOLD NEPTUNE II (INSTRUMENTS) ×1 IMPLANT
NDL SUT 6 .5 CRC .975X.05 MAYO (NEEDLE) IMPLANT
NEEDLE 21X1 OR PACK (NEEDLE) IMPLANT
NEEDLE MAYO TAPER (NEEDLE) ×2
NS IRRIG 1000ML POUR BTL (IV SOLUTION) ×2 IMPLANT
PACK SHOULDER (CUSTOM PROCEDURE TRAY) ×2 IMPLANT
PAD ARMBOARD 7.5X6 YLW CONV (MISCELLANEOUS) ×4 IMPLANT
PAD CAST 4YDX4 CTTN HI CHSV (CAST SUPPLIES) IMPLANT
PADDING CAST COTTON 4X4 STRL (CAST SUPPLIES)
PENCIL BUTTON HOLSTER BLD 10FT (ELECTRODE) IMPLANT
PUSHLOCK PEEK 4.5X24 (Orthopedic Implant) ×3 IMPLANT
SLING ARM IMMOBILIZER LRG (SOFTGOODS) ×2 IMPLANT
SPONGE GAUZE 4X4 12PLY (GAUZE/BANDAGES/DRESSINGS) IMPLANT
SPONGE LAP 18X18 X RAY DECT (DISPOSABLE) ×2 IMPLANT
SPONGE LAP 4X18 X RAY DECT (DISPOSABLE) ×3 IMPLANT
STAPLER VISISTAT 35W (STAPLE) ×2 IMPLANT
STOCKINETTE IMPERVIOUS 9X36 MD (GAUZE/BANDAGES/DRESSINGS) IMPLANT
SUCTION FRAZIER TIP 10 FR DISP (SUCTIONS) IMPLANT
SUT SILK 3 0 (SUTURE) ×2
SUT SILK 3-0 18XBRD TIE 12 (SUTURE) IMPLANT
SUT VIC AB 0 CT2 27 (SUTURE) ×6 IMPLANT
SUT VIC AB 1 CT1 27 (SUTURE) ×18
SUT VIC AB 1 CT1 27XBRD ANBCTR (SUTURE) IMPLANT
SUT VIC AB 1 CT1 27XBRD ANTBC (SUTURE) IMPLANT
SUT VIC AB 2-0 CTB1 (SUTURE) ×1 IMPLANT
TOWEL OR 17X24 6PK STRL BLUE (TOWEL DISPOSABLE) ×2 IMPLANT
TOWEL OR 17X26 10 PK STRL BLUE (TOWEL DISPOSABLE) ×2 IMPLANT
TUBE CONNECTING 12X1/4 (SUCTIONS) IMPLANT
WATER STERILE IRR 1000ML POUR (IV SOLUTION) IMPLANT
YANKAUER SUCT BULB TIP NO VENT (SUCTIONS) IMPLANT

## 2013-05-16 NOTE — Anesthesia Procedure Notes (Signed)
Anesthesia Regional Block:  Interscalene brachial plexus block  Pre-Anesthetic Checklist: ,, timeout performed, Correct Patient, Correct Site, Correct Laterality, Correct Procedure, Correct Position, site marked, Risks and benefits discussed,  Surgical consent,  Pre-op evaluation,  At surgeon's request and post-op pain management  Laterality: Right  Prep: chloraprep       Needles:  Injection technique: Single-shot  Needle Type: Echogenic Stimulator Needle     Needle Length: 5cm 5 cm Needle Gauge: 22 and 22 G    Additional Needles:  Procedures: ultrasound guided (picture in chart) and nerve stimulator Interscalene brachial plexus block  Nerve Stimulator or Paresthesia:  Response: biceps flexion, 0.45 mA,   Additional Responses:   Narrative:  Start time: 05/16/2013 9:56 AM End time: 05/16/2013 10:04 AM Injection made incrementally with aspirations every 5 mL.  Performed by: Personally  Anesthesiologist: Dr Chaney Malling  Additional Notes: Functioning IV was confirmed and monitors were applied.  A 50mm 22ga Arrow echogenic stimulator needle was used. Sterile prep and drape,hand hygiene and sterile gloves were used.  Negative aspiration and negative test dose prior to incremental administration of local anesthetic. The patient tolerated the procedure well.  Ultrasound guidance: relevent anatomy identified, needle position confirmed, local anesthetic spread visualized around nerve(s), vascular puncture avoided.  Image printed for medical record.   Interscalene brachial plexus block

## 2013-05-16 NOTE — Anesthesia Postprocedure Evaluation (Signed)
Anesthesia Post Note  Patient: [name]  Procedure(s) Performed: Procedure(s) (LRB): OPEN REDUCTION OF RIGHT HUMERAL HEAD, SUBSCAPULARUS FIXATION AND BIOTENDONESIS REPAIR (Right)  Anesthesia type: General  Patient location: PACU  Post pain: Pain level controlled and Adequate analgesia  Post assessment: Post-op Vital signs reviewed, Patient's Cardiovascular Status Stable, Respiratory Function Stable, Patent Airway and Pain level controlled  Last Vitals:  Filed Vitals:   05/16/13 0926  BP: 147/85  Pulse: 84  Temp: 36.7 C  Resp: 15    Post vital signs: Reviewed and stable  Level of consciousness: awake, alert  and oriented  Complications: No apparent anesthesia complications

## 2013-05-16 NOTE — ED Notes (Signed)
Pt insisting on dilaudid the er doctor order dilaudid 0.5mg  .  Pt unhappy because his pain was no better.  Dr s dean notified  He ordered dilaudid 1 mg iv every 2 hours.  Pt still unhappy and responded that he had been given 6 mg of dilaudid earlier.  i read him the time and dose of earlier  Dilaudid  Injections.  Pt still unhappy

## 2013-05-16 NOTE — Transfer of Care (Signed)
Immediate Anesthesia Transfer of Care Note  Patient: [name]  Procedure(s) Performed: Procedure(s): OPEN REDUCTION OF RIGHT HUMERAL HEAD, SUBSCAPULARUS FIXATION AND BIOTENDONESIS REPAIR (Right)  Patient Location: PACU  Anesthesia Type:General  Level of Consciousness: awake and patient cooperative  Airway & Oxygen Therapy: Patient Spontanous Breathing and Patient connected to nasal cannula oxygen  Post-op Assessment: Report given to PACU RN, Post -op Vital signs reviewed and stable and Patient moving all extremities X 4  Post vital signs: Reviewed and stable  Complications: No apparent anesthesia complications

## 2013-05-16 NOTE — ED Notes (Signed)
C/o rt shoulder pain morphine given/  Pt wants diklaudid

## 2013-05-16 NOTE — ED Notes (Signed)
Still waiting for a bed assignment.  Pt sleeping when asking for pain meds

## 2013-05-16 NOTE — Progress Notes (Signed)
Pt has avulsion of all rc muscle/tendons except teres minor Plan or today to reduce dislocation and repair rotator cuff

## 2013-05-16 NOTE — Progress Notes (Signed)
Dr. August Saucer notified concerning pt's pain. Pt very unhappy with amount of dilaudid stating "pain meds don't work on me like they do everyone else." Pt stated he cannot take oxycodone or any other pain pills besides dilaudid. Order received for full dose PCA dilaudid, discontinued all other pain meds. PCA set up for pt at 0424 and pt is currently asleep. Will continue to monitor.

## 2013-05-16 NOTE — Preoperative (Signed)
Beta Blockers   Reason not to administer Beta Blockers:Not Applicable 

## 2013-05-16 NOTE — ED Provider Notes (Signed)
Pt is demanding dilaudid instead of morphine - one dose ordered - has normal MS and level of alertness at this time.  Vida Roller, MD 05/16/13 [phone]

## 2013-05-16 NOTE — ED Notes (Signed)
The pt reports that the morphine is not helping and he wants an order for dilaudid.  Dr dean paged

## 2013-05-16 NOTE — ED Notes (Signed)
Waiting for bed assignment.  Pt alseep

## 2013-05-16 NOTE — Anesthesia Preprocedure Evaluation (Signed)
Anesthesia Evaluation  Patient identified by MRN, date of birth, ID band Patient awake    Reviewed: Allergy & Precautions, H&P , NPO status , Patient's Chart, lab work & pertinent test results  Airway Mallampati: II  Neck ROM: full    Dental   Pulmonary          Cardiovascular  hypercholesterolemia   Neuro/Psych ADD    GI/Hepatic   Endo/Other    Renal/GU      Musculoskeletal   Abdominal   Peds  Hematology   Anesthesia Other Findings   Reproductive/Obstetrics                           Anesthesia Physical Anesthesia Plan  ASA: II  Anesthesia Plan: General and Regional   Post-op Pain Management: MAC Combined w/ Regional for Post-op pain   Induction: Intravenous  Airway Management Planned: Oral ETT  Additional Equipment:   Intra-op Plan:   Post-operative Plan: Extubation in OR  Informed Consent: I have reviewed the patients History and Physical, chart, labs and discussed the procedure including the risks, benefits and alternatives for the proposed anesthesia with the patient or authorized representative who has indicated his/her understanding and acceptance.     Plan Discussed with: CRNA, Anesthesiologist and Surgeon  Anesthesia Plan Comments:         Anesthesia Quick Evaluation

## 2013-05-16 NOTE — Brief Op Note (Signed)
05/15/2013 - 05/16/2013  3:20 PM  PATIENT:  [name]  62 y.o. male  PRE-OPERATIVE DIAGNOSIS:  anterior sub-coracoid glenohumeral dislocation;avulsion all RC muscle/tendon except teres minor  POST-OPERATIVE DIAGNOSIS:  same  PROCEDURE:  Procedure(s): OPEN REDUCTION OF RIGHT HUMERAL HEAD, SUBSCAPULARUS FIXATION ANDbiceps TENDONESIS,, repair of infraspinatus and supraspinatus  SURGEON:  Surgeon(s): Cammy Copa, MD  ASSISTANT: Carlena Sax roberts  ANESTHESIA:   general  EBL: 50 ml    Total I/O In: 2000 [I.V.:2000] Out: 850 [Urine:650; Blood:200]  BLOOD ADMINISTERED: none  DRAINS: none   LOCAL MEDICATIONS USED:  none  SPECIMEN:  No Specimen  COUNTS:  YES  TOURNIQUET:  * No tourniquets in log *  DICTATION: .Other Dictation: Dictation Number [id number]  PLAN OF CARE: Admit to inpatient   PATIENT DISPOSITION:  PACU - hemodynamically stable

## 2013-05-17 MED ORDER — HYDROMORPHONE HCL 2 MG PO TABS
4.0000 mg | ORAL_TABLET | ORAL | Status: DC | PRN
  Administered 2013-05-17: 2 mg via ORAL
  Administered 2013-05-17: 4 mg via ORAL
  Filled 2013-05-17: qty 2
  Filled 2013-05-17: qty 1

## 2013-05-17 MED ORDER — METHOCARBAMOL 500 MG PO TABS: 500.0000 mg | ORAL_TABLET | Freq: Four times a day (QID) | ORAL | Status: DC | PRN

## 2013-05-17 MED ORDER — HYDROMORPHONE HCL 4 MG PO TABS: 4.0000 mg | ORAL_TABLET | ORAL | Status: DC | PRN

## 2013-05-17 NOTE — Progress Notes (Signed)
Pt stable vss Dressing dry Pain controlled mod well Possible dc today

## 2013-05-17 NOTE — Progress Notes (Signed)
Pt very frustrated with PCA and constant beeping/alarms. Requested it be discontinued and po meds started. Dr. August Saucer notified. Orders received for po dilaudid. Pt relieved with change in medication. Resting well and much more pleasant.

## 2013-05-17 NOTE — Evaluation (Signed)
Physical Therapy Evaluation Patient Details Name: [name] MRN: [id number] DOB: Feb 18, 1951 Today's Date: 05/17/2013 Time: 7829-5621 PT Time Calculation (min): 12 min  PT Assessment / Plan / Recommendation History of Present Illness  Pt is a 62 y.o. OPEN REDUCTION OF RIGHT HUMERAL HEAD, SUBSCAPULARUS FIXATION AND BIOTENDONESIS REPAIR (Right)  Clinical Impression  Pt is moving well today. Has min difficulties with dynamic balance activities but no LOB noted. Pt educated on importance of using SPC at home if feeling unsteady and decreasing gt speed when feeling unsteady. Pt verbalized understanding. Is safe from mobility standpoint to D/C home with wife. Will sign off at this time.     PT Assessment  Patent does not need any further PT services    Follow Up Recommendations  No PT follow up;Supervision for mobility/OOB;Supervision - Intermittent    Does the patient have the potential to tolerate intense rehabilitation      Barriers to Discharge        Equipment Recommendations  None recommended by PT    Recommendations for Other Services     Frequency      Precautions / Restrictions Precautions Precautions: Shoulder Type of Shoulder Precautions: ADL education, Sling education Shoulder Interventions: Shoulder sling/immobilizer (off for dressing and bathing ) Precaution Booklet Issued: Yes (comment) Required Braces or Orthoses: Sling Restrictions Weight Bearing Restrictions: Yes RUE Weight Bearing: Non weight bearing   Pertinent Vitals/Pain Denies pain at this time. No new complaints.      Mobility  Bed Mobility Bed Mobility: Supine to Sit Supine to Sit: 6: Modified independent (Device/Increase time) Details for Bed Mobility Assistance: pt demo good technique Transfers Transfers: Sit to Stand;Stand to Sit Sit to Stand: 6: Modified independent (Device/Increase time);From bed Stand to Sit: 6: Modified independent (Device/Increase time);To bed Details for Transfer  Assistance: pt demo good technique and was ready to get walking Ambulation/Gait Ambulation/Gait Assistance: 5: Supervision Ambulation Distance (Feet): 200 Feet Assistive device: None Ambulation/Gait Assistance Details: pt unsteady at times when performing dynamic balance activies (Ie head turns laterally and up/down); pt refusing the need of cane or AD in L UE.  Gait Pattern: Within Functional Limits Gait velocity: with increased becomes unsteady Stairs: No Wheelchair Mobility Wheelchair Mobility: No         PT Diagnosis:    PT Problem List:   PT Treatment Interventions:       PT Goals(Current goals can be found in the care plan section) Acute Rehab PT Goals Patient Stated Goal: to pee and leave PT Goal Formulation: No goals set, d/c therapy  Visit Information  Last PT Received On: 05/17/13 Assistance Needed: +1 History of Present Illness: Pt is a 62 y.o. OPEN REDUCTION OF RIGHT HUMERAL HEAD, SUBSCAPULARUS FIXATION AND BIOTENDONESIS REPAIR (Right)       Prior Functioning  Home Living Family/patient expects to be discharged to:: Private residence Living Arrangements: Spouse/significant other Available Help at Discharge: Family;Available 24 hours/day Type of Home: House Home Access: Ramped entrance Home Layout: Two level;Able to live on main level with bedroom/bathroom Home Equipment: Bedside commode;Walker - 2 wheels;Wheelchair - power Prior Function Level of Independence: Independent Communication Communication: No difficulties Dominant Hand: Right    Cognition  Cognition Arousal/Alertness: Awake/alert Behavior During Therapy: WFL for tasks assessed/performed Overall Cognitive Status: Within Functional Limits for tasks assessed    Extremity/Trunk Assessment Upper Extremity Assessment Upper Extremity Assessment: Defer to OT evaluation Lower Extremity Assessment Lower Extremity Assessment: Overall WFL for tasks assessed   Balance Balance Balance Assessed:  Yes Static Standing Balance Static Standing - Balance Support: No upper extremity supported;During functional activity Static Standing - Level of Assistance: 6: Modified independent (Device/Increase time) High Level Balance High Level Balance Activites: Head turns;Sudden stops;Turns High Level Balance Comments: pt unsteady at times but no LOB noted   End of Session PT - End of Session Equipment Utilized During Treatment: Gait belt;Other (comment) (R UE shoulder sling) Activity Tolerance: Patient tolerated treatment well Patient left: in bed;with family/visitor present Nurse Communication: Mobility status  GP     Donell Sievert, [place] [phone] 05/17/2013, 1:01 PM

## 2013-05-17 NOTE — Evaluation (Signed)
Occupational Therapy Evaluation Patient Details Name: [name] MRN: [id number] DOB: [birth year] Today's Date: 05/17/2013 Time: 1191-4782 OT Time Calculation (min): 45 min  OT Assessment / Plan / Recommendation History of present illness Pt is a 62 y.o. OPEN REDUCTION OF RIGHT HUMERAL HEAD, SUBSCAPULARUS FIXATION AND BIOTENDONESIS REPAIR (Right)   Clinical Impression   Pt presents with ORIF of right humeral head, subscapularis fixation and biotendonesis repair. Pt did well during evaluation. OT covered all shoulder information. Pt and wife comfortable with all information covered.     OT Assessment  Patient does not need any further OT services    Follow Up Recommendations  No OT follow up;Supervision/Assistance - 24 hour    Barriers to Discharge      Equipment Recommendations  None recommended by OT    Recommendations for Other Services    Frequency       Precautions / Restrictions Precautions Precautions: Shoulder Type of Shoulder Precautions: ADL education, Sling education Shoulder Interventions: Shoulder sling/immobilizer (off for dressing and bathing ) Precaution Booklet Issued: Yes (comment) Required Braces or Orthoses: Sling Restrictions Weight Bearing Restrictions: Yes RUE Weight Bearing: Non weight bearing   Pertinent Vitals/Pain Pain 3/10. Repositioned.     ADL  Toilet Transfer: Research scientist (life sciences) Method: Sit to Barista: Comfort height toilet Tub/Shower Transfer: Landscape architect Method: Science writer: Walk in Scientist, research (physical sciences) Used: Gait belt;Other (comment) (shoulder sling) Transfers/Ambulation Related to ADLs: Supervision- pt lost balance on one occassion but self corrected. Cues for pt to slow down when walking for safety. ADL Comments: Reviewed all shoulder information with pt and spouse. Performed AROM of wrist and hand.  Practiced simulated  shower transfer- supervision level for safety. Pt and spouse felt comfortable with all information covered.     OT Diagnosis:    OT Problem List:   OT Treatment Interventions:     OT Goals(Current goals can be found in the care plan section)    Visit Information  Last OT Received On: 05/17/13 Assistance Needed: +1 History of Present Illness: Pt is a 62 y.o. OPEN REDUCTION OF RIGHT HUMERAL HEAD, SUBSCAPULARUS FIXATION AND BIOTENDONESIS REPAIR (Right)       Prior Functioning     Home Living Family/patient expects to be discharged to:: Private residence Living Arrangements: Spouse/significant other Available Help at Discharge: Family;Available 24 hours/day Type of Home: Apartment Home Access: Ramped entrance Home Layout: Two level;Able to live on main level with bedroom/bathroom Home Equipment: Bedside commode;Walker - 2 wheels;Wheelchair - power Prior Function Level of Independence: Independent Communication Communication: No difficulties Dominant Hand: Right         Vision/Perception     Cognition  Cognition Arousal/Alertness: Awake/alert Behavior During Therapy: WFL for tasks assessed/performed Overall Cognitive Status: Within Functional Limits for tasks assessed    Extremity/Trunk Assessment Upper Extremity Assessment Upper Extremity Assessment: RUE deficits/detail RUE Deficits / Details: unable to fully assess due to OPEN REDUCTION OF RIGHT HUMERAL HEAD, SUBSCAPULARUS FIXATION AND BIOTENDONESIS REPAIR (Right)     Mobility Bed Mobility Bed Mobility: Supine to Sit;Sit to Supine Supine to Sit: 6: Modified independent (Device/Increase time) Sit to Supine: 6: Modified independent (Device/Increase time) Transfers Transfers: Sit to Stand;Stand to Sit Sit to Stand: 5: Supervision;From bed;From toilet Stand to Sit: 5: Supervision;To bed;To toilet     Exercise Shoulder Exercises Wrist Flexion: AROM;Right;10 reps;Seated Wrist Extension: AROM;Right;10  reps;Seated Digit Composite Flexion: AROM;Right;10 reps;Seated Composite Extension: AROM;Right;10 reps;Seated Neck Flexion: AROM;10 reps;Seated Neck Extension: AROM;10 reps;Seated Donning/doffing  shirt without moving shoulder: Patient able to independently direct caregiver Method for sponge bathing under operated UE: Patient able to independently direct caregiver Donning/doffing sling/immobilizer: Caregiver independent with task Correct positioning of sling/immobilizer: Caregiver independent with task ROM for elbow, wrist and digits of operated UE: Supervision/safety Sling wearing schedule (on at all times/off for ADL's): Patient able to independently direct caregiver Proper positioning of operated UE when showering: Caregiver independent with task Positioning of UE while sleeping: Patient able to independently direct caregiver   Balance     End of Session OT - End of Session Equipment Utilized During Treatment: Gait belt;Other (comment) (shoulder sling) Activity Tolerance: Patient tolerated treatment well Patient left: in bed;with family/visitor present  GO     Earlie Raveling OTR/L [phone] 05/17/2013, 9:32 AM

## 2013-05-17 NOTE — Op Note (Signed)
NAME:  [name], [name] NO.:  [id number]  MEDICAL RECORD NO.:  [id number]  LOCATION:  [id number]                        FACILITY:  [facility]  PHYSICIAN:  Burnard Bunting, M.D.    DATE OF BIRTH:  [birth year]  DATE OF PROCEDURE: DATE OF DISCHARGE:                              OPERATIVE REPORT   PREOPERATIVE DIAGNOSIS:  Irreducible right shoulder dislocation.  POSTOPERATIVE DIAGNOSIS:  Irreducible right shoulder dislocation with avulsion of subscapularis with tear of the capsule through the inferior portion, avulsion of the supraspinatus and infraspinatus, and dislocation of the biceps tendon, and subcoracoid dislocation of the humeral head.  PROCEDURE:  Open reduction of acute glenohumeral dislocation with repair of subscapularis and biceps tenodesis through one incision, repair of infraspinatus and supraspinatus through a second incision. Capsular repair and capsulorhaphy ASSISTANT:  Skip Mayer, MD.  ANESTHESIA:  General endotracheal.  ESTIMATED BLOOD LOSS:  100 mL.  DRAINS:  None.  INDICATIONS:  [name] is a patient with right shoulder pain following an accident, irreducible dislocation of the shoulder on the right-hand side was present in the emergency room.  MRI scan demonstrated subcoracoid dislocation with avulsion of all the humeral ligaments and blockage of the reduction through the __________ subscapularis and biceps interposition.  PROCEDURE IN DETAIL:  The patient was brought to the operating room where general endotracheal anesthesia was induced.  Preop antibiotics administered.  Time-out was called.  The patient was placed in a beach- chair position with the head in neutral position.  SCDs applied to bilateral lower extremities.  Right arm and shoulder were prescrubbed with alcohol, Betadine, allowed to air dry, prepped with DuraPrep solution and draped in a sterile manner.  Ioban used to cover the operative field.  Deltopectoral approach was  then made.  The skin and subcutaneous tissue was sharply divided.  Cephalic vein was identified. Mobilized with the deltoid laterally.  All normal anatomy was distorted. The patient had avulsed his rotator cuff, infraspinatus, supraspinatus, and subscap as a sleeve.  The humeral head had dislocated through the tear of the inferior capsule and inferior subscapularis.  The biceps tendon and remaining portion of the subscap remained attached, but the biceps tendon was subluxated posteriorly around the humeral head preventing relocation.  Once these normal anatomic landmarks were reestablished with a significant effort to actually achieve reduction of the primarily inferior dislocation, the biceps tendon was tracked up to the labrum and then it was released from its attachment on the superior aspect of the glenoid.  It was packed.  At this time, axillary nerve was palpated and found to be intact.  The head was relocated and found to be relatively stable.  At this time, tagging sutures was placed in both the capsule inferiorly to reattach in the typical location where HAGL lesion occurs as well as the subscapularis.  Once the biceps tendon was released and tagged, then subscapularis tendon was tagged and the capsule was tagged.  Attention was directed towards the infraspinatus and supraspinatus through a separate incision.  Deltoid split was made midway between the anterior and posterior margin of the acromion.  Skin and subcutaneous tissue were sharply divided.  Deltoid split was measured to a distance of  4 cm.  The rotator cuff __________ was tagged with sutures.  Three 5.5 corkscrew suture anchors were then placed and 12 sutures was placed through the rotator cuff tendon and this was then tacked down over the native footprint area and was secured using two 4.5 PushLock.  This gave excellent repair of the infraspinatus and supraspinatus up to the anterior edge of the greater  tuberosity. Attention was then redirected towards the anterior portion of the shoulder where the subscapularis tendon was repaired using two 4.5 corkscrew anchors.  The inferior corkscrew anchor first was used with the 4 sutures to secure the capsule back to the junction of the neck and head.  Then, the inferior rent in the subscapularis was repaired using 4 free limbs and then sutures were placed in mattress fashion.  Above this suture anchor, another 4.5 suture anchor was placed just on the medial aspect of the subscap attachment from the lesser tuberosity.  The 4 suture limbs were passed through a mattress fashion and then secured into the bicipital groove using another 4.5 PushLock anchor.  The biceps tenodesis was then achieved using 2 mattress sutures from this PushLock. Good fixation was achieved with appropriate tension.  When all this completed, the patient did have a watertight repair and stable shoulder. Both incisions were thoroughly irrigated and closed using interrupted inverted 0 Vicryl suture, 2-0 Vicryl suture, and skin staples.  Mepilex dressing and shoulder immobilizer was placed.  Skip Mayer' assistance was required at all times during the case for retraction, opening and closing, limb positioning, and suture management.  His assistance was a medical necessity.     Burnard Bunting, M.D.     GSD/MEDQ  D:  05/16/2013  T:  05/17/2013  Job:  [id number]

## 2013-05-18 ENCOUNTER — Encounter (HOSPITAL_COMMUNITY): Payer: Self-pay | Admitting: Orthopedic Surgery

## 2013-06-05 NOTE — Discharge Summary (Signed)
Physician Discharge Summary  Patient ID: [name] MRN: [id number] DOB/AGE: 1951-09-08 62 y.o.  Admit date: 05/15/2013 Discharge date: 05/17/2013  Admission Diagnoses:  Irreducible shoulder dislocation  Discharge Diagnoses:  Same  Surgeries: Procedure(s): OPEN REDUCTION OF RIGHT HUMERAL HEAD, SUBSCAPULARUS FIXATION AND BIOTENDONESIS REPAIR on 05/15/2013 - 05/16/2013   Consultants:    Discharged Condition: Stable  Hospital Course: [name] is an 62 y.o. male who was admitted 05/15/2013 with a chief complaint of  Chief Complaint  Patient presents with  . Shoulder Injury  , and found to have a diagnosis of irreducible shoulder dislocation.  They were brought to the operating room on 05/15/2013 - 05/16/2013 and underwent the above named procedures.He was seen by PT and mobilized oob with arm in sling. Pain controlled by time of discharge.    Antibiotics given:  Anti-infectives   Start     Dose/Rate Route Frequency Ordered Stop   05/16/13 1830  ceFAZolin (ANCEF) IVPB 2 g/50 mL premix     2 g 100 mL/hr over 30 Minutes Intravenous 3 times per day 05/16/13 1742 05/17/13 0649   05/16/13 1530  ceFAZolin (ANCEF) IVPB 2 g/50 mL premix     2 g 100 mL/hr over 30 Minutes Intravenous  Once 05/16/13 1426 05/16/13 1428   05/16/13 0600  ceFAZolin (ANCEF) IVPB 2 g/50 mL premix     2 g 100 mL/hr over 30 Minutes Intravenous On call to O.R. 05/16/13 0865 05/16/13 1115    .  Recent vital signs:  Filed Vitals:   05/17/13 0708  BP: 99/62  Pulse: 66  Temp: 98.2 F (36.8 C)  Resp: 14    Recent laboratory studies:  Results for orders placed during the hospital encounter of 05/15/13  SURGICAL PCR SCREEN      Result Value Range   MRSA, PCR NEGATIVE  NEGATIVE   Staphylococcus aureus NEGATIVE  NEGATIVE  CBC WITH DIFFERENTIAL      Result Value Range   WBC 11.6 (*) 4.0 - 10.5 K/uL   RBC 4.81  4.22 - 5.81 MIL/uL   Hemoglobin 14.4  13.0 - 17.0 g/dL   HCT 78.4  69.6 - 29.5 %   MCV 90.4   78.0 - 100.0 fL   MCH 29.9  26.0 - 34.0 pg   MCHC 33.1  30.0 - 36.0 g/dL   RDW 28.4  13.2 - 44.0 %   Platelets 185  150 - 400 K/uL   Neutrophils Relative % 70  43 - 77 %   Neutro Abs 8.1 (*) 1.7 - 7.7 K/uL   Lymphocytes Relative 15  12 - 46 %   Lymphs Abs 1.7  0.7 - 4.0 K/uL   Monocytes Relative 14 (*) 3 - 12 %   Monocytes Absolute 1.6 (*) 0.1 - 1.0 K/uL   Eosinophils Relative 1  0 - 5 %   Eosinophils Absolute 0.1  0.0 - 0.7 K/uL   Basophils Relative 0  0 - 1 %   Basophils Absolute 0.1  0.0 - 0.1 K/uL  BASIC METABOLIC PANEL      Result Value Range   Sodium 141  135 - 145 mEq/L   Potassium 3.6  3.5 - 5.1 mEq/L   Chloride 104  96 - 112 mEq/L   CO2 25  19 - 32 mEq/L   Glucose, Bld 141 (*) 70 - 99 mg/dL   BUN 12  6 - 23 mg/dL   Creatinine, Ser 1.02  0.50 - 1.35 mg/dL   Calcium  8.5  8.4 - 10.5 mg/dL   GFR calc non Af Amer >90  >90 mL/min   GFR calc Af Amer >90  >90 mL/min    Discharge Medications:     Medication List         HYDROmorphone 4 MG tablet  Commonly known as:  DILAUDID  Take 1 tablet (4 mg total) by mouth every 3 (three) hours as needed.     methocarbamol 500 MG tablet  Commonly known as:  ROBAXIN  Take 1 tablet (500 mg total) by mouth every 6 (six) hours as needed.     methylphenidate 20 MG tablet  Commonly known as:  RITALIN  Take 20 mg by mouth daily as needed.     pravastatin 40 MG tablet  Commonly known as:  PRAVACHOL  Take 40 mg by mouth daily.        Diagnostic Studies: Dg Shoulder Right  05/15/2013   *RADIOLOGY REPORT*  Clinical Data: Shoulder injury  RIGHT SHOULDER - 2+ VIEW  Comparison: None  Findings: Anterior shoulder dislocation is identified.  No dislocations identified.  No radiopaque foreign bodies or soft tissue calcification.  Suture anchors identified within the humeral head.  IMPRESSION:  1.  Anterior shoulder dislocation.   Original Report Authenticated By: Signa Kell, M.D.   Mr Shoulder Right Wo Contrast  05/16/2013   *RADIOLOGY  REPORT*  Clinical Data:  Non reducible anterior dislocation of the right humeral head.  MRI OF THE RIGHT SHOULDER WITHOUT CONTRAST  Technique:  Multiplanar, multisequence MR imaging of the right shoulder was performed.  No intravenous contrast was administered.  Comparison:  Radiographs dated 05/15/2013  FINDINGS: The subscapularis, supraspinatus, and infraspinatus tendons are avulsed from the humeral head where are still attached to one another.  The teres minor tendon is still attached to the humeral head.  The humeral head is anteriorly and medially dislocated completely anterior to the glenoid and under the coracoid.  The subscapularis tendon lies posterior to the humeral head.  The long head of the biceps tendon is dislocated from the bicipital groove and lies posterior and lateral to the dislocated humeral head.  The avulsed infraspinatus and supraspinatus tendons lie in the glenoid fossa and the teres minor tendon passes along the inferior rim of the glenoid.  There are no fractures.  No Hill-Sachs lesion.  Extensive edema and hemorrhage in the soft tissues.  Anchors are seen in the humeral head from previous rotator cuff repair.  IMPRESSION: Atypical anterior dislocation of the right humeral head.  The humeral head is completely anterior to the glenoid and under the coracoid with interposition of the subscapularis and long head of the biceps tendon between the humeral head and the joint.  The avulsed subscapularis, infraspinatus and supraspinatus tendons lie within the glenohumeral joint attached to one another.   Original Report Authenticated By: Francene Boyers, M.D.   Dg Shoulder Right Port  05/16/2013   *RADIOLOGY REPORT*  Clinical Data: Postoperative for open reduction of the right humeral head with subscapularis fixation and biceps tenodesis, and repair of the infraspinatus and supraspinatus muscles.  PORTABLE RIGHT SHOULDER - 2+ VIEW  Comparison: 05/15/2013  Findings: Glenohumeral alignment normal on  this internally rotated projection.  Mitek type anchors are again noted over the greater tuberosity.  Skin staples noted.  No underlying fracture observed.  IMPRESSION:  1.  Successful reduction.   Original Report Authenticated By: Gaylyn Rong, M.D.   Dg Shoulder Right Port  05/15/2013   *RADIOLOGY REPORT*  Clinical  Data:  Shoulder dislocation post attempted reduction  PORTABLE RIGHT SHOULDER - 2+ VIEW  Comparison: Portable exam 2001 hours compared to earlier study of 1840 hours  Findings: Persistent anterior sub coracoid glenohumeral dislocation. No fractures identified.  IMPRESSION: Persistent anterior right glenohumeral dislocation.   Original Report Authenticated By: Ulyses Southward, M.D.    Disposition: 01-Home or Self Care      Discharge Orders   Future Orders Complete By Expires     Call MD / Call 911  As directed     Comments:      If you experience chest pain or shortness of breath, CALL 911 and be transported to the hospital emergency room.  If you develope a fever above 101 F, pus (white drainage) or increased drainage or redness at the wound, or calf pain, call your surgeon's office.    Constipation Prevention  As directed     Comments:      Drink plenty of fluids.  Prune juice may be helpful.  You may use a stool softener, such as Colace (over the counter) 100 mg twice a day.  Use MiraLax (over the counter) for constipation as needed.    Diet - low sodium heart healthy  As directed     Discharge instructions  As directed     Comments:      Keep arm in sling Ok to bend elbow and straighten daily Keep incisions dry for one week then ok to shower only    Increase activity slowly as tolerated  As directed           Signed: Mirranda Monrroy SCOTT 06/05/2013, 3:29 PM

## 2013-08-04 ENCOUNTER — Ambulatory Visit (INDEPENDENT_AMBULATORY_CARE_PROVIDER_SITE_OTHER): Payer: 59 | Admitting: General Surgery

## 2013-08-04 ENCOUNTER — Encounter (INDEPENDENT_AMBULATORY_CARE_PROVIDER_SITE_OTHER): Payer: Self-pay | Admitting: General Surgery

## 2013-08-04 VITALS — BP 140/90 | HR 80 | Temp 98.0°F | Resp 14 | Ht 69.0 in | Wt 187.8 lb

## 2013-08-04 DIAGNOSIS — K4091 Unilateral inguinal hernia, without obstruction or gangrene, recurrent: Secondary | ICD-10-CM

## 2013-08-04 NOTE — Progress Notes (Signed)
The patient comes in today doing very well status post laparoscopic cholecystectomy. He is actually here to evaluate him for his recurrent right inguinal hernia repair. His last right inguinal hernia repair was done approximately 10-15 years ago likely at the surgical Center in [place].  He has known about this hernia for these past 2 years. Has become increasingly more symptomatic.  Examination today in the standing position you can see slight swelling and asymmetry on the right side with a palpable hernia on digital examination through the scrotum. It seems likely to be either a low direct hernia or femoral hernia. Open repair is recommended.  ) Schedule patient for an open right hernia repair recurrent hernia. This will be used. The patient will likely get 6 weeks of disability. We'll schedule as soon as possible. The patient wants somewhere near the middle of October.

## 2013-08-18 ENCOUNTER — Encounter (HOSPITAL_BASED_OUTPATIENT_CLINIC_OR_DEPARTMENT_OTHER): Payer: Self-pay | Admitting: *Deleted

## 2013-08-18 NOTE — Progress Notes (Signed)
Pt had lap choli 4/14,fx rt shoulder 7/14,had ekg done 7/14 To come in for [facility] labs

## 2013-08-19 ENCOUNTER — Encounter (HOSPITAL_BASED_OUTPATIENT_CLINIC_OR_DEPARTMENT_OTHER)
Admission: RE | Admit: 2013-08-19 | Discharge: 2013-08-19 | Disposition: A | Discharge status: Home / Self Care | Payer: 59 | Source: Ambulatory Visit | Attending: General Surgery | Admitting: General Surgery

## 2013-08-19 DIAGNOSIS — Z01812 Encounter for preprocedural laboratory examination: Secondary | ICD-10-CM | POA: Insufficient documentation

## 2013-08-19 LAB — CBC WITH DIFFERENTIAL/PLATELET
Basophils Absolute: 0.1 10*3/uL (ref 0.0–0.1)
Basophils Relative: 1 % (ref 0–1)
Eosinophils Absolute: 0.3 10*3/uL (ref 0.0–0.7)
Eosinophils Relative: 3 % (ref 0–5)
HCT: 44.6 % (ref 39.0–52.0)
Lymphocytes Relative: 28 % (ref 12–46)
MCH: 28.5 pg (ref 26.0–34.0)
MCHC: 33.4 g/dL (ref 30.0–36.0)
MCV: 85.3 fL (ref 78.0–100.0)
Monocytes Absolute: 1 10*3/uL (ref 0.1–1.0)
Platelets: 236 10*3/uL (ref 150–400)
RDW: 14.2 % (ref 11.5–15.5)

## 2013-08-19 LAB — BASIC METABOLIC PANEL
BUN: 15 mg/dL (ref 6–23)
CO2: 24 mEq/L (ref 19–32)
Calcium: 9 mg/dL (ref 8.4–10.5)
Chloride: 102 mEq/L (ref 96–112)
Creatinine, Ser: 1.02 mg/dL (ref 0.50–1.35)
Glucose, Bld: 107 mg/dL — ABNORMAL HIGH (ref 70–99)

## 2013-08-21 ENCOUNTER — Encounter (HOSPITAL_BASED_OUTPATIENT_CLINIC_OR_DEPARTMENT_OTHER): Payer: Self-pay | Admitting: *Deleted

## 2013-08-24 ENCOUNTER — Encounter (HOSPITAL_BASED_OUTPATIENT_CLINIC_OR_DEPARTMENT_OTHER): Payer: Self-pay

## 2013-08-24 ENCOUNTER — Ambulatory Visit (HOSPITAL_BASED_OUTPATIENT_CLINIC_OR_DEPARTMENT_OTHER)
Admission: RE | Admit: 2013-08-24 | Discharge: 2013-08-24 | Disposition: A | Discharge status: Home / Self Care | Payer: 59 | Source: Ambulatory Visit | Attending: General Surgery | Admitting: General Surgery

## 2013-08-24 ENCOUNTER — Ambulatory Visit (INDEPENDENT_AMBULATORY_CARE_PROVIDER_SITE_OTHER): Payer: 59 | Admitting: General Surgery

## 2013-08-24 ENCOUNTER — Encounter (HOSPITAL_BASED_OUTPATIENT_CLINIC_OR_DEPARTMENT_OTHER): Payer: 59 | Admitting: Anesthesiology

## 2013-08-24 ENCOUNTER — Ambulatory Visit (HOSPITAL_BASED_OUTPATIENT_CLINIC_OR_DEPARTMENT_OTHER): Payer: 59 | Admitting: Anesthesiology

## 2013-08-24 ENCOUNTER — Other Ambulatory Visit: Payer: Self-pay | Admitting: Family Medicine

## 2013-08-24 ENCOUNTER — Encounter (HOSPITAL_BASED_OUTPATIENT_CLINIC_OR_DEPARTMENT_OTHER)
Admission: RE | Disposition: A | Discharge status: Home / Self Care | Payer: Self-pay | Source: Ambulatory Visit | Attending: General Surgery

## 2013-08-24 VITALS — BP 122/82 | HR 90 | Temp 98.1°F | Resp 18

## 2013-08-24 DIAGNOSIS — K4091 Unilateral inguinal hernia, without obstruction or gangrene, recurrent: Secondary | ICD-10-CM

## 2013-08-24 DIAGNOSIS — T888XXA Other specified complications of surgical and medical care, not elsewhere classified, initial encounter: Secondary | ICD-10-CM

## 2013-08-24 DIAGNOSIS — D176 Benign lipomatous neoplasm of spermatic cord: Secondary | ICD-10-CM | POA: Insufficient documentation

## 2013-08-24 DIAGNOSIS — E7800 Pure hypercholesterolemia, unspecified: Secondary | ICD-10-CM | POA: Insufficient documentation

## 2013-08-24 DIAGNOSIS — IMO0002 Reserved for concepts with insufficient information to code with codable children: Secondary | ICD-10-CM | POA: Insufficient documentation

## 2013-08-24 HISTORY — PX: INSERTION OF MESH: SHX5868

## 2013-08-24 HISTORY — PX: INGUINAL HERNIA REPAIR: SHX194

## 2013-08-24 HISTORY — DX: Presence of dental prosthetic device (complete) (partial): Z97.2

## 2013-08-24 HISTORY — DX: Presence of spectacles and contact lenses: Z97.3

## 2013-08-24 SURGERY — REPAIR, HERNIA, INGUINAL, ADULT
Anesthesia: General | Site: Abdomen | Laterality: Right | Wound class: Clean

## 2013-08-24 MED ORDER — HYDROMORPHONE HCL 4 MG PO TABS: 4.0000 mg | ORAL_TABLET | ORAL | Status: DC | PRN

## 2013-08-24 MED ORDER — HYDROMORPHONE HCL PF 1 MG/ML IJ SOLN
0.2500 mg | INTRAMUSCULAR | Status: DC | PRN
  Administered 2013-08-24 (×4): 0.5 mg via INTRAVENOUS
  Administered 2013-08-24: 0.25 mg via INTRAVENOUS

## 2013-08-24 MED ORDER — MIDAZOLAM HCL 2 MG/2ML IJ SOLN
0.5000 mg | Freq: Once | INTRAMUSCULAR | Status: AC | PRN
  Administered 2013-08-24: 1 mg via INTRAVENOUS

## 2013-08-24 MED ORDER — SUCCINYLCHOLINE CHLORIDE 20 MG/ML IJ SOLN
INTRAMUSCULAR | Status: DC | PRN
  Administered 2013-08-24: 100 mg via INTRAVENOUS

## 2013-08-24 MED ORDER — LIDOCAINE HCL (CARDIAC) 20 MG/ML IV SOLN
INTRAVENOUS | Status: DC | PRN
  Administered 2013-08-24: 100 mg via INTRAVENOUS

## 2013-08-24 MED ORDER — PROPOFOL 10 MG/ML IV BOLUS
INTRAVENOUS | Status: DC | PRN
  Administered 2013-08-24: 250 mg via INTRAVENOUS

## 2013-08-24 MED ORDER — KETOROLAC TROMETHAMINE 30 MG/ML IJ SOLN
30.0000 mg | Freq: Once | INTRAMUSCULAR | Status: AC
  Administered 2013-08-24: 30 mg via INTRAVENOUS

## 2013-08-24 MED ORDER — PROMETHAZINE HCL 25 MG/ML IJ SOLN: 6.2500 mg | INTRAMUSCULAR | Status: DC | PRN

## 2013-08-24 MED ORDER — MIDAZOLAM HCL 2 MG/2ML IJ SOLN
1.0000 mg | INTRAMUSCULAR | Status: DC | PRN
  Administered 2013-08-24: 3 mg via INTRAVENOUS

## 2013-08-24 MED ORDER — FENTANYL CITRATE 0.05 MG/ML IJ SOLN
INTRAMUSCULAR | Status: DC | PRN
  Administered 2013-08-24: 50 ug via INTRAVENOUS
  Administered 2013-08-24 (×2): 25 ug via INTRAVENOUS

## 2013-08-24 MED ORDER — PRAVASTATIN SODIUM 40 MG PO TABS: 40.0000 mg | ORAL_TABLET | Freq: Every day | ORAL | Status: DC

## 2013-08-24 MED ORDER — SODIUM CHLORIDE 0.9 % IR SOLN
Status: DC | PRN
  Administered 2013-08-24: 12:00:00

## 2013-08-24 MED ORDER — FENTANYL CITRATE 0.05 MG/ML IJ SOLN
50.0000 ug | Freq: Once | INTRAMUSCULAR | Status: AC
  Administered 2013-08-24: 150 ug via INTRAVENOUS

## 2013-08-24 MED ORDER — CEFAZOLIN SODIUM-DEXTROSE 2-3 GM-% IV SOLR
2.0000 g | INTRAVENOUS | Status: AC
  Administered 2013-08-24: 2 g via INTRAVENOUS

## 2013-08-24 MED ORDER — BUPIVACAINE-EPINEPHRINE PF 0.5-1:200000 % IJ SOLN
INTRAMUSCULAR | Status: DC | PRN
  Administered 2013-08-24: 35 mL

## 2013-08-24 MED ORDER — CHLORHEXIDINE GLUCONATE 4 % EX LIQD: 1.0000 "application " | Freq: Once | CUTANEOUS | Status: DC

## 2013-08-24 MED ORDER — DEXAMETHASONE SODIUM PHOSPHATE 4 MG/ML IJ SOLN
INTRAMUSCULAR | Status: DC | PRN
  Administered 2013-08-24: 10 mg via INTRAVENOUS
  Administered 2013-08-24: 4 mg

## 2013-08-24 MED ORDER — LACTATED RINGERS IV SOLN
INTRAVENOUS | Status: DC
  Administered 2013-08-24 (×2): via INTRAVENOUS

## 2013-08-24 MED ORDER — ONDANSETRON HCL 4 MG/2ML IJ SOLN
INTRAMUSCULAR | Status: DC | PRN
  Administered 2013-08-24: 4 mg via INTRAMUSCULAR

## 2013-08-24 SURGICAL SUPPLY — 58 items
ADH SKN CLS APL DERMABOND .7 (GAUZE/BANDAGES/DRESSINGS) ×1
BAG DECANTER FOR FLEXI CONT (MISCELLANEOUS) ×2 IMPLANT
BLADE SURG 10 STRL SS (BLADE) ×2 IMPLANT
BLADE SURG 15 STRL LF DISP TIS (BLADE) ×2 IMPLANT
BLADE SURG 15 STRL SS (BLADE) ×4
BLADE SURG ROTATE 9660 (MISCELLANEOUS) ×2 IMPLANT
CANISTER SUCT 1200ML W/VALVE (MISCELLANEOUS) IMPLANT
CHLORAPREP W/TINT 26ML (MISCELLANEOUS) ×2 IMPLANT
CLEANER CAUTERY TIP 5X5 PAD (MISCELLANEOUS) ×1 IMPLANT
CLOTH BEACON ORANGE TIMEOUT ST (SAFETY) ×2 IMPLANT
COVER MAYO STAND STRL (DRAPES) ×2 IMPLANT
COVER TABLE BACK 60X90 (DRAPES) ×2 IMPLANT
DECANTER SPIKE VIAL GLASS SM (MISCELLANEOUS) ×2 IMPLANT
DERMABOND ADVANCED (GAUZE/BANDAGES/DRESSINGS) ×1
DERMABOND ADVANCED .7 DNX12 (GAUZE/BANDAGES/DRESSINGS) ×1 IMPLANT
DRAIN PENROSE 1/2X12 LTX STRL (WOUND CARE) ×2 IMPLANT
DRAPE LAPAROTOMY TRNSV 102X78 (DRAPE) ×2 IMPLANT
DRAPE UTILITY XL STRL (DRAPES) ×2 IMPLANT
DRSG TEGADERM 2-3/8X2-3/4 SM (GAUZE/BANDAGES/DRESSINGS) IMPLANT
DRSG TEGADERM 4X4.75 (GAUZE/BANDAGES/DRESSINGS) ×2 IMPLANT
ELECT REM PT RETURN 9FT ADLT (ELECTROSURGICAL) ×2
ELECTRODE REM PT RTRN 9FT ADLT (ELECTROSURGICAL) ×1 IMPLANT
GLOVE BIO SURGEON STRL SZ 6.5 (GLOVE) ×1 IMPLANT
GLOVE BIOGEL M STRL SZ7.5 (GLOVE) ×2 IMPLANT
GLOVE BIOGEL PI IND STRL 7.0 (GLOVE) IMPLANT
GLOVE BIOGEL PI IND STRL 8 (GLOVE) ×1 IMPLANT
GLOVE BIOGEL PI INDICATOR 7.0 (GLOVE) ×1
GLOVE BIOGEL PI INDICATOR 8 (GLOVE) ×3
GLOVE ECLIPSE 6.5 STRL STRAW (GLOVE) ×1 IMPLANT
GLOVE ECLIPSE 7.5 STRL STRAW (GLOVE) ×2 IMPLANT
GOWN PREVENTION PLUS XLARGE (GOWN DISPOSABLE) ×3 IMPLANT
GOWN STRL REIN 2XL LVL4 (GOWN DISPOSABLE) ×2 IMPLANT
MESH HERNIA 3X6 (Mesh General) ×2 IMPLANT
NDL HYPO 25X1 1.5 SAFETY (NEEDLE) ×1 IMPLANT
NEEDLE HYPO 25X1 1.5 SAFETY (NEEDLE) ×2 IMPLANT
NS IRRIG 1000ML POUR BTL (IV SOLUTION) IMPLANT
PACK BASIN DAY SURGERY FS (CUSTOM PROCEDURE TRAY) ×2 IMPLANT
PAD CLEANER CAUTERY TIP 5X5 (MISCELLANEOUS) ×1
PENCIL BUTTON HOLSTER BLD 10FT (ELECTRODE) ×2 IMPLANT
SLEEVE SCD COMPRESS KNEE MED (MISCELLANEOUS) ×1 IMPLANT
SPONGE INTESTINAL PEANUT (DISPOSABLE) ×2 IMPLANT
SPONGE LAP 4X18 X RAY DECT (DISPOSABLE) ×3 IMPLANT
STRIP CLOSURE SKIN 1/2X4 (GAUZE/BANDAGES/DRESSINGS) ×2 IMPLANT
SUT ETHIBOND 0 MO6 C/R (SUTURE) ×2 IMPLANT
SUT MON AB 4-0 PC3 18 (SUTURE) ×2 IMPLANT
SUT PROLENE 0 CT 2 (SUTURE) ×4 IMPLANT
SUT SILK 2 0 TIES 17X18 (SUTURE) ×2
SUT SILK 2-0 18XBRD TIE BLK (SUTURE) IMPLANT
SUT VIC AB 3-0 SH 27 (SUTURE) ×4
SUT VIC AB 3-0 SH 27X BRD (SUTURE) ×2 IMPLANT
SUT VICRYL 4-0 PS2 18IN ABS (SUTURE) IMPLANT
SUT VICRYL AB 3 0 TIES (SUTURE) IMPLANT
SYR BULB 3OZ (MISCELLANEOUS) ×2 IMPLANT
SYR CONTROL 10ML LL (SYRINGE) ×2 IMPLANT
TOWEL OR 17X24 6PK STRL BLUE (TOWEL DISPOSABLE) ×2 IMPLANT
TOWEL OR NON WOVEN STRL DISP B (DISPOSABLE) ×2 IMPLANT
TUBE CONNECTING 20X1/4 (TUBING) IMPLANT
YANKAUER SUCT BULB TIP NO VENT (SUCTIONS) IMPLANT

## 2013-08-24 NOTE — Interval H&P Note (Signed)
History and Physical Interval Note:  08/24/2013 10:18 AM  [name]  has presented today for surgery, with the diagnosis of Recurrent right inguinal hernia with symptoms  The various methods of treatment have been discussed with the patient and family. After consideration of risks, benefits and other options for treatment, the patient has consented to  Procedure(s): HERNIA REPAIR INGUINAL ADULT (Right) INSERTION OF MESH (Right) as a surgical intervention .  The patient's history has been reviewed, patient examined, no change in status, stable for surgery.  I have reviewed the patient's chart and labs.  Questions were answered to the patient's satisfaction.     [name] O  Risks and benefits discussed and patient marked appropriately.    [name]. [name], MD, FACS [phone] [facility]

## 2013-08-24 NOTE — Transfer of Care (Signed)
Immediate Anesthesia Transfer of Care Note  Patient: [name]  Procedure(s) Performed: Procedure(s) with comments: HERNIA REPAIR INGUINAL ADULT (Right) - right inguinal INSERTION OF MESH (Right) - right inguinal  Patient Location: PACU  Anesthesia Type:GA combined with regional for post-op pain  Level of Consciousness: sedated and patient cooperative  Airway & Oxygen Therapy: Patient Spontanous Breathing and Patient connected to face mask oxygen  Post-op Assessment: Report given to PACU RN and Post -op Vital signs reviewed and stable  Post vital signs: Reviewed and stable  Complications: No apparent anesthesia complications

## 2013-08-24 NOTE — Op Note (Signed)
OPERATIVE REPORT  DATE OF OPERATION: 08/24/2013  PATIENT:  [name]  62 y.o. male  PRE-OPERATIVE DIAGNOSIS:  Recurrent right inguinal hernia with symptoms  POST-OPERATIVE DIAGNOSIS:  Recurrent right inguinal hernia with symptoms  PROCEDURE:  Procedure(s): HERNIA REPAIR INGUINAL ADULT INSERTION OF MESH  SURGEON:  Surgeon(s): Cherylynn Ridges, MD  ASSISTANT: None  ANESTHESIA:   general  EBL: <30 ml  BLOOD ADMINISTERED: none  DRAINS: none   SPECIMEN:  No Specimen  COUNTS CORRECT:  YES  COMPLICATIIONS:  Clamp mark on abdominal wall from impression made during dissection--clamp likely pinched the skin leaving a "V" shaped skin impression without tearing the skin.  Approximately 5.0cm to the right of the umbilicus.  This was discussed in detail with the patient's wife.  PROCEDURE DETAILS: The patient was taken to the operating room and placed on the table in the supine position. After an adequate general endotracheal anesthetic was administered, he was prepped and draped in the usual sterile manner exposing the right side and right inguinal area of his abdomen.  After a proper time out was performed identifying the patient and procedure to be performed, a right transverse curvilinear incision at the level of the right inguinal ligament was made using a #10 blade. It measured approximately 8 centimeters long. We took it down to and through the subcutaneous tissue using electrocautery. Once we got down through the Scarpa's fascia level  We could see the impression of the hernia coming through the superficial ring. There was no evidence of previous open scar at this site however the patient's prior repair was most likely done laparoscopically.  We opened the external oblique along its fibers through the superficial ring. We mobilized the spermatic cord up on a work bench using a Penrose drain. We subsequently dissected out a direct and indirect sac. We ligated the large indirect  sac at its base using 2 suture ligatures of 0 Ethibond suture.  A large lipoma of the cord was also dissected away from the spermatic cord and ligated at its base using a suture ligature of 0 Ethibond suture. The inferior epigastric vessels noted. Because of its position in the floor of the inguinal hernia this was ligated doubly proximally and distally and then transected. We then imbricated the transversalis fascia on itself in order to keep the internal contents of the direct sac inside as we subsequently placed a oval piece of mesh on top of it. 0 Ethibond was used to imbricate the transversalis fascia and we then sutured the oval piece of polypropylene mesh in place using running 0 Prolene suture. Antibiotic solution was used to irrigate the mesh. Once this was in place the spermatic cord was allowed to fall back into the floor and we oversewed the extra oblique fascia on top of the cord using 3-0 vicryl suture. Subsequently we reapproximated the Scarpa's fascia using interrupted 3-0 Vicryl suture. Because the patient had a preoperative regional block no local anesthetic was injected and closed the skin using running subcuticular stitch of 4-0 Monocryl. All needle counts, sponge counts, and instrument counts were correct.  PATIENT DISPOSITION:  PACU - hemodynamically stable.   Cherylynn Ridges 10/13/20141:27 PM

## 2013-08-24 NOTE — Progress Notes (Signed)
Assisted Dr. Kasik with right, ultrasound guided, transabdominal plane block. Side rails up, monitors on throughout procedure. See vital signs in flow sheet. Tolerated Procedure well. 

## 2013-08-24 NOTE — Telephone Encounter (Signed)
Rx Refilled  

## 2013-08-24 NOTE — Progress Notes (Signed)
Patient had surgery today on a recurrent right inguinal hernia. Postoperatively he received some Toradol for pain control. Unfortunately that possibly lead to the development of a right inguinal hematoma.  The hematoma is about the size of a large golf ball it is not tense or. There is some drainage underneath the plastic which I reinforced. I do not believe the patient requires one back to surgery for drainage. I recommended warm compresses and controlled with pain medication and staying at rest for today. I believe this should resolve on its own. If she worsens over the next 24-48 hours he should come back and see me and he may require operative intervention and drainage.

## 2013-08-24 NOTE — Anesthesia Procedure Notes (Addendum)
Anesthesia Regional Block:  TAP block  Pre-Anesthetic Checklist: ,, timeout performed, Correct Patient, Correct Site, Correct Laterality, Correct Procedure, Correct Position, site marked, Risks and benefits discussed,  Surgical consent,  Pre-op evaluation,  At surgeon's request and post-op pain management  Laterality: Right  Prep: chloraprep       Needles:   Needle Type: Echogenic Stimulator Needle      Needle Gauge: 22 and 22 G    Additional Needles:  Procedures: ultrasound guided (picture in chart) TAP block Narrative:  Start time: 08/24/2013 11:01 AM End time: 08/24/2013 11:24 AM Injection made incrementally with aspirations every 5 mL. Anesthesiologist: Dr Gypsy Balsam  Additional Notes: 4098-1191 R TAP Block POP CHG prep, sterile tech #22 echo needle with PIX in chart Multiple neg asp Vernia Buff .5% w/epi 1:200000 total 35cc+ decadron 4mg  infiltrated No compl Dr Gypsy Balsam   Procedure Name: Intubation Date/Time: 08/24/2013 11:59 AM Performed by: Gar Gibbon Pre-anesthesia Checklist: Patient identified, Emergency Drugs available, Suction available and Patient being monitored Patient Re-evaluated:Patient Re-evaluated prior to inductionOxygen Delivery Method: Circle System Utilized Preoxygenation: Pre-oxygenation with 100% oxygen Intubation Type: IV induction Ventilation: Mask ventilation without difficulty Laryngoscope Size: Mac and 3 Grade View: Grade II Tube type: Oral Number of attempts: 1 Airway Equipment and Method: stylet and oral airway Placement Confirmation: ETT inserted through vocal cords under direct vision,  positive ETCO2 and breath sounds checked- equal and bilateral Secured at: 21 cm Tube secured with: Tape Dental Injury: Teeth and Oropharynx as per pre-operative assessment

## 2013-08-24 NOTE — H&P (View-Only) (Signed)
The patient comes in today doing very well status post laparoscopic cholecystectomy. He is actually here to evaluate him for his recurrent right inguinal hernia repair. His last right inguinal hernia repair was done approximately 10-15 years ago likely at the surgical Center in [place].  He has known about this hernia for these past 2 years. Has become increasingly more symptomatic.  Examination today in the standing position you can see slight swelling and asymmetry on the right side with a palpable hernia on digital examination through the scrotum. It seems likely to be either a low direct hernia or femoral hernia. Open repair is recommended.  ) Schedule patient for an open right hernia repair recurrent hernia. This will be used. The patient will likely get 6 weeks of disability. We'll schedule as soon as possible. The patient wants somewhere near the middle of October.

## 2013-08-24 NOTE — Anesthesia Postprocedure Evaluation (Signed)
  Anesthesia Post-op Note  Patient: [name]  Procedure(s) Performed: Procedure(s) with comments: HERNIA REPAIR INGUINAL ADULT (Right) - right inguinal INSERTION OF MESH (Right) - right inguinal  Patient Location: PACU  Anesthesia Type:GA combined with regional for post-op pain  Level of Consciousness: awake and alert   Airway and Oxygen Therapy: Patient Spontanous Breathing  Post-op Pain: mild  Post-op Assessment: Post-op Vital signs reviewed, Patient's Cardiovascular Status Stable, Respiratory Function Stable, Patent Airway, No signs of Nausea or vomiting and Pain level controlled  Post-op Vital Signs: Reviewed and stable  Complications: No apparent anesthesia complications

## 2013-08-24 NOTE — Anesthesia Preprocedure Evaluation (Signed)
Anesthesia Evaluation  Patient identified by MRN, date of birth, ID band Patient awake    Reviewed: Allergy & Precautions, H&P , NPO status , Patient's Chart, lab work & pertinent test results  Airway Mallampati: II TM Distance: >3 FB Neck ROM: full    Dental   Pulmonary  breath sounds clear to auscultation        Cardiovascular Rhythm:Regular Rate:Normal  hypercholesterolemia   Neuro/Psych ADD    GI/Hepatic   Endo/Other    Renal/GU      Musculoskeletal   Abdominal   Peds  Hematology   Anesthesia Other Findings ADD  Reproductive/Obstetrics                           Anesthesia Physical Anesthesia Plan  ASA: II  Anesthesia Plan: General   Post-op Pain Management:    Induction:   Airway Management Planned: Oral ETT  Additional Equipment:   Intra-op Plan:   Post-operative Plan: Extubation in OR  Informed Consent: I have reviewed the patients History and Physical, chart, labs and discussed the procedure including the risks, benefits and alternatives for the proposed anesthesia with the patient or authorized representative who has indicated his/her understanding and acceptance.     Plan Discussed with: CRNA and Surgeon  Anesthesia Plan Comments:         Anesthesia Quick Evaluation

## 2013-08-25 ENCOUNTER — Encounter (HOSPITAL_BASED_OUTPATIENT_CLINIC_OR_DEPARTMENT_OTHER): Payer: Self-pay | Admitting: General Surgery

## 2013-08-25 ENCOUNTER — Telehealth (INDEPENDENT_AMBULATORY_CARE_PROVIDER_SITE_OTHER): Payer: Self-pay | Admitting: General Surgery

## 2013-08-25 NOTE — Telephone Encounter (Signed)
Called because of swelling and bleeding at the site of inguinal hernia repair.  Hematoma was noted by Dr. Lindie Spruce yesterday and the wound was redressed.  The patient says that it is not as hard as it was and hasnt changed significantly but did have a 1 in strip of blood on the bandage.  I recommended that he change the outer guaze and if this developed blood, or if the swelling or pain was increasing, then he should call me back or come in to ER for evaluation.

## 2013-08-27 ENCOUNTER — Encounter (INDEPENDENT_AMBULATORY_CARE_PROVIDER_SITE_OTHER): Payer: Self-pay | Admitting: General Surgery

## 2013-09-17 ENCOUNTER — Other Ambulatory Visit: Payer: Self-pay

## 2013-09-29 ENCOUNTER — Encounter (INDEPENDENT_AMBULATORY_CARE_PROVIDER_SITE_OTHER): Payer: Self-pay | Admitting: General Surgery

## 2013-09-29 ENCOUNTER — Ambulatory Visit (INDEPENDENT_AMBULATORY_CARE_PROVIDER_SITE_OTHER): Payer: 59 | Admitting: General Surgery

## 2013-09-29 VITALS — BP 128/98 | HR 80 | Temp 98.9°F | Resp 15 | Ht 69.0 in | Wt 196.2 lb

## 2013-09-29 DIAGNOSIS — Z09 Encounter for follow-up examination after completed treatment for conditions other than malignant neoplasm: Secondary | ICD-10-CM

## 2013-09-29 NOTE — Progress Notes (Signed)
The patient's hematoma has resolved. He has no evidence of recurrent hernia. He is to return to see me on an as-needed basis.  All the ecchymosis associated with the postoperative hematoma has resolved.

## 2013-12-14 ENCOUNTER — Other Ambulatory Visit: Payer: Self-pay | Admitting: Family Medicine

## 2013-12-14 DIAGNOSIS — B070 Plantar wart: Secondary | ICD-10-CM

## 2013-12-21 ENCOUNTER — Ambulatory Visit (INDEPENDENT_AMBULATORY_CARE_PROVIDER_SITE_OTHER): Payer: 59

## 2013-12-21 ENCOUNTER — Encounter: Payer: Self-pay | Admitting: Podiatry

## 2013-12-21 ENCOUNTER — Ambulatory Visit (INDEPENDENT_AMBULATORY_CARE_PROVIDER_SITE_OTHER): Payer: 59 | Admitting: Podiatry

## 2013-12-21 VITALS — BP 136/76 | HR 80 | Resp 16 | Ht 69.0 in | Wt 191.0 lb

## 2013-12-21 DIAGNOSIS — M79609 Pain in unspecified limb: Secondary | ICD-10-CM

## 2013-12-21 DIAGNOSIS — M2040 Other hammer toe(s) (acquired), unspecified foot: Secondary | ICD-10-CM

## 2013-12-21 DIAGNOSIS — M779 Enthesopathy, unspecified: Secondary | ICD-10-CM

## 2013-12-21 DIAGNOSIS — M79673 Pain in unspecified foot: Secondary | ICD-10-CM

## 2013-12-21 MED ORDER — TRIAMCINOLONE ACETONIDE 10 MG/ML IJ SUSP
10.0000 mg | Freq: Once | INTRAMUSCULAR | Status: AC
  Administered 2013-12-21: 10 mg

## 2013-12-21 NOTE — Progress Notes (Signed)
   Subjective:    Patient ID: James Payor., male    DOB: December 26, 1950, 63 y.o.   MRN: [id number]  HPI Comments: "My foot is hurting really bad"  Patient states that he has aching pain in the 5th MPJ right for about 3-4 weeks now. The area is swollen and callused. It is extremely painful when walking. PCP has evaluated and thinks its a wart. No treatment by PCP or patient.       Review of Systems  HENT: Positive for tinnitus.   All other systems reviewed and are negative.       Objective:   Physical Exam        Assessment & Plan:

## 2013-12-21 NOTE — Progress Notes (Signed)
Subjective:     Patient ID: [name]., male   DOB: [birth year], 63 y.o.   MRN: [id number]  Foot Pain   patient states that my right foot is very painful under the fifth metatarsal in my left foot is hurting between the big toe and second toe states that this is been going on for a while and has been worse over the past month   Review of Systems  All other systems reviewed and are negative.       Objective:   Physical Exam  Nursing note and vitals reviewed. Constitutional: He is oriented to person, place, and time.  Cardiovascular: Intact distal pulses.   Musculoskeletal: Normal range of motion.  Neurological: He is oriented to person, place, and time.  Skin: Skin is warm.   neurovascular status intact with no other health history issues and normal range of motion of the subtalar midtarsal joint normal muscle strength and no equinus condition noted of both feet. I noted there to be discomfort with keratotic lesion sub-fifth metatarsal right that is painful when pressed and a keratotic lesion second digit left at the interphalangeal joint of the hallux where there is pressure from the big toe     Assessment:     Plantar flexed fifth metatarsal right with inflammation around the head of the fifth metatarsal and hammertoe deformity second left with interphalangeal joint capsule inflammation of the left second toe    Plan:     H&P and x-ray performed. Injected the right fifth MPJ 3 mg Kenalog dexamethasone combination and the left second toe with 1.5 mg of Kenalog dexamethasone combination and debrided the lesion on the right foot

## 2013-12-28 ENCOUNTER — Ambulatory Visit: Payer: 59 | Admitting: Podiatry

## 2014-01-11 ENCOUNTER — Encounter: Payer: Self-pay | Admitting: Podiatry

## 2014-01-11 DIAGNOSIS — M79609 Pain in unspecified limb: Secondary | ICD-10-CM

## 2014-01-11 DIAGNOSIS — M779 Enthesopathy, unspecified: Secondary | ICD-10-CM

## 2014-01-11 DIAGNOSIS — M2030 Hallux varus (acquired), unspecified foot: Secondary | ICD-10-CM

## 2014-02-03 ENCOUNTER — Encounter: Payer: Self-pay | Admitting: Podiatry

## 2014-08-27 ENCOUNTER — Other Ambulatory Visit: Payer: Self-pay | Admitting: Family Medicine

## 2014-10-12 ENCOUNTER — Other Ambulatory Visit: Payer: 59

## 2014-10-12 ENCOUNTER — Ambulatory Visit (INDEPENDENT_AMBULATORY_CARE_PROVIDER_SITE_OTHER): Payer: 59 | Admitting: Family Medicine

## 2014-10-12 ENCOUNTER — Encounter: Payer: Self-pay | Admitting: Family Medicine

## 2014-10-12 VITALS — BP 110/68 | HR 80 | Temp 99.1°F | Resp 18 | Ht 68.0 in | Wt 201.0 lb

## 2014-10-12 DIAGNOSIS — M7701 Medial epicondylitis, right elbow: Secondary | ICD-10-CM

## 2014-10-12 DIAGNOSIS — B070 Plantar wart: Secondary | ICD-10-CM

## 2014-10-12 DIAGNOSIS — Z79899 Other long term (current) drug therapy: Secondary | ICD-10-CM

## 2014-10-12 DIAGNOSIS — Z125 Encounter for screening for malignant neoplasm of prostate: Secondary | ICD-10-CM

## 2014-10-12 DIAGNOSIS — E785 Hyperlipidemia, unspecified: Secondary | ICD-10-CM

## 2014-10-12 LAB — LIPID PANEL
Cholesterol: 181 mg/dL (ref 0–200)
HDL: 54 mg/dL (ref >39)
LDL Cholesterol: 94 mg/dL (ref 0–99)
Total CHOL/HDL Ratio: 3.4 Ratio
Triglycerides: 167 mg/dL — ABNORMAL HIGH (ref <150)
VLDL: 33 mg/dL (ref 0–40)

## 2014-10-12 LAB — CBC WITH DIFFERENTIAL/PLATELET
BASOS ABS: 0.1 10*3/uL (ref 0.0–0.1)
Basophils Relative: 1 % (ref 0–1)
Eosinophils Absolute: 0.1 10*3/uL (ref 0.0–0.7)
Eosinophils Relative: 2 % (ref 0–5)
HCT: 46.4 % (ref 39.0–52.0)
HEMOGLOBIN: 16 g/dL (ref 13.0–17.0)
LYMPHS PCT: 30 % (ref 12–46)
Lymphs Abs: 2.2 10*3/uL (ref 0.7–4.0)
MCH: 30.4 pg (ref 26.0–34.0)
MCHC: 34.5 g/dL (ref 30.0–36.0)
MCV: 88.2 fL (ref 78.0–100.0)
MPV: 9.6 fL (ref 9.4–12.4)
Monocytes Absolute: 0.7 10*3/uL (ref 0.1–1.0)
Monocytes Relative: 10 % (ref 3–12)
NEUTROS ABS: 4.2 10*3/uL (ref 1.7–7.7)
Neutrophils Relative %: 57 % (ref 43–77)
PLATELETS: 196 10*3/uL (ref 150–400)
RBC: 5.26 MIL/uL (ref 4.22–5.81)
RDW: 13.6 % (ref 11.5–15.5)
WBC: 7.4 10*3/uL (ref 4.0–10.5)

## 2014-10-12 LAB — COMPLETE METABOLIC PANEL WITH GFR
ALBUMIN: 4.3 g/dL (ref 3.5–5.2)
ALT: 18 U/L (ref 0–53)
AST: 15 U/L (ref 0–37)
Alkaline Phosphatase: 60 U/L (ref 39–117)
BUN: 17 mg/dL (ref 6–23)
CALCIUM: 9 mg/dL (ref 8.4–10.5)
CHLORIDE: 105 meq/L (ref 96–112)
CO2: 27 meq/L (ref 19–32)
Creat: 0.97 mg/dL (ref 0.50–1.35)
GFR, EST NON AFRICAN AMERICAN: 84 mL/min
Glucose, Bld: 103 mg/dL — ABNORMAL HIGH (ref 70–99)
POTASSIUM: 5 meq/L (ref 3.5–5.3)
Sodium: 140 mEq/L (ref 135–145)
Total Bilirubin: 0.6 mg/dL (ref 0.2–1.2)
Total Protein: 6.6 g/dL (ref 6.0–8.3)

## 2014-10-12 LAB — TSH: TSH: 2.608 u[IU]/mL (ref 0.350–4.500)

## 2014-10-12 MED ORDER — PRAVASTATIN SODIUM 40 MG PO TABS: 40.0000 mg | ORAL_TABLET | Freq: Every day | ORAL | Status: DC

## 2014-10-12 MED ORDER — METHYLPHENIDATE HCL 20 MG PO TABS: 20.0000 mg | ORAL_TABLET | Freq: Two times a day (BID) | ORAL | Status: DC

## 2014-10-12 NOTE — Progress Notes (Signed)
Subjective:    Patient ID: James Payor., male    DOB: [birth year], 63 y.o.   MRN: [id number]  HPI Patient reports pain over his right medial epicondyles. It has been there for several months. It is recently worsened while he has been building his house. He is tender to palpation just distal to the medial epicondyles particularly on the tendon insertion. Pain is exacerbated by gripping objects, wrist flexion, or using his hand repeatedly. He also has a painful plantar's wart on the plantar surface of his fifth MTP joint. It is approximately 1 cm in diameter. It is thick skin which has lost the dermal ridges with capillary hemorrhages in the center. Past Medical History  Diagnosis Date  . ADD (attention deficit disorder)   . HLD (hyperlipidemia)   . Hypogonadism male   . Nephrolithiasis   . Wears glasses   . Wears partial dentures     upper partial   Past Surgical History  Procedure Laterality Date  . Vasectomy    . Distal biceps tendon repair Left   . Rotator cuff repair Right   . Ercp N/A 03/03/2013    Procedure: ENDOSCOPIC RETROGRADE CHOLANGIOPANCREATOGRAPHY (ERCP);  Surgeon: Arta Silence, MD;  Location[facility] ENDOSCOPY;  Service: Endoscopy;  Laterality: N/A;  prone positioning  . Cholecystectomy N/A 03/04/2013    Procedure: LAPAROSCOPIC CHOLECYSTECTOMY WITH INTRAOPERATIVE CHOLANGIOGRAM;  Surgeon: Gwenyth Ober, MD;  Location: [place];  Service: General;  Laterality: N/A;  . Orif humerus fracture Right 05/16/2013    Procedure: OPEN REDUCTION OF RIGHT HUMERAL HEAD, SUBSCAPULARUS FIXATION AND BIOTENDONESIS REPAIR;  Surgeon: Meredith Pel, MD;  Location: [place];  Service: Orthopedics;  Laterality: Right;  . Knee arthroscopy      2 times  . Finger surgery      left index and middle tendons reattached  . Joint replacement  2012    rt and lt tkr  . Colonoscopy    . Upper gi endoscopy    . Hernia repair  2004    with mesh-rt ing h  . Inguinal hernia repair Right 08/24/2013   Procedure: HERNIA REPAIR INGUINAL ADULT;  Surgeon: Gwenyth Ober, MD;  Location: [place];  Service: General;  Laterality: Right;  right inguinal  . Insertion of mesh Right 08/24/2013    Procedure: INSERTION OF MESH;  Surgeon: Gwenyth Ober, MD;  Location: [place];  Service: General;  Laterality: Right;  right inguinal   No current outpatient prescriptions on file prior to visit.   No current facility-administered medications on file prior to visit.   Allergies  Allergen Reactions  . Codeine Itching  . Morphine And Related Other (See Comments)    "Doesn't work"  . Valium [Diazepam] Other (See Comments)    Pt states "it jacks him up"   History   Social History  . Marital Status: Married    Spouse Name: N/A    Number of Children: N/A  . Years of Education: N/A   Occupational History  . Not on file.   Social History Main Topics  . Smoking status: Never Smoker   . Smokeless tobacco: Never Used  . Alcohol Use: No  . Drug Use: No  . Sexual Activity: Not on file   Other Topics Concern  . Not on file   Social History Narrative      Review of Systems  All other systems reviewed and are negative.      Objective:  Physical Exam  Cardiovascular: Normal rate and regular rhythm.   Pulmonary/Chest: Effort normal and breath sounds normal.  Musculoskeletal:       Right elbow: He exhibits normal range of motion. Tenderness found. Medial epicondyle tenderness noted.  Vitals reviewed.         Assessment & Plan:  Medial epicondylitis, right  Plantar wart of right foot  Using sterile technique, I injected the tendon insertion just distal to the medial epicondyles with a mixture of 0.1% lidocaine and 1 mL of 40 mg per mL Kenalog. The patient tolerated the procedure well without complication. I also recommended that he wear an elbow strap to help reduce the strain on this area with his job. I then turned my attention to the plantar's wart  on the fifth MTP joint. I shaved the PICC hyperkeratotic tissue using a razor blade and then treated the wart itself with cryotherapy using liquid nitrogen for a total of 30 seconds.

## 2014-10-13 LAB — PSA: PSA: 0.93 ng/mL (ref <=4.00)

## 2014-10-14 ENCOUNTER — Encounter: Payer: Self-pay | Admitting: Family Medicine

## 2015-06-10 ENCOUNTER — Ambulatory Visit (INDEPENDENT_AMBULATORY_CARE_PROVIDER_SITE_OTHER): Payer: 59 | Admitting: Family Medicine

## 2015-06-10 ENCOUNTER — Encounter: Payer: Self-pay | Admitting: Family Medicine

## 2015-06-10 VITALS — BP 120/80 | HR 68 | Temp 98.4°F | Resp 18 | Wt 193.0 lb

## 2015-06-10 DIAGNOSIS — M722 Plantar fascial fibromatosis: Secondary | ICD-10-CM | POA: Diagnosis not present

## 2015-06-10 DIAGNOSIS — S63501A Unspecified sprain of right wrist, initial encounter: Secondary | ICD-10-CM

## 2015-06-10 MED ORDER — PREDNISONE 20 MG PO TABS: ORAL_TABLET | ORAL | Status: DC

## 2015-06-10 NOTE — Progress Notes (Signed)
Subjective:    Patient ID: James Payor., male    DOB: [birth year], 64 y.o.   MRN: [id number]  HPI  Patient has been doing his own house. He has been working nonstop 14 and 15 hours a day on his feet. He is developed severe pain in his left heel on the plantar aspect near the insertion of the plantar fascia. He is exquisitely tender to palpation in this spot. It hurts to walk. It hurts to put weight on his foot in the morning. He has a history of plantar fasciitis and this is similar to the pain he had previously with fat. He is also developed significant swelling in his right wrist. There is a slight joint effusion in the left wrist. He denies any trauma. 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 Past Medical History  Diagnosis Date  . ADD (attention deficit disorder)   . HLD (hyperlipidemia)   . Hypogonadism male   . Nephrolithiasis   . Wears glasses   . Wears partial dentures     upper partial   Past Surgical History  Procedure Laterality Date  . Vasectomy    . Distal biceps tendon repair Left   . Rotator cuff repair Right   . Ercp N/A 03/03/2013    Procedure: ENDOSCOPIC RETROGRADE CHOLANGIOPANCREATOGRAPHY (ERCP);  Surgeon: Arta Silence, MD;  Location: [facility] ENDOSCOPY;  Service: Endoscopy;  Laterality: N/A;  prone positioning  . Cholecystectomy N/A 03/04/2013    Procedure: LAPAROSCOPIC CHOLECYSTECTOMY WITH INTRAOPERATIVE CHOLANGIOGRAM;  Surgeon: Gwenyth Ober, MD;  Location: [place];  Service: General;  Laterality: N/A;  . Orif humerus fracture Right 05/16/2013    Procedure: OPEN REDUCTION OF RIGHT HUMERAL HEAD, SUBSCAPULARUS FIXATION AND BIOTENDONESIS REPAIR;  Surgeon: Meredith Pel, MD;  Location: [place];  Service: Orthopedics;  Laterality: Right;  . Knee arthroscopy      2 times  . Finger surgery      left index and middle tendons reattached  . Joint replacement  2012    rt and lt tkr  . Colonoscopy    .  Upper gi endoscopy    . Hernia repair  2004    with mesh-rt ing h  . Inguinal hernia repair Right 08/24/2013    Procedure: HERNIA REPAIR INGUINAL ADULT;  Surgeon: Gwenyth Ober, MD;  Location: [place];  Service: General;  Laterality: Right;  right inguinal  . Insertion of mesh Right 08/24/2013    Procedure: INSERTION OF MESH;  Surgeon: Gwenyth Ober, MD;  Location: [place];  Service: General;  Laterality: Right;  right inguinal   Current Outpatient Prescriptions on File Prior to Visit  Medication Sig Dispense Refill  . methylphenidate (RITALIN) 20 MG tablet Take 1 tablet (20 mg total) by mouth 2 (two) times daily with breakfast and lunch. 60 tablet 0  . pravastatin (PRAVACHOL) 40 MG tablet Take 1 tablet (40 mg total) by mouth daily. 30 tablet 11   No current facility-administered medications on file prior to visit.   Allergies  Allergen Reactions  . Codeine Itching  . Morphine And Related Other (See Comments)    "Doesn't work"  . Valium [Diazepam] Other (See Comments)    Pt states "it jacks him up"   History   Social History  . Marital Status: Married    Spouse Name: N/A  . Number of Children: N/A  . Years of  Education: N/A   Occupational History  . Not on file.   Social History Main Topics  . Smoking status: Never Smoker   . Smokeless tobacco: Never Used  . Alcohol Use: No  . Drug Use: No  . Sexual Activity: Not on file   Other Topics Concern  . Not on file   Social History Narrative     Review of Systems  All other systems reviewed and are negative.      Objective:   Physical Exam  Cardiovascular: Normal rate, regular rhythm and normal heart sounds.   Pulmonary/Chest: Effort normal and breath sounds normal. No respiratory distress. He has no wheezes. He has no rales.  Musculoskeletal:       Right wrist: He exhibits decreased range of motion, tenderness and swelling.       Left foot: There is tenderness and bony  tenderness. There is no swelling.  Vitals reviewed.         Assessment & Plan:  Wrist sprain, right, initial encounter - Plan: predniSONE (DELTASONE) 20 MG tablet  Plantar fasciitis, left  Patient has plantar fasciitis. Using sterile technique, I injected the insertion of the plantar fascia on his left heel with a mixture of 1 mL of lidocaine and 1 mL of 40 mg per mL Kenalog. Patient tolerated procedure well without complication. I believe the patient has an overuse injury/wrist sprain. There is no evidence of de Quervain's tenosynovitis. He is tender to palpation over the first carpal row. He denies any falls or injuries. I recommended rest and ice. I will give the patient prednisone taper pack to calm inflammation.

## 2015-10-15 ENCOUNTER — Other Ambulatory Visit: Payer: Self-pay | Admitting: Family Medicine

## 2016-01-11 ENCOUNTER — Telehealth: Payer: Self-pay | Admitting: Family Medicine

## 2016-01-11 MED ORDER — PRAVASTATIN SODIUM 40 MG PO TABS: ORAL_TABLET | ORAL | Status: DC

## 2016-01-11 NOTE — Telephone Encounter (Signed)
Medication called/sent to requested pharmacy  

## 2016-01-11 NOTE — Telephone Encounter (Signed)
Pt needs a refill of Pravastatin 40 mg sent in to the [facility] on [address]. Pt's # [phone]

## 2017-01-20 ENCOUNTER — Other Ambulatory Visit: Payer: Self-pay | Admitting: Family Medicine

## 2017-03-12 ENCOUNTER — Emergency Department (HOSPITAL_COMMUNITY): Payer: 59

## 2017-03-12 ENCOUNTER — Encounter (HOSPITAL_COMMUNITY): Payer: Self-pay | Admitting: Emergency Medicine

## 2017-03-12 ENCOUNTER — Emergency Department (HOSPITAL_COMMUNITY)
Admission: EM | Admit: 2017-03-12 | Discharge: 2017-03-12 | Disposition: A | Discharge status: Home / Self Care | Payer: 59 | Attending: Emergency Medicine | Admitting: Emergency Medicine

## 2017-03-12 DIAGNOSIS — Z96653 Presence of artificial knee joint, bilateral: Secondary | ICD-10-CM | POA: Diagnosis not present

## 2017-03-12 DIAGNOSIS — F909 Attention-deficit hyperactivity disorder, unspecified type: Secondary | ICD-10-CM | POA: Diagnosis not present

## 2017-03-12 DIAGNOSIS — R110 Nausea: Secondary | ICD-10-CM | POA: Diagnosis not present

## 2017-03-12 DIAGNOSIS — R1031 Right lower quadrant pain: Secondary | ICD-10-CM | POA: Diagnosis present

## 2017-03-12 LAB — CBC
HCT: 50.4 % (ref 39.0–52.0)
Hemoglobin: 17.2 g/dL — ABNORMAL HIGH (ref 13.0–17.0)
MCH: 29.5 pg (ref 26.0–34.0)
MCHC: 34.1 g/dL (ref 30.0–36.0)
MCV: 86.3 fL (ref 78.0–100.0)
PLATELETS: 193 10*3/uL (ref 150–400)
RBC: 5.84 MIL/uL — ABNORMAL HIGH (ref 4.22–5.81)
RDW: 14 % (ref 11.5–15.5)
WBC: 13.5 10*3/uL — ABNORMAL HIGH (ref 4.0–10.5)

## 2017-03-12 LAB — COMPREHENSIVE METABOLIC PANEL
ALT: 34 U/L (ref 17–63)
AST: 30 U/L (ref 15–41)
Albumin: 4 g/dL (ref 3.5–5.0)
Alkaline Phosphatase: 65 U/L (ref 38–126)
Anion gap: 10 (ref 5–15)
BUN: 15 mg/dL (ref 6–20)
CHLORIDE: 105 mmol/L (ref 101–111)
CO2: 22 mmol/L (ref 22–32)
CREATININE: 0.92 mg/dL (ref 0.61–1.24)
Calcium: 9.1 mg/dL (ref 8.9–10.3)
GFR calc Af Amer: >60 mL/min (ref >60)
Glucose, Bld: 119 mg/dL — ABNORMAL HIGH (ref 65–99)
Potassium: 4.1 mmol/L (ref 3.5–5.1)
Sodium: 137 mmol/L (ref 135–145)
Total Bilirubin: 0.8 mg/dL (ref 0.3–1.2)
Total Protein: 6.8 g/dL (ref 6.5–8.1)

## 2017-03-12 LAB — URINALYSIS, ROUTINE W REFLEX MICROSCOPIC
Bilirubin Urine: NEGATIVE
GLUCOSE, UA: NEGATIVE mg/dL
HGB URINE DIPSTICK: NEGATIVE
KETONES UR: NEGATIVE mg/dL
LEUKOCYTES UA: NEGATIVE
Nitrite: NEGATIVE
Protein, ur: NEGATIVE mg/dL
Specific Gravity, Urine: 1.028 (ref 1.005–1.030)
pH: 5 (ref 5.0–8.0)

## 2017-03-12 LAB — LIPASE, BLOOD: Lipase: 26 U/L (ref 11–51)

## 2017-03-12 MED ORDER — IOPAMIDOL (ISOVUE-300) INJECTION 61%
INTRAVENOUS | Status: AC
  Administered 2017-03-12: 100 mL
  Filled 2017-03-12: qty 100

## 2017-03-12 MED ORDER — ONDANSETRON HCL 4 MG/2ML IJ SOLN
4.0000 mg | Freq: Once | INTRAMUSCULAR | Status: AC
  Administered 2017-03-12: 4 mg via INTRAVENOUS
  Filled 2017-03-12: qty 2

## 2017-03-12 MED ORDER — SODIUM CHLORIDE 0.9 % IV BOLUS (SEPSIS)
1000.0000 mL | Freq: Once | INTRAVENOUS | Status: AC
  Administered 2017-03-12: 1000 mL via INTRAVENOUS

## 2017-03-12 MED ORDER — OXYCODONE HCL 5 MG PO TABS: 5.0000 mg | ORAL_TABLET | ORAL | 0 refills | Status: DC | PRN

## 2017-03-12 MED ORDER — ONDANSETRON 4 MG PO TBDP: 4.0000 mg | ORAL_TABLET | Freq: Three times a day (TID) | ORAL | 0 refills | Status: DC | PRN

## 2017-03-12 NOTE — ED Notes (Signed)
ED Provider at bedside. 

## 2017-03-12 NOTE — ED Provider Notes (Signed)
[facility] DEPT Provider Note   CSN: [id number] Arrival date & time: 03/12/17  1714     History   Chief Complaint Chief Complaint  Patient presents with  . Nausea  . Flank Pain    HPI [name]. is a 66 y.o. male.  66 yo M with a chief complaints of right lower quadrant abdominal pain. Going on for the past day. Started yesterday evening. Worse with palpation. Nothing else seems to make it better or worse. Having severe anorexia. Denies fevers denies vomiting.  Had diarrhea this morning.  Has had multiple hernia repairs. Denies other abdominal surgery. Denies radiation of the pain. Sharp and shooting. Constant.   The history is provided by the patient.  Flank Pain  This is a new problem. The current episode started yesterday. The problem occurs constantly. The problem has been gradually worsening. Associated symptoms include abdominal pain. Pertinent negatives include no chest pain, no headaches and no shortness of breath. Nothing aggravates the symptoms. Nothing relieves the symptoms. He has tried nothing for the symptoms. The treatment provided no relief.    Past Medical History:  Diagnosis Date  . ADD (attention deficit disorder)   . HLD (hyperlipidemia)   . Hypogonadism male   . Nephrolithiasis   . Wears glasses   . Wears partial dentures    upper partial    Patient Active Problem List   Diagnosis Date Noted  . Postop check 09/29/2013  . Postoperative hematoma 08/24/2013  . Recurrent right inguinal hernia 08/04/2013  . Cholelithiasis 03/05/2013  . Biliary colic 38/25/0539  . Transaminitis 03/02/2013  . ADD (attention deficit disorder)   . HLD (hyperlipidemia)   . Hypogonadism male   . Nephrolithiasis     Past Surgical History:  Procedure Laterality Date  . CHOLECYSTECTOMY N/A 03/04/2013   Procedure: LAPAROSCOPIC CHOLECYSTECTOMY WITH INTRAOPERATIVE CHOLANGIOGRAM;  Surgeon: Gwenyth Ober, MD;  Location: [place];  Service: General;  Laterality: N/A;    . COLONOSCOPY    . DISTAL BICEPS TENDON REPAIR Left   . ERCP N/A 03/03/2013   Procedure: ENDOSCOPIC RETROGRADE CHOLANGIOPANCREATOGRAPHY (ERCP);  Surgeon: Arta Silence, MD;  Location: [facility] ENDOSCOPY;  Service: Endoscopy;  Laterality: N/A;  prone positioning  . FINGER SURGERY     left index and middle tendons reattached  . HERNIA REPAIR  2004   with mesh-rt ing h  . INGUINAL HERNIA REPAIR Right 08/24/2013   Procedure: HERNIA REPAIR INGUINAL ADULT;  Surgeon: Gwenyth Ober, MD;  Location: [place];  Service: General;  Laterality: Right;  right inguinal  . INSERTION OF MESH Right 08/24/2013   Procedure: INSERTION OF MESH;  Surgeon: Gwenyth Ober, MD;  Location: [place];  Service: General;  Laterality: Right;  right inguinal  . JOINT REPLACEMENT  2012   rt and lt tkr  . KNEE ARTHROSCOPY     2 times  . ORIF HUMERUS FRACTURE Right 05/16/2013   Procedure: OPEN REDUCTION OF RIGHT HUMERAL HEAD, SUBSCAPULARUS FIXATION AND BIOTENDONESIS REPAIR;  Surgeon: Meredith Pel, MD;  Location: [place];  Service: Orthopedics;  Laterality: Right;  . ROTATOR CUFF REPAIR Right   . UPPER GI ENDOSCOPY    . VASECTOMY         Home Medications    Prior to Admission medications   Medication Sig Start Date End Date Taking? Authorizing Provider  methylphenidate (RITALIN) 20 MG tablet Take 1 tablet (20 mg total) by mouth 2 (two) times daily with breakfast and lunch.  10/12/14   Susy Frizzle, MD  pravastatin (PRAVACHOL) 40 MG tablet TAKE 1 TABLET(40 MG) BY MOUTH DAILY 01/21/17   Susy Frizzle, MD  predniSONE (DELTASONE) 20 MG tablet 3 tabs poqday 1-2, 2 tabs poqday 3-4, 1 tab poqday 5-6 06/10/15   Susy Frizzle, MD    Family History Family History  Problem Relation Age of Onset  . Cancer Mother     breast  . Diabetes Father     Social History Social History  Substance Use Topics  . Smoking status: Never Smoker  . Smokeless tobacco: Never Used  . Alcohol use No      Allergies   Codeine; Morphine and related; and Valium [diazepam]   Review of Systems Review of Systems  Constitutional: Negative for chills and fever.  HENT: Negative for congestion and facial swelling.   Eyes: Negative for discharge and visual disturbance.  Respiratory: Negative for shortness of breath.   Cardiovascular: Negative for chest pain and palpitations.  Gastrointestinal: Positive for abdominal pain and nausea. Negative for diarrhea and vomiting.  Genitourinary: Positive for flank pain.  Musculoskeletal: Negative for arthralgias and myalgias.  Skin: Negative for color change and rash.  Neurological: Negative for tremors, syncope and headaches.  Psychiatric/Behavioral: Negative for confusion and dysphoric mood.     Physical Exam Updated Vital Signs BP 127/88   Pulse 98   Temp 98.7 F (37.1 C) (Oral)   Resp 18   SpO2 93%   Physical Exam  Constitutional: He is oriented to person, place, and time. He appears well-developed and well-nourished.  HENT:  Head: Normocephalic and atraumatic.  Eyes: EOM are normal. Pupils are equal, round, and reactive to light.  Neck: Normal range of motion. Neck supple. No JVD present.  Cardiovascular: Normal rate and regular rhythm.  Exam reveals no gallop and no friction rub.   No murmur heard. Pulmonary/Chest: No respiratory distress. He has no wheezes.  Abdominal: He exhibits no distension and no mass. There is tenderness (focal to RLQ, negative psoas, negative obturator). There is no rebound and no guarding.  Musculoskeletal: Normal range of motion.  Neurological: He is alert and oriented to person, place, and time.  Skin: No rash noted. No pallor.  Psychiatric: He has a normal mood and affect. His behavior is normal.  Nursing note and vitals reviewed.    ED Treatments / Results  Labs (all labs ordered are listed, but only abnormal results are displayed) Labs Reviewed  COMPREHENSIVE METABOLIC PANEL - Abnormal; Notable  for the following:       Result Value   Glucose, Bld 119 (*)    All other components within normal limits  CBC - Abnormal; Notable for the following:    WBC 13.5 (*)    RBC 5.84 (*)    Hemoglobin 17.2 (*)    All other components within normal limits  LIPASE, BLOOD  URINALYSIS, ROUTINE W REFLEX MICROSCOPIC    EKG  EKG Interpretation None       Radiology No results found.  Procedures Procedures (including critical care time)  Medications Ordered in ED Medications  sodium chloride 0.9 % bolus 1,000 mL (not administered)  ondansetron (ZOFRAN) injection 4 mg (not administered)     Initial Impression / Assessment and Plan / ED Course  I have reviewed the triage vital signs and the nursing notes.  Pertinent labs & imaging results that were available during my care of the patient were reviewed by me and considered in my medical decision making (  see chart for details).     66 yo M With a chief complaint of right lower quadrant abdominal pain. Concern for acute appendicitis. Patient has a leukocytosis. Afebrile here. Will obtain a CT scan.   CT With no acute findings. Patient refused pain medicine and is resting comfortably on reassessment. Continues to have right lower quadrant abdominal pain. I discussed the findings with the patient. Will treat with pain and nausea medicine. PCP follow-up in a couple days. Strict return precautions.  9:54 PM:  I have discussed the diagnosis/risks/treatment options with the patient and family and believe the pt to be eligible for discharge home to follow-up with PCP. We also discussed returning to the ED immediately if new or worsening sx occur. We discussed the sx which are most concerning (e.g., sudden worsening pain, fever, inability to tolerate by mouth) that necessitate immediate return. Medications administered to the patient during their visit and any new prescriptions provided to the patient are listed below.  Medications given during  this visit Medications  sodium chloride 0.9 % bolus 1,000 mL (0 mLs Intravenous Stopped 03/12/17 2102)  ondansetron (ZOFRAN) injection 4 mg (4 mg Intravenous Given 03/12/17 1944)  iopamidol (ISOVUE-300) 61 % injection (100 mLs  Contrast Given 03/12/17 2020)     The patient appears reasonably screen and/or stabilized for discharge and I doubt any other medical condition or other [facility] requiring further screening, evaluation, or treatment in the ED at this time prior to discharge.    Final Clinical Impressions(s) / ED Diagnoses   Final diagnoses:  None    New Prescriptions New Prescriptions   No medications on file     Deno Etienne, DO 03/12/17 2154

## 2017-03-12 NOTE — ED Notes (Signed)
Pt denies urinary symptoms, reporting that he feels like he has eaten a real big meal, nauseated but denies vomiting. Pt reports he has had some frequent stool this morning. Pt states he has had kidney stones before "but this is different".

## 2017-03-12 NOTE — ED Notes (Signed)
Patient transported to CT 

## 2017-03-12 NOTE — ED Triage Notes (Signed)
Pt sts nausea and RLQ and flank pain starting today

## 2017-03-14 ENCOUNTER — Ambulatory Visit: Payer: 59 | Admitting: Family Medicine

## 2017-04-18 ENCOUNTER — Encounter: Payer: Self-pay | Admitting: Family Medicine

## 2017-04-18 ENCOUNTER — Ambulatory Visit (INDEPENDENT_AMBULATORY_CARE_PROVIDER_SITE_OTHER): Payer: 59 | Admitting: Family Medicine

## 2017-04-18 VITALS — BP 170/110 | HR 82 | Temp 98.1°F | Resp 20 | Wt 195.0 lb

## 2017-04-18 DIAGNOSIS — R109 Unspecified abdominal pain: Secondary | ICD-10-CM

## 2017-04-18 LAB — URINALYSIS, ROUTINE W REFLEX MICROSCOPIC
BILIRUBIN URINE: NEGATIVE
GLUCOSE, UA: NEGATIVE
Hgb urine dipstick: NEGATIVE
Ketones, ur: NEGATIVE
Leukocytes, UA: NEGATIVE
Nitrite: NEGATIVE
PH: 7 (ref 5.0–8.0)
Protein, ur: NEGATIVE
SPECIFIC GRAVITY, URINE: 1.02 (ref 1.001–1.035)

## 2017-04-18 LAB — CBC
HEMATOCRIT: 47.2 % (ref 38.5–50.0)
Hemoglobin: 16.1 g/dL (ref 13.0–17.0)
MCH: 30.1 pg (ref 27.0–33.0)
MCHC: 34.1 g/dL (ref 32.0–36.0)
MCV: 88.2 fL (ref 80.0–100.0)
Platelets: 239 10*3/uL (ref 140–400)
RBC: 5.35 MIL/uL (ref 4.20–5.80)
RDW: 14.4 % (ref 11.0–15.0)
WBC: 8.2 10*3/uL (ref 3.8–10.8)

## 2017-04-18 MED ORDER — KETOROLAC TROMETHAMINE 60 MG/2ML IM SOLN
60.0000 mg | Freq: Once | INTRAMUSCULAR | Status: AC
  Administered 2017-04-18: 60 mg via INTRAMUSCULAR

## 2017-04-18 MED ORDER — HYDROMORPHONE HCL 2 MG PO TABS: 2.0000 mg | ORAL_TABLET | ORAL | 0 refills | Status: DC | PRN

## 2017-04-18 NOTE — Progress Notes (Signed)
Subjective:    Patient ID: James Payor., male    DOB: 1951-01-26, 66 y.o.   MRN: [id number]  HPI Patient is a 66 year old white male who has a history of cholecystectomy complicated by choledocholithiasis requiring an ERCP prior to his cholecystectomy in 2014 as well as a history of a hernia repair who presents with the sudden onset of right upper quadrant abdominal pain earlier this morning. The pain is intense. It is in the right upper quadrant just below the rib. He denies any nausea. He denies any melena. He denies any blood in his stool. He denies any fevers or chills. Similar pain occurred approximately one month ago prompting an emergency room visit with the patient had a transient elevation in white blood cell count of 13. CT scan of the abdomen and pelvis was unremarkable. Pain returned today this morning unprovoked. He states is possible he could've torn a muscle in his abdomen while working on a tractor. However the pain is rated a 10 out of 10 and is unrelieved and his unprovoked by any particular action. It is unrelated to food. Is unrelated to movement. Patient cannot find a comfortable position. On examination today, his abdomen is soft nondistended. He is nontender to palpation. In fact palpation in the right upper quadrant improves the pain slightly. He has diminished bowel sounds but he is able to pass flatus in the exam room. He had a normal bowel movement this morning. Past Medical History:  Diagnosis Date  . ADD (attention deficit disorder)   . HLD (hyperlipidemia)   . Hypogonadism male   . Nephrolithiasis   . Wears glasses   . Wears partial dentures    upper partial   Past Surgical History:  Procedure Laterality Date  . CHOLECYSTECTOMY N/A 03/04/2013   Procedure: LAPAROSCOPIC CHOLECYSTECTOMY WITH INTRAOPERATIVE CHOLANGIOGRAM;  Surgeon: Gwenyth Ober, MD;  Location: [place];  Service: General;  Laterality: N/A;  . COLONOSCOPY    . DISTAL BICEPS TENDON REPAIR Left   .  ERCP N/A 03/03/2013   Procedure: ENDOSCOPIC RETROGRADE CHOLANGIOPANCREATOGRAPHY (ERCP);  Surgeon: Arta Silence, MD;  Location: [facility] ENDOSCOPY;  Service: Endoscopy;  Laterality: N/A;  prone positioning  . FINGER SURGERY     left index and middle tendons reattached  . HERNIA REPAIR  2004   with mesh-rt ing h  . INGUINAL HERNIA REPAIR Right 08/24/2013   Procedure: HERNIA REPAIR INGUINAL ADULT;  Surgeon: Gwenyth Ober, MD;  Location: [place];  Service: General;  Laterality: Right;  right inguinal  . INSERTION OF MESH Right 08/24/2013   Procedure: INSERTION OF MESH;  Surgeon: Gwenyth Ober, MD;  Location: [place];  Service: General;  Laterality: Right;  right inguinal  . JOINT REPLACEMENT  2012   rt and lt tkr  . KNEE ARTHROSCOPY     2 times  . ORIF HUMERUS FRACTURE Right 05/16/2013   Procedure: OPEN REDUCTION OF RIGHT HUMERAL HEAD, SUBSCAPULARUS FIXATION AND BIOTENDONESIS REPAIR;  Surgeon: Meredith Pel, MD;  Location: [place];  Service: Orthopedics;  Laterality: Right;  . ROTATOR CUFF REPAIR Right   . UPPER GI ENDOSCOPY    . VASECTOMY     Current Outpatient Prescriptions on File Prior to Visit  Medication Sig Dispense Refill  . methylphenidate (RITALIN) 20 MG tablet Take 1 tablet (20 mg total) by mouth 2 (two) times daily with breakfast and lunch. 60 tablet 0  . ondansetron (ZOFRAN ODT) 4 MG disintegrating tablet Take 1  tablet (4 mg total) by mouth every 8 (eight) hours as needed for nausea or vomiting. 20 tablet 0  . pravastatin (PRAVACHOL) 40 MG tablet TAKE 1 TABLET(40 MG) BY MOUTH DAILY 30 tablet 5  . predniSONE (DELTASONE) 20 MG tablet 3 tabs poqday 1-2, 2 tabs poqday 3-4, 1 tab poqday 5-6 12 tablet 0   No current facility-administered medications on file prior to visit.    Allergies  Allergen Reactions  . Codeine Itching  . Morphine And Related Other (See Comments)    "Doesn't work"  . Valium [Diazepam] Other (See Comments)    Pt states "it  jacks him up"   Social History   Social History  . Marital status: Married    Spouse name: N/A  . Number of children: N/A  . Years of education: N/A   Occupational History  . Not on file.   Social History Main Topics  . Smoking status: Never Smoker  . Smokeless tobacco: Never Used  . Alcohol use No  . Drug use: No  . Sexual activity: Not on file   Other Topics Concern  . Not on file   Social History Narrative  . No narrative on file      Review of Systems  All other systems reviewed and are negative.      Objective:   Physical Exam  Cardiovascular: Normal rate, regular rhythm and normal heart sounds.   No murmur heard. Pulmonary/Chest: Effort normal and breath sounds normal. No respiratory distress. He has no wheezes. He has no rales.  Abdominal: Soft. Bowel sounds are normal. He exhibits no distension and no mass. There is no tenderness. There is no rebound and no guarding.  Skin:     Vitals reviewed.         Assessment & Plan:  Sudden onset of severe abdominal pain - Plan: Urinalysis, Routine w reflex microscopic, CBC, Lipase, COMPLETE METABOLIC PANEL WITH GFR  Patient received Toradol 60 mg IM 1 in the office. Differential diagnosis includes common bile duct stone, sphincter of Oddi dysfunction, pancreatitis, partial small bowel obstruction secondary to adhesions from previous surgery or abdominal muscle tear. CBC shows no evidence of leukocytosis. Urinalysis was completely normal. There is no hematuria. There is also no bilirubin. The patient does not appear jaundiced. Therefore I see no urgent sign of common bile duct obstruction. CMP will be sent as well as a lipase. Patient's pain improved with Toradol. He has a history of an allergy to codeine and morphine. He has tolerated Dilaudid in the past with a previous knee replacement. Therefore I'll give him Dilaudid 2 mg every 4-6 hours as needed for severe pain. I will schedule the patient to see his  gastroenterologist for evaluation of possible sphincter of Oddi dysfunction first of next week. If the pain becomes severe he knows to go the emergency room immediately.  If GI workup is unremarkable, consider general surgery consultation for evaluations of adhesions. If pain improves before appointment next week, it is also possible this could be a abdominal muscle tear and we can cancel the GI appointment.  Addendum With patient lying in the left lateral decubitus position, pain completely subsided as long as he was still. Upon movement, the pain returned like a cramping spasm in the right upper quadrant. After stretching and standing the pain improved again. Lying recumbent in his vehicle the pain subsided. It seems to be positional and related to movement like a muscle spasm/cramp in the abdominal wall.  Therefore we will  proceed with the plan as stated above

## 2017-04-19 LAB — COMPLETE METABOLIC PANEL WITH GFR
ALBUMIN: 4.4 g/dL (ref 3.6–5.1)
ALK PHOS: 62 U/L (ref 40–115)
ALT: 24 U/L (ref 9–46)
AST: 18 U/L (ref 10–35)
BILIRUBIN TOTAL: 0.7 mg/dL (ref 0.2–1.2)
BUN: 13 mg/dL (ref 7–25)
CALCIUM: 9.1 mg/dL (ref 8.6–10.3)
CO2: 26 mmol/L (ref 20–31)
CREATININE: 1.27 mg/dL — AB (ref 0.70–1.25)
Chloride: 105 mmol/L (ref 98–110)
GFR, EST AFRICAN AMERICAN: 69 mL/min (ref >=60)
GFR, Est Non African American: 59 mL/min — ABNORMAL LOW (ref >=60)
Glucose, Bld: 107 mg/dL — ABNORMAL HIGH (ref 70–99)
Potassium: 4.3 mmol/L (ref 3.5–5.3)
Sodium: 139 mmol/L (ref 135–146)
TOTAL PROTEIN: 6.7 g/dL (ref 6.1–8.1)

## 2017-04-19 LAB — LIPASE: Lipase: 24 U/L (ref 7–60)

## 2017-07-07 ENCOUNTER — Other Ambulatory Visit: Payer: Self-pay | Admitting: Family Medicine

## 2018-01-02 ENCOUNTER — Other Ambulatory Visit: Payer: Self-pay | Admitting: Family Medicine

## 2018-02-04 ENCOUNTER — Ambulatory Visit
Admission: RE | Admit: 2018-02-04 | Discharge: 2018-02-04 | Disposition: A | Discharge status: Home / Self Care | Payer: Medicare HMO | Source: Ambulatory Visit | Attending: Family Medicine | Admitting: Family Medicine

## 2018-02-04 ENCOUNTER — Ambulatory Visit (INDEPENDENT_AMBULATORY_CARE_PROVIDER_SITE_OTHER): Payer: Medicare HMO | Admitting: Family Medicine

## 2018-02-04 ENCOUNTER — Other Ambulatory Visit: Payer: Self-pay | Admitting: Family Medicine

## 2018-02-04 ENCOUNTER — Encounter: Payer: Self-pay | Admitting: Family Medicine

## 2018-02-04 VITALS — BP 148/80 | HR 70 | Temp 98.0°F | Resp 14 | Ht 68.5 in | Wt 202.0 lb

## 2018-02-04 DIAGNOSIS — M545 Low back pain, unspecified: Secondary | ICD-10-CM

## 2018-02-04 DIAGNOSIS — Z125 Encounter for screening for malignant neoplasm of prostate: Secondary | ICD-10-CM | POA: Diagnosis not present

## 2018-02-04 DIAGNOSIS — M898X8 Other specified disorders of bone, other site: Secondary | ICD-10-CM | POA: Diagnosis not present

## 2018-02-04 DIAGNOSIS — Z0000 Encounter for general adult medical examination without abnormal findings: Secondary | ICD-10-CM | POA: Diagnosis not present

## 2018-02-04 DIAGNOSIS — Z23 Encounter for immunization: Secondary | ICD-10-CM | POA: Diagnosis not present

## 2018-02-04 DIAGNOSIS — Z13220 Encounter for screening for lipoid disorders: Secondary | ICD-10-CM | POA: Diagnosis not present

## 2018-02-04 DIAGNOSIS — M4186 Other forms of scoliosis, lumbar region: Secondary | ICD-10-CM | POA: Diagnosis not present

## 2018-02-04 DIAGNOSIS — Z1159 Encounter for screening for other viral diseases: Secondary | ICD-10-CM | POA: Diagnosis not present

## 2018-02-04 DIAGNOSIS — R102 Pelvic and perineal pain: Secondary | ICD-10-CM | POA: Diagnosis not present

## 2018-02-04 DIAGNOSIS — M5136 Other intervertebral disc degeneration, lumbar region: Secondary | ICD-10-CM | POA: Diagnosis not present

## 2018-02-04 LAB — URINALYSIS, ROUTINE W REFLEX MICROSCOPIC
BILIRUBIN URINE: NEGATIVE
Glucose, UA: NEGATIVE
Hgb urine dipstick: NEGATIVE
Ketones, ur: NEGATIVE
LEUKOCYTES UA: NEGATIVE
Nitrite: NEGATIVE
Protein, ur: NEGATIVE
Specific Gravity, Urine: 1.02 (ref 1.001–1.03)
pH: 7 (ref 5.0–8.0)

## 2018-02-04 MED ORDER — METHYLPHENIDATE HCL 20 MG PO TABS: 20.0000 mg | ORAL_TABLET | Freq: Two times a day (BID) | ORAL | 0 refills | Status: DC

## 2018-02-04 NOTE — Addendum Note (Signed)
Addended by: Shary Decamp B on: 02/04/2018 12:38 PM   Modules accepted: Orders

## 2018-02-04 NOTE — Progress Notes (Signed)
Subjective:    Patient ID: James Payor., male    DOB: 1951/04/19, 67 y.o.   MRN: [id number]  HPI Patient is a 67 year old white male here today for a complete physical exam.  However the majority of her visit was spent discussing low back pain.  Patient has had a vague low back pain now for quite some time.  He denies any specific injuries.  Pain is located at approximately the level of L4.  It is located to the right side of the spine.  It is also located just superior to the right iliac crest.  At times he is exquisitely tender to palpation in that area.  It hurts to lie down.  It hurts to sit up.  He denies any relieving factors.  However prolonged standing does not make the pain worse.  He denies any hematuria.  He denies any melena or hematochezia.  He is tender to palpation over the lower lumbar paraspinal muscles as well.  Pain has been going on now for more than 6 weeks prompting his visit.  He is due for Pneumovax 23.  His last colonoscopy was 3-4 years ago per his report.  He was due for repeat colonoscopy after 5 years.  He is due for prostate cancer screening along with regular lab work.  He is also due for hepatitis C screening. Past Medical History:  Diagnosis Date  . ADD (attention deficit disorder)   . HLD (hyperlipidemia)   . Hypogonadism male   . Nephrolithiasis   . Wears glasses   . Wears partial dentures    upper partial   Past Surgical History:  Procedure Laterality Date  . CHOLECYSTECTOMY N/A 03/04/2013   Procedure: LAPAROSCOPIC CHOLECYSTECTOMY WITH INTRAOPERATIVE CHOLANGIOGRAM;  Surgeon: Gwenyth Ober, MD;  Location: [place];  Service: General;  Laterality: N/A;  . COLONOSCOPY    . DISTAL BICEPS TENDON REPAIR Left   . ERCP N/A 03/03/2013   Procedure: ENDOSCOPIC RETROGRADE CHOLANGIOPANCREATOGRAPHY (ERCP);  Surgeon: Arta Silence, MD;  Location: [facility] ENDOSCOPY;  Service: Endoscopy;  Laterality: N/A;  prone positioning  . FINGER SURGERY     left index and middle  tendons reattached  . HERNIA REPAIR  2004   with mesh-rt ing h  . INGUINAL HERNIA REPAIR Right 08/24/2013   Procedure: HERNIA REPAIR INGUINAL ADULT;  Surgeon: Gwenyth Ober, MD;  Location: [place];  Service: General;  Laterality: Right;  right inguinal  . INSERTION OF MESH Right 08/24/2013   Procedure: INSERTION OF MESH;  Surgeon: Gwenyth Ober, MD;  Location: [place];  Service: General;  Laterality: Right;  right inguinal  . JOINT REPLACEMENT  2012   rt and lt tkr  . KNEE ARTHROSCOPY     2 times  . ORIF HUMERUS FRACTURE Right 05/16/2013   Procedure: OPEN REDUCTION OF RIGHT HUMERAL HEAD, SUBSCAPULARUS FIXATION AND BIOTENDONESIS REPAIR;  Surgeon: Meredith Pel, MD;  Location: [place];  Service: Orthopedics;  Laterality: Right;  . ROTATOR CUFF REPAIR Right   . UPPER GI ENDOSCOPY    . VASECTOMY     Current Outpatient Medications on File Prior to Visit  Medication Sig Dispense Refill  . pravastatin (PRAVACHOL) 40 MG tablet TAKE 1 TABLET(40 MG) BY MOUTH DAILY 90 tablet 1   No current facility-administered medications on file prior to visit.    Allergies  Allergen Reactions  . Codeine Itching  . Morphine And Related Other (See Comments)    "Doesn't work"  .  Valium [Diazepam] Other (See Comments)    Pt states "it jacks him up"   Social History   Socioeconomic History  . Marital status: Married    Spouse name: Not on file  . Number of children: Not on file  . Years of education: Not on file  . Highest education level: Not on file  Occupational History  . Not on file  Social Needs  . Financial resource strain: Not on file  . Food insecurity:    Worry: Not on file    Inability: Not on file  . Transportation needs:    Medical: Not on file    Non-medical: Not on file  Tobacco Use  . Smoking status: Never Smoker  . Smokeless tobacco: Never Used  Substance and Sexual Activity  . Alcohol use: No  . Drug use: No  . Sexual activity: Not on  file  Lifestyle  . Physical activity:    Days per week: Not on file    Minutes per session: Not on file  . Stress: Not on file  Relationships  . Social connections:    Talks on phone: Not on file    Gets together: Not on file    Attends religious service: Not on file    Active member of club or organization: Not on file    Attends meetings of clubs or organizations: Not on file    Relationship status: Not on file  . Intimate partner violence:    Fear of current or ex partner: Not on file    Emotionally abused: Not on file    Physically abused: Not on file    Forced sexual activity: Not on file  Other Topics Concern  . Not on file  Social History Narrative  . Not on file   Family History  Problem Relation Age of Onset  . Cancer Mother        breast  . Diabetes Father       Review of Systems  All other systems reviewed and are negative.      Objective:   Physical Exam  Constitutional: He is oriented to person, place, and time. He appears well-developed and well-nourished. No distress.  HENT:  Head: Normocephalic and atraumatic.  Right Ear: External ear normal.  Left Ear: External ear normal.  Nose: Nose normal.  Mouth/Throat: Oropharynx is clear and moist. No oropharyngeal exudate.  Eyes: Pupils are equal, round, and reactive to light. Conjunctivae and EOM are normal. Right eye exhibits no discharge. Left eye exhibits no discharge. No scleral icterus.  Neck: Normal range of motion. Neck supple. No JVD present. No tracheal deviation present. No thyromegaly present.  Cardiovascular: Normal rate, regular rhythm, normal heart sounds and intact distal pulses. Exam reveals no gallop and no friction rub.  No murmur heard. Pulmonary/Chest: Effort normal and breath sounds normal. No stridor. No respiratory distress. He has no wheezes. He has no rales. He exhibits no tenderness.  Abdominal: Soft. Bowel sounds are normal. He exhibits no distension and no mass. There is no  tenderness. There is no rebound and no guarding.  Musculoskeletal: He exhibits tenderness. He exhibits no edema or deformity.       Right hip: He exhibits tenderness and bony tenderness.       Lumbar back: He exhibits decreased range of motion, tenderness, pain and spasm. He exhibits no bony tenderness.       Back:       Legs: Lymphadenopathy:    He has no cervical  adenopathy.  Neurological: He is alert and oriented to person, place, and time. He displays normal reflexes. No cranial nerve deficit. He exhibits normal muscle tone. Coordination normal.  Skin: Skin is warm. No rash noted. He is not diaphoretic. No erythema. No pallor.  Psychiatric: He has a normal mood and affect. His behavior is normal. Judgment and thought content normal.  Vitals reviewed.         Assessment & Plan:  Acute midline low back pain without sciatica - Plan: Urinalysis, Routine w reflex microscopic, DG Lumbar Spine Complete  General medical exam  Prostate cancer screening - Plan: PSA  Screening cholesterol level - Plan: CBC with Differential/Platelet, COMPLETE METABOLIC PANEL WITH GFR, Lipid panel  Encounter for hepatitis C screening test for low risk patient - Plan: Hepatitis C Antibody  Iliac crest bone pain - Plan: DG Pelvis Comp Min 3V  I am concerned by the patient's back pain.  He does not complain of pain often.  He is extremely tender to palpation in the lower right lumbar paraspinal muscles as well as just superior to the right iliac crest.  I will obtain a lumbar spine x-ray along with a pelvic x-ray to evaluate for underlying bony pathology.  Regarding his physical exam, his blood pressure today is elevated.  We will defer that until we have an explanation for his low back pain.  He is due for Pneumovax 23 which he will receive.  Colonoscopy is up-to-date.  I will screen for prostate cancer with a PSA.  Check regular lab work including a CBC, CMP, fasting lipid panel.  Screen for hepatitis C based  on his age.  I also refilled his Ritalin that he uses occasionally for ADHD.  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  He uses the medication while studying for the exam because he is easily distracted.  Patient requires extended time to take the contractor and expect his exam due to his medical disability.  I will draft that note when necessary.  Await the results of his x-rays.

## 2018-02-05 LAB — COMPLETE METABOLIC PANEL WITH GFR
AG RATIO: 1.9 (calc) (ref 1.0–2.5)
ALBUMIN MSPROF: 4.4 g/dL (ref 3.6–5.1)
ALT: 21 U/L (ref 9–46)
AST: 18 U/L (ref 10–35)
Alkaline phosphatase (APISO): 60 U/L (ref 40–115)
BILIRUBIN TOTAL: 0.8 mg/dL (ref 0.2–1.2)
BUN: 15 mg/dL (ref 7–25)
CHLORIDE: 105 mmol/L (ref 98–110)
CO2: 24 mmol/L (ref 20–32)
Calcium: 9.1 mg/dL (ref 8.6–10.3)
Creat: 1.03 mg/dL (ref 0.70–1.25)
GFR, EST AFRICAN AMERICAN: 88 mL/min/{1.73_m2} (ref >=60)
GFR, Est Non African American: 76 mL/min/{1.73_m2} (ref >=60)
GLOBULIN: 2.3 g/dL (ref 1.9–3.7)
Glucose, Bld: 100 mg/dL — ABNORMAL HIGH (ref 65–99)
POTASSIUM: 4.6 mmol/L (ref 3.5–5.3)
SODIUM: 139 mmol/L (ref 135–146)
TOTAL PROTEIN: 6.7 g/dL (ref 6.1–8.1)

## 2018-02-05 LAB — CBC WITH DIFFERENTIAL/PLATELET
BASOS ABS: 78 {cells}/uL (ref 0–200)
BASOS PCT: 1.1 %
EOS ABS: 149 {cells}/uL (ref 15–500)
Eosinophils Relative: 2.1 %
HCT: 48.5 % (ref 38.5–50.0)
HEMOGLOBIN: 16.6 g/dL (ref 13.2–17.1)
Lymphs Abs: 1967 cells/uL (ref 850–3900)
MCH: 29.3 pg (ref 27.0–33.0)
MCHC: 34.2 g/dL (ref 32.0–36.0)
MCV: 85.7 fL (ref 80.0–100.0)
MPV: 10.3 fL (ref 7.5–12.5)
Monocytes Relative: 10.5 %
NEUTROS ABS: 4161 {cells}/uL (ref 1500–7800)
Neutrophils Relative %: 58.6 %
Platelets: 211 10*3/uL (ref 140–400)
RBC: 5.66 10*6/uL (ref 4.20–5.80)
RDW: 12.8 % (ref 11.0–15.0)
Total Lymphocyte: 27.7 %
WBC: 7.1 10*3/uL (ref 3.8–10.8)
WBCMIX: 746 {cells}/uL (ref 200–950)

## 2018-02-05 LAB — HEPATITIS C ANTIBODY
HEP C AB: NONREACTIVE
SIGNAL TO CUT-OFF: 0.01 (ref <1.00)

## 2018-02-05 LAB — LIPID PANEL
Cholesterol: 182 mg/dL (ref <200)
HDL: 57 mg/dL (ref >40)
LDL CHOLESTEROL (CALC): 103 mg/dL — AB
Non-HDL Cholesterol (Calc): 125 mg/dL (calc) (ref <130)
Total CHOL/HDL Ratio: 3.2 (calc) (ref <5.0)
Triglycerides: 128 mg/dL (ref <150)

## 2018-02-05 LAB — PSA: PSA: 1.4 ng/mL (ref <=4.0)

## 2018-04-16 ENCOUNTER — Encounter: Payer: Self-pay | Admitting: Family Medicine

## 2018-05-12 DIAGNOSIS — R69 Illness, unspecified: Secondary | ICD-10-CM | POA: Diagnosis not present

## 2018-08-02 ENCOUNTER — Other Ambulatory Visit: Payer: Self-pay | Admitting: Family Medicine

## 2019-02-16 ENCOUNTER — Other Ambulatory Visit: Payer: Self-pay

## 2019-02-16 MED ORDER — PRAVASTATIN SODIUM 40 MG PO TABS: 40.0000 mg | ORAL_TABLET | Freq: Every day | ORAL | 0 refills | Status: DC

## 2019-03-31 DIAGNOSIS — H5203 Hypermetropia, bilateral: Secondary | ICD-10-CM | POA: Diagnosis not present

## 2019-03-31 DIAGNOSIS — H52203 Unspecified astigmatism, bilateral: Secondary | ICD-10-CM | POA: Diagnosis not present

## 2019-03-31 DIAGNOSIS — H2513 Age-related nuclear cataract, bilateral: Secondary | ICD-10-CM | POA: Diagnosis not present

## 2019-05-14 ENCOUNTER — Other Ambulatory Visit: Payer: Self-pay | Admitting: Family Medicine

## 2019-07-12 DIAGNOSIS — R69 Illness, unspecified: Secondary | ICD-10-CM | POA: Diagnosis not present

## 2019-10-17 IMAGING — CR DG LUMBAR SPINE COMPLETE 4+V
5 series · 5 of 5 positions shown · non-contrast
Comparison: CT 03/12/2017.

CLINICAL DATA: Low back and pelvis pain.  No known injury.

EXAM:
LUMBAR SPINE - COMPLETE 4+ VIEW

[t l-spine a.p.]
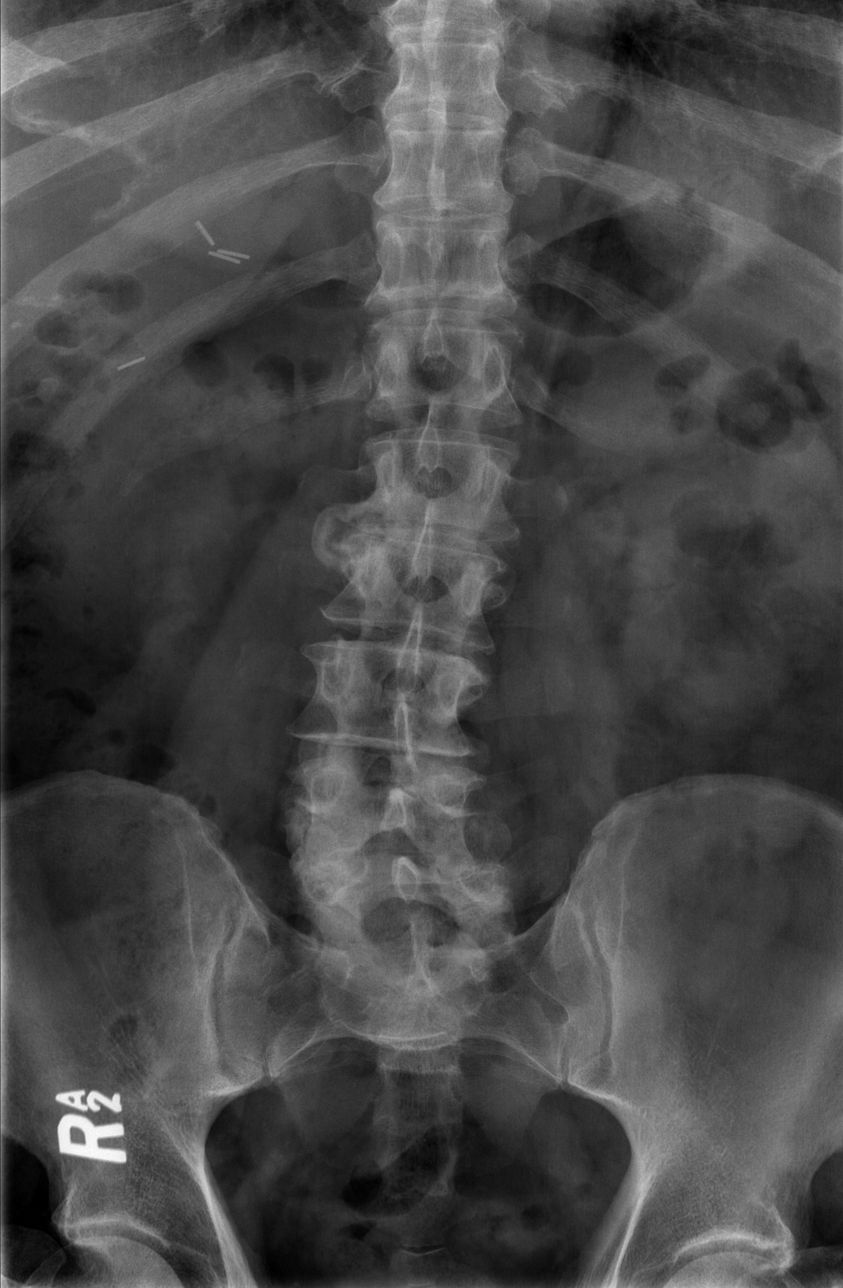

[t l-spine oblique exposure (1 of 2)]
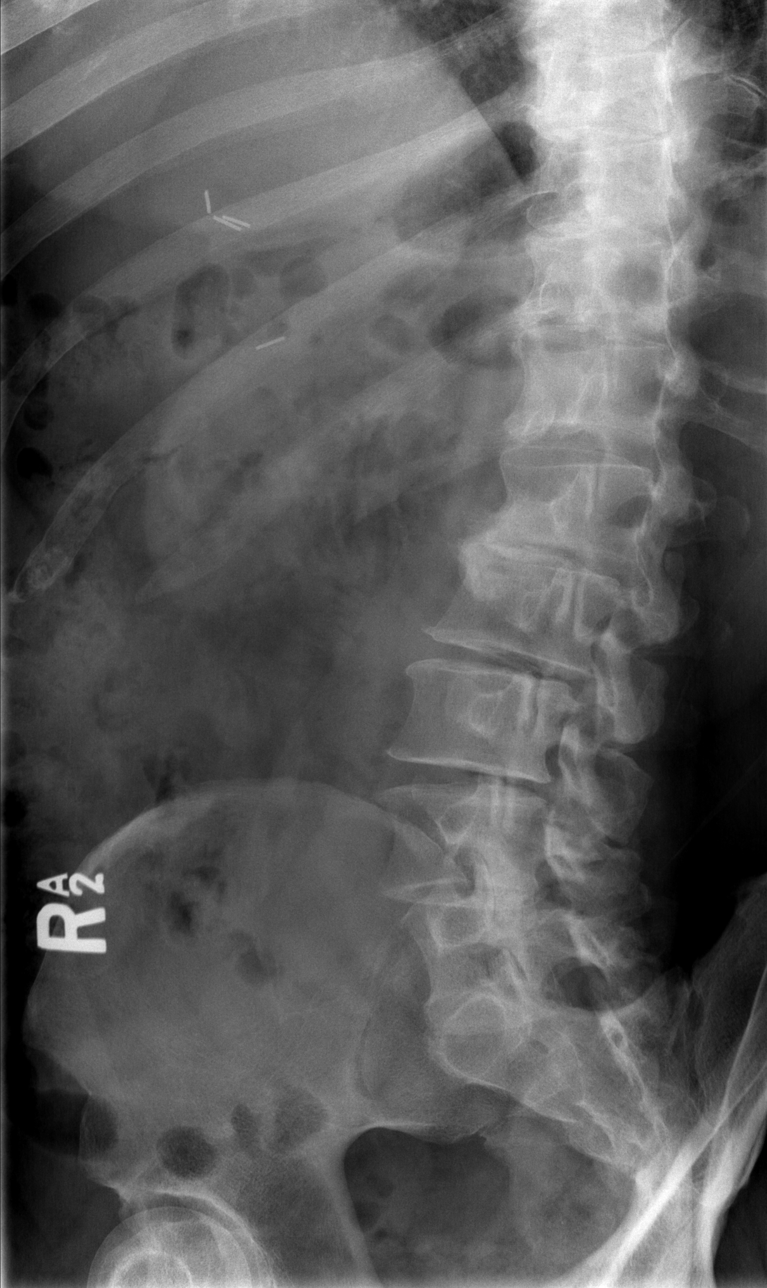

[t l-spine oblique exposure (2 of 2)]
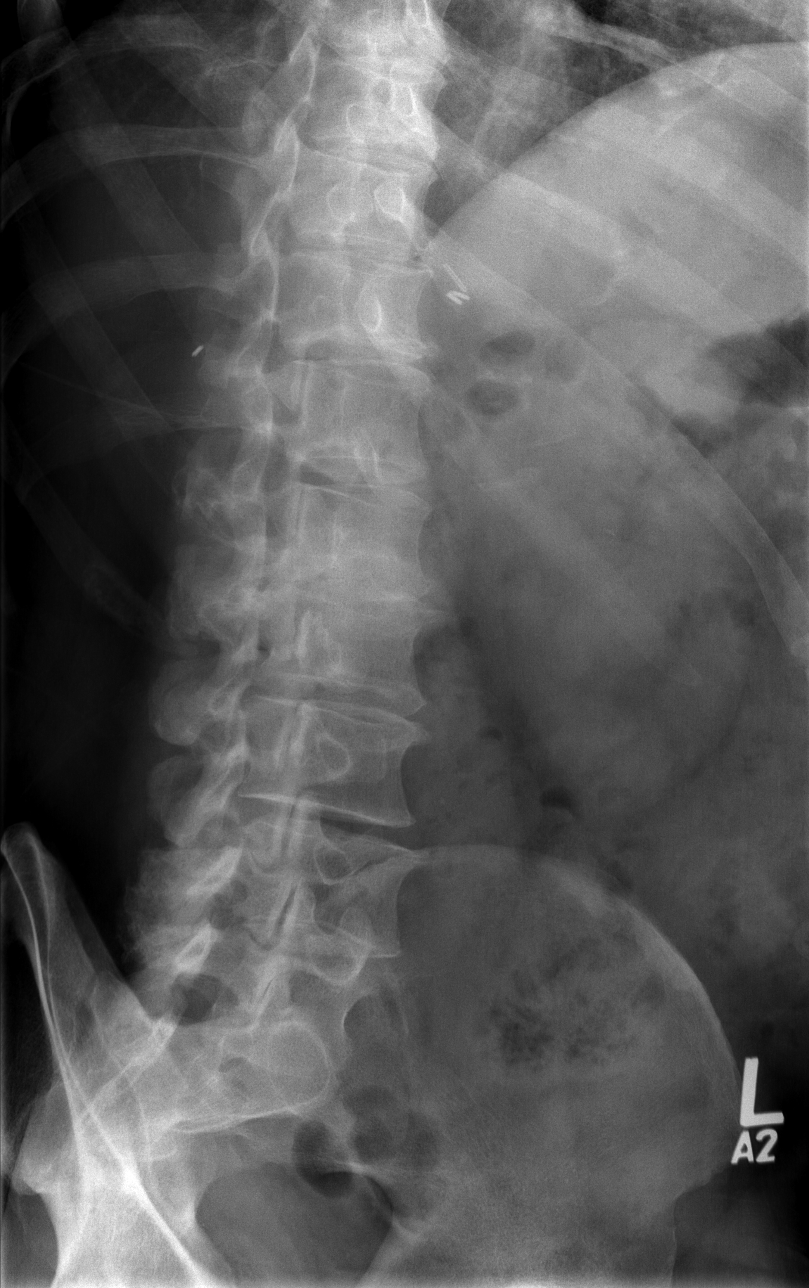

[t l-spine lat]
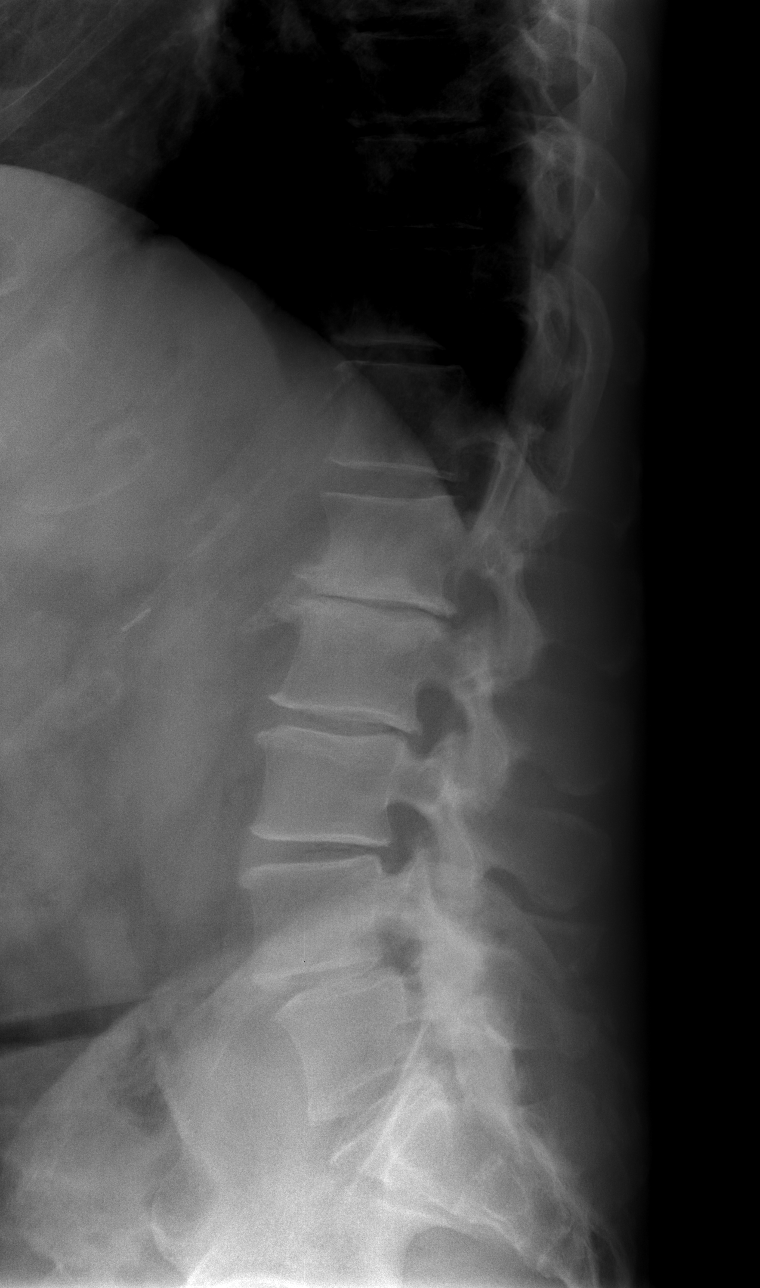

[t l-spine l5-s1 spot]
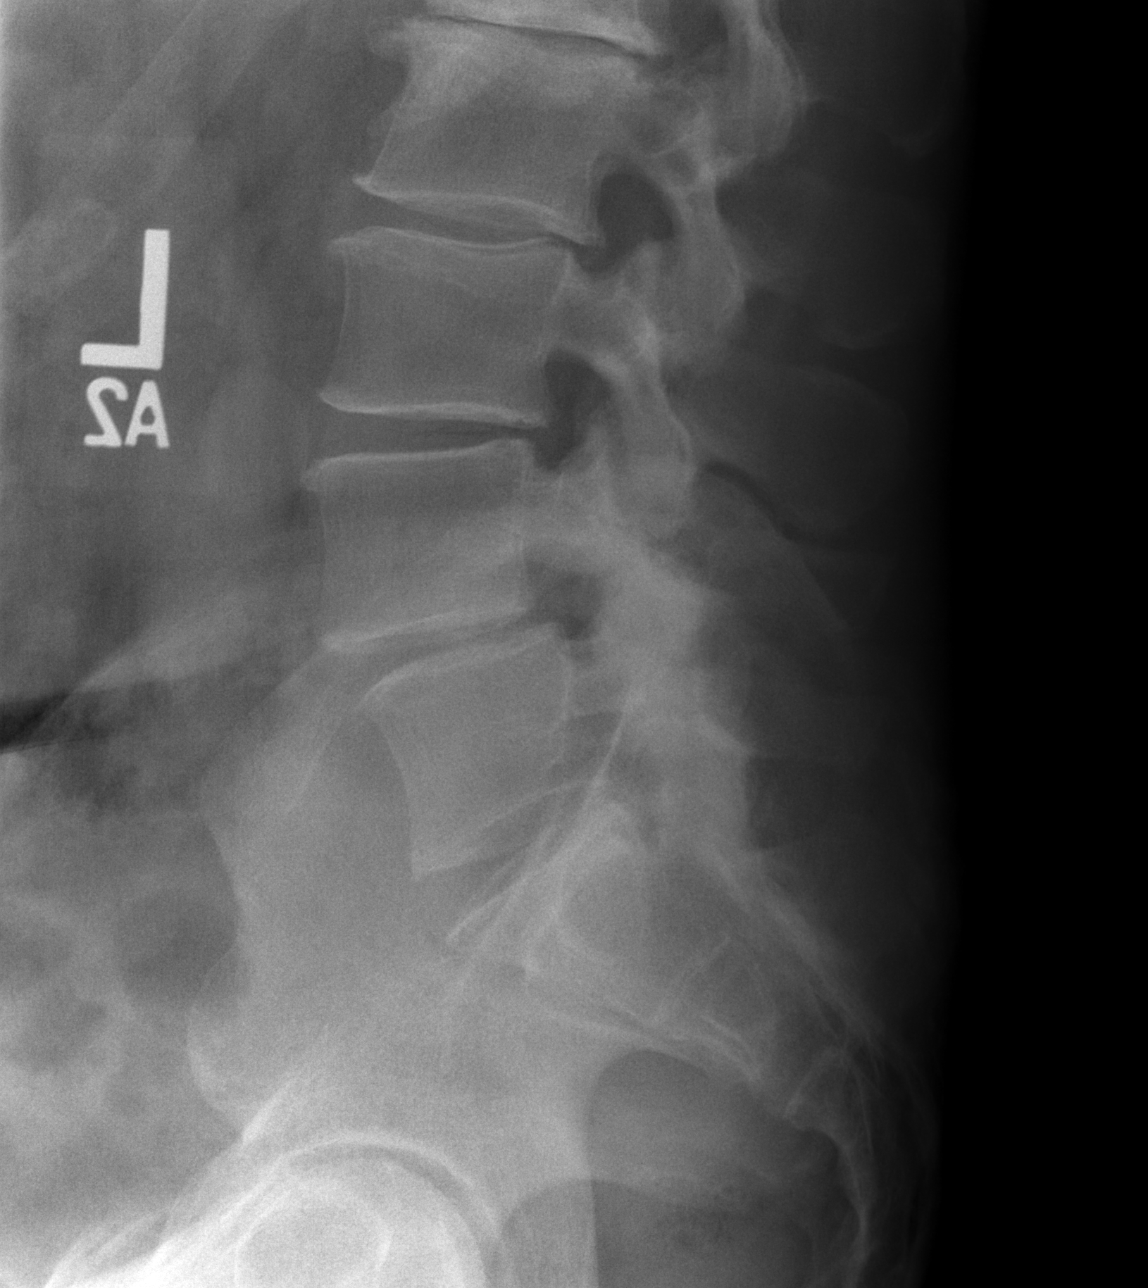

[5 of 5 positions shown; findings below may reference images not displayed]

FINDINGS: Lumbar vertebra numbered with the lowest segmented appearing lumbar
shaped vertebra as L5. Lumbar scoliosis with diffuse degenerative
change. No acute bony abnormality identified. No evidence of
fracture. Stable 4 mm anterolisthesis L4 on L5.
IMPRESSION: Diffuse multilevel degenerative change and scoliosis. Mild 4 mm
anterolisthesis L4 on L5 again noted. Similar findings noted on
prior exam. No acute abnormality identified.

## 2020-01-22 ENCOUNTER — Other Ambulatory Visit: Payer: Self-pay | Admitting: Family Medicine

## 2020-01-22 MED ORDER — AMOXICILLIN 500 MG PO CAPS: 2000.0000 mg | ORAL_CAPSULE | Freq: Once | ORAL | 0 refills | Status: AC

## 2020-05-17 ENCOUNTER — Other Ambulatory Visit: Payer: Self-pay

## 2020-05-17 MED ORDER — PRAVASTATIN SODIUM 40 MG PO TABS: 40.0000 mg | ORAL_TABLET | Freq: Every day | ORAL | 0 refills | Status: DC

## 2020-06-11 ENCOUNTER — Other Ambulatory Visit: Payer: Self-pay | Admitting: Family Medicine

## 2020-06-15 NOTE — Telephone Encounter (Signed)
Last OV 02/04/18 Last refill 05/20/20

## 2020-07-12 ENCOUNTER — Other Ambulatory Visit: Payer: Self-pay | Admitting: Family Medicine

## 2020-07-23 DIAGNOSIS — R69 Illness, unspecified: Secondary | ICD-10-CM | POA: Diagnosis not present

## 2020-08-06 ENCOUNTER — Other Ambulatory Visit: Payer: Self-pay | Admitting: Family Medicine

## 2020-09-01 ENCOUNTER — Other Ambulatory Visit: Payer: Self-pay | Admitting: Family Medicine

## 2020-09-29 ENCOUNTER — Ambulatory Visit: Payer: Medicare HMO

## 2020-09-30 ENCOUNTER — Ambulatory Visit: Payer: Medicare HMO | Attending: Internal Medicine

## 2020-09-30 DIAGNOSIS — Z23 Encounter for immunization: Secondary | ICD-10-CM

## 2020-09-30 NOTE — Progress Notes (Signed)
   [id number]  Name:  [name].    MRN: [id number] DOB: [birth year]  09/30/2020  . [name] was observed post Covid-19 immunization for   Patient didn't come to observation.  Immunizations Administered    No immunizations on file.

## 2020-12-09 ENCOUNTER — Other Ambulatory Visit: Payer: Self-pay | Admitting: Family Medicine

## 2021-05-02 ENCOUNTER — Ambulatory Visit (INDEPENDENT_AMBULATORY_CARE_PROVIDER_SITE_OTHER): Payer: Medicare HMO | Admitting: Family Medicine

## 2021-05-02 ENCOUNTER — Encounter: Payer: Self-pay | Admitting: Family Medicine

## 2021-05-02 ENCOUNTER — Other Ambulatory Visit: Payer: Self-pay

## 2021-05-02 VITALS — BP 146/80 | HR 80 | Temp 97.9°F | Resp 14 | Ht 68.5 in | Wt 203.0 lb

## 2021-05-02 DIAGNOSIS — L820 Inflamed seborrheic keratosis: Secondary | ICD-10-CM | POA: Diagnosis not present

## 2021-05-02 DIAGNOSIS — L819 Disorder of pigmentation, unspecified: Secondary | ICD-10-CM | POA: Diagnosis not present

## 2021-05-02 NOTE — Progress Notes (Signed)
Subjective:    Patient ID: James Payor., male    DOB: Aug 24, 1951, 70 y.o.   MRN: [id number]  HPI There are 2 lesions on his left back that the patient is concerned about.  1 is located on his posterior left shoulder.  Is a large seborrheic keratosis with an erythematous plaque developing medially to it.  The second is a dark mole about 4 mm in diameter on his lower left back.  Please see the photograph below:   Photograph above should be rotated 90 degrees clockwise.  Past Medical History:  Diagnosis Date   ADD (attention deficit disorder)    HLD (hyperlipidemia)    Hypogonadism male    Nephrolithiasis    Wears glasses    Wears partial dentures    upper partial   Past Surgical History:  Procedure Laterality Date   CHOLECYSTECTOMY N/A 03/04/2013   Procedure: LAPAROSCOPIC CHOLECYSTECTOMY WITH INTRAOPERATIVE CHOLANGIOGRAM;  Surgeon: Gwenyth Ober, MD;  Location: [place];  Service: General;  Laterality: N/A;   COLONOSCOPY     DISTAL BICEPS TENDON REPAIR Left    ERCP N/A 03/03/2013   Procedure: ENDOSCOPIC RETROGRADE CHOLANGIOPANCREATOGRAPHY (ERCP);  Surgeon: Arta Silence, MD;  Location: [facility] ENDOSCOPY;  Service: Endoscopy;  Laterality: N/A;  prone positioning   FINGER SURGERY     left index and middle tendons reattached   HERNIA REPAIR  2004   with mesh-rt ing h   INGUINAL HERNIA REPAIR Right 08/24/2013   Procedure: HERNIA REPAIR INGUINAL ADULT;  Surgeon: Gwenyth Ober, MD;  Location: [place];  Service: General;  Laterality: Right;  right inguinal   INSERTION OF MESH Right 08/24/2013   Procedure: INSERTION OF MESH;  Surgeon: Gwenyth Ober, MD;  Location: [place];  Service: General;  Laterality: Right;  right inguinal   JOINT REPLACEMENT  2012   rt and lt tkr   KNEE ARTHROSCOPY     2 times   ORIF HUMERUS FRACTURE Right 05/16/2013   Procedure: OPEN REDUCTION OF RIGHT HUMERAL HEAD, SUBSCAPULARUS FIXATION AND BIOTENDONESIS REPAIR;  Surgeon:  Meredith Pel, MD;  Location: [place];  Service: Orthopedics;  Laterality: Right;   ROTATOR CUFF REPAIR Right    UPPER GI ENDOSCOPY     VASECTOMY     Current Outpatient Medications on File Prior to Visit  Medication Sig Dispense Refill   methylphenidate (RITALIN) 20 MG tablet Take 1 tablet (20 mg total) by mouth 2 (two) times daily with breakfast and lunch. 60 tablet 0   pravastatin (PRAVACHOL) 40 MG tablet TAKE 1 TABLET BY MOUTH EVERY DAY 90 tablet 1   No current facility-administered medications on file prior to visit.   Allergies  Allergen Reactions   Codeine Itching   Morphine And Related Other (See Comments)    "Doesn't work"   Valium [Diazepam] Other (See Comments)    Pt states "it jacks him up"   Social History   Socioeconomic History   Marital status: Married    Spouse name: Not on file   Number of children: Not on file   Years of education: Not on file   Highest education level: Not on file  Occupational History   Not on file  Tobacco Use   Smoking status: Never   Smokeless tobacco: Never  Substance and Sexual Activity   Alcohol use: No   Drug use: No   Sexual activity: Not on file  Other Topics Concern   Not on file  Social  History Narrative   Not on file   Social Determinants of Health   Financial Resource Strain: Not on file  Food Insecurity: Not on file  Transportation Needs: Not on file  Physical Activity: Not on file  Stress: Not on file  Social Connections: Not on file  Intimate Partner Violence: Not on file   Family History  Problem Relation Age of Onset   Cancer Mother        breast   Diabetes Father       Review of Systems  All other systems reviewed and are negative.     Objective:   Physical Exam Vitals reviewed.  Constitutional:      General: He is not in acute distress.    Appearance: He is well-developed. He is not diaphoretic.  HENT:     Head: Normocephalic and atraumatic.  Neck:     Thyroid: No thyromegaly.      Vascular: No JVD.     Trachea: No tracheal deviation.  Cardiovascular:     Rate and Rhythm: Normal rate and regular rhythm.     Heart sounds: Normal heart sounds. No murmur heard.   No friction rub. No gallop.  Pulmonary:     Effort: Pulmonary effort is normal. No respiratory distress.     Breath sounds: Normal breath sounds. No stridor. No wheezing or rales.  Chest:     Chest wall: No tenderness.  Musculoskeletal:       Back:  Skin:    General: Skin is warm.     Coloration: Skin is not pale.     Findings: Lesion present. No erythema or rash.  Neurological:     Mental Status: He is alert.     Motor: No abnormal muscle tone.          Assessment & Plan:  Change in multiple pigmented skin lesions I anesthetized both lesions with 0.1% lidocaine with epinephrine and performed a shave biopsy of both lesions.  Each lesion was sent separately to pathology and labeled containers.  The superior lesion was labeled left shoulder.  The pigmented mole lesion was labeled left lower back.  Await the results of the biopsy.

## 2021-05-04 LAB — TISSUE PATH REPORT

## 2021-05-04 LAB — PATHOLOGY REPORT

## 2021-06-02 ENCOUNTER — Other Ambulatory Visit: Payer: Self-pay | Admitting: Family Medicine

## 2021-07-06 ENCOUNTER — Other Ambulatory Visit: Payer: Self-pay | Admitting: Family Medicine

## 2021-07-06 MED ORDER — PRAVASTATIN SODIUM 40 MG PO TABS: 40.0000 mg | ORAL_TABLET | Freq: Every day | ORAL | 3 refills | Status: DC

## 2021-12-05 ENCOUNTER — Telehealth: Payer: Self-pay | Admitting: Family Medicine

## 2021-12-05 NOTE — Telephone Encounter (Signed)
I called patient to schedule his AWV.  Patient was very upset. Patient wanted me to send a message to the manager. Patient said the last time he was in the office there were only 3 people in the office and Dr.Pickard was doing most of the work himself.  He said he won't be back until more people are hired.

## 2022-03-13 DIAGNOSIS — H5200 Hypermetropia, unspecified eye: Secondary | ICD-10-CM | POA: Diagnosis not present

## 2022-07-04 ENCOUNTER — Other Ambulatory Visit: Payer: Self-pay

## 2022-07-04 MED ORDER — PRAVASTATIN SODIUM 40 MG PO TABS: 40.0000 mg | ORAL_TABLET | Freq: Every day | ORAL | 0 refills | Status: DC

## 2022-07-04 NOTE — Telephone Encounter (Signed)
Courtesy refill. appt scheduled for 07/17/22.  Requested Prescriptions  Pending Prescriptions Disp Refills  . pravastatin (PRAVACHOL) 40 MG tablet 90 tablet 0    Sig: Take 1 tablet (40 mg total) by mouth daily.     Cardiovascular:  Antilipid - Statins Failed - 07/04/2022 10:09 AM      Failed - Valid encounter within last 12 months    Recent Outpatient Visits          1 year ago Change in multiple pigmented skin lesions   [place] Susy Frizzle, MD   4 years ago Acute midline low back pain without sciatica   [place] Susy Frizzle, MD   5 years ago Sudden onset of severe abdominal pain   [place] Pickard, Cammie Mcgee, MD   7 years ago Wrist sprain, right, initial encounter   [place] Pickard, Cammie Mcgee, MD   7 years ago Medial epicondylitis, right   [place], Cammie Mcgee, MD      Future Appointments            In 1 week Dennard Schaumann, Cammie Mcgee, MD [place]           Failed - Lipid Panel in normal range within the last 12 months    Cholesterol  Date Value Ref Range Status  02/04/2018 182 <200 mg/dL Final   LDL Cholesterol (Calc)  Date Value Ref Range Status  02/04/2018 103 (H) mg/dL (calc) Final    Comment:    Reference range: <100 . Desirable range <100 mg/dL for primary prevention;   <70 mg/dL for patients with CHD or diabetic patients  with > or = 2 CHD risk factors. Marland Kitchen LDL-C is now calculated using the Martin-Hopkins  calculation, which is a validated novel method providing  better accuracy than the Friedewald equation in the  estimation of LDL-C.  Cresenciano Genre et al. Annamaria Helling. 9528;413(24): 2061-2068  (http://education.[facility].com/faq/FAQ164)    HDL  Date Value Ref Range Status  02/04/2018 57 >40 mg/dL Final   Triglycerides  Date Value Ref Range Status  02/04/2018 128 <150 mg/dL Final         Passed - Patient is not pregnant

## 2022-07-04 NOTE — Telephone Encounter (Signed)
Pharmacy faxed a refill request for pravastatin (PRAVACHOL) 40 MG tablet [[id number]]    Order Details Dose: 40 mg Route: Oral Frequency: Daily  Dispense Quantity: 90 tablet Refills: 3        Sig: Take 1 tablet (40 mg total) by mouth daily.       Start Date: 07/06/21 End Date: --  Written Date: 07/06/21 Expiration Date: 07/06/22

## 2022-07-17 ENCOUNTER — Ambulatory Visit (INDEPENDENT_AMBULATORY_CARE_PROVIDER_SITE_OTHER): Payer: Medicare HMO | Admitting: Family Medicine

## 2022-07-17 VITALS — BP 116/62 | HR 88 | Temp 97.2°F | Ht 68.5 in | Wt 201.8 lb

## 2022-07-17 DIAGNOSIS — G8929 Other chronic pain: Secondary | ICD-10-CM

## 2022-07-17 DIAGNOSIS — Z1211 Encounter for screening for malignant neoplasm of colon: Secondary | ICD-10-CM

## 2022-07-17 DIAGNOSIS — M25512 Pain in left shoulder: Secondary | ICD-10-CM

## 2022-07-17 DIAGNOSIS — Z0000 Encounter for general adult medical examination without abnormal findings: Secondary | ICD-10-CM | POA: Diagnosis not present

## 2022-07-17 DIAGNOSIS — E7800 Pure hypercholesterolemia, unspecified: Secondary | ICD-10-CM | POA: Diagnosis not present

## 2022-07-17 DIAGNOSIS — Z125 Encounter for screening for malignant neoplasm of prostate: Secondary | ICD-10-CM | POA: Diagnosis not present

## 2022-07-17 LAB — CBC WITH DIFFERENTIAL/PLATELET
Eosinophils Relative: 2.4 %
Hemoglobin: 15.4 g/dL (ref 13.2–17.1)
Monocytes Relative: 11.3 %
Neutro Abs: 6686 cells/uL (ref 1500–7800)
Total Lymphocyte: 19.4 %
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
## 2022-07-18 LAB — CBC WITH DIFFERENTIAL/PLATELET
Absolute Monocytes: 1141 cells/uL — ABNORMAL HIGH (ref 200–950)
Basophils Absolute: 71 cells/uL (ref 0–200)
Basophils Relative: 0.7 %
Eosinophils Absolute: 242 cells/uL (ref 15–500)
HCT: 45.7 % (ref 38.5–50.0)
Lymphs Abs: 1959 cells/uL (ref 850–3900)
MCH: 30.1 pg (ref 27.0–33.0)
MCHC: 33.7 g/dL (ref 32.0–36.0)
MCV: 89.3 fL (ref 80.0–100.0)
MPV: 10 fL (ref 7.5–12.5)
Neutrophils Relative %: 66.2 %
Platelets: 224 10*3/uL (ref 140–400)
RBC: 5.12 10*6/uL (ref 4.20–5.80)
RDW: 12.8 % (ref 11.0–15.0)
WBC: 10.1 10*3/uL (ref 3.8–10.8)

## 2022-07-18 LAB — LIPID PANEL
Cholesterol: 136 mg/dL (ref <200)
HDL: 50 mg/dL (ref >=40)
LDL Cholesterol (Calc): 60 mg/dL (calc)
Non-HDL Cholesterol (Calc): 86 mg/dL (calc) (ref <130)
Total CHOL/HDL Ratio: 2.7 (calc) (ref <5.0)
Triglycerides: 185 mg/dL — ABNORMAL HIGH (ref <150)

## 2022-07-18 LAB — COMPLETE METABOLIC PANEL WITH GFR
AG Ratio: 1.8 (calc) (ref 1.0–2.5)
ALT: 23 U/L (ref 9–46)
AST: 18 U/L (ref 10–35)
Albumin: 4.3 g/dL (ref 3.6–5.1)
Alkaline phosphatase (APISO): 61 U/L (ref 35–144)
BUN: 15 mg/dL (ref 7–25)
CO2: 24 mmol/L (ref 20–32)
Calcium: 9.2 mg/dL (ref 8.6–10.3)
Chloride: 106 mmol/L (ref 98–110)
Creat: 1.02 mg/dL (ref 0.70–1.35)
Globulin: 2.4 g/dL (calc) (ref 1.9–3.7)
Glucose, Bld: 82 mg/dL (ref 65–99)
Potassium: 4.2 mmol/L (ref 3.5–5.3)
Sodium: 141 mmol/L (ref 135–146)
Total Bilirubin: 0.6 mg/dL (ref 0.2–1.2)
Total Protein: 6.7 g/dL (ref 6.1–8.1)
eGFR: 80 mL/min/{1.73_m2} (ref >=60)

## 2022-07-18 LAB — PSA: PSA: 2.77 ng/mL (ref <=4.00)

## 2022-07-23 DIAGNOSIS — Z1211 Encounter for screening for malignant neoplasm of colon: Secondary | ICD-10-CM | POA: Diagnosis not present

## 2022-07-28 LAB — COLOGUARD: COLOGUARD: NEGATIVE

## 2022-10-03 ENCOUNTER — Other Ambulatory Visit: Payer: Self-pay | Admitting: Family Medicine

## 2023-01-01 ENCOUNTER — Other Ambulatory Visit: Payer: Self-pay | Admitting: Family Medicine

## 2023-03-18 ENCOUNTER — Ambulatory Visit (INDEPENDENT_AMBULATORY_CARE_PROVIDER_SITE_OTHER): Payer: Medicare HMO | Admitting: Family Medicine

## 2023-03-18 VITALS — HR 86

## 2023-03-18 DIAGNOSIS — S61210A Laceration without foreign body of right index finger without damage to nail, initial encounter: Secondary | ICD-10-CM | POA: Diagnosis not present

## 2023-03-18 NOTE — Progress Notes (Signed)
Subjective:    Patient ID: James Anon., male    DOB: 09-Apr-1951, 72 y.o.   MRN: [id number]  HPI  Patient sustained a laceration this morning to his right hand the finger.  Laceration is approximately 2 inches long.  The finger is neurovascularly intact.  He has full sensation all over the finger.  He has excellent blood flow to the fingertip with normal capillary refill.  He is able to flex and extend the finger fully against resistance.  Therefore there is no apparent damage to the extensor tendon sheath. Past Medical History:  Diagnosis Date   ADD (attention deficit disorder)    HLD (hyperlipidemia)    Hypogonadism male    Nephrolithiasis    Wears glasses    Wears partial dentures    upper partial   Past Surgical History:  Procedure Laterality Date   CHOLECYSTECTOMY N/A 03/04/2013   Procedure: LAPAROSCOPIC CHOLECYSTECTOMY WITH INTRAOPERATIVE CHOLANGIOGRAM;  Surgeon: Cherylynn Ridges, MD;  [facility];  Service: General;  Laterality: N/A;   COLONOSCOPY     DISTAL BICEPS TENDON REPAIR Left    ERCP N/A 03/03/2013   Procedure: ENDOSCOPIC RETROGRADE CHOLANGIOPANCREATOGRAPHY (ERCP);  Surgeon: Willis Modena, MD;  Location: [facility] ENDOSCOPY;  Service: Endoscopy;  Laterality: N/A;  prone positioning   FINGER SURGERY     left index and middle tendons reattached   HERNIA REPAIR  2004   with mesh-rt ing h   INGUINAL HERNIA REPAIR Right 08/24/2013   Procedure: HERNIA REPAIR INGUINAL ADULT;  Surgeon: Cherylynn Ridges, MD;  Location: [facility];  Service: General;  Laterality: Right;  right inguinal   INSERTION OF MESH Right 08/24/2013   Procedure: INSERTION OF MESH;  Surgeon: Cherylynn Ridges, MD;  Location: [facility];  Service: General;  Laterality: Right;  right inguinal   JOINT REPLACEMENT  2012   rt and lt tkr   KNEE ARTHROSCOPY     2 times   ORIF HUMERUS FRACTURE Right 05/16/2013   Procedure: OPEN REDUCTION OF RIGHT HUMERAL HEAD, SUBSCAPULARUS FIXATION AND  BIOTENDONESIS REPAIR;  Surgeon: Cammy Copa, MD;  Location: [facility];  Service: Orthopedics;  Laterality: Right;   ROTATOR CUFF REPAIR Right    UPPER GI ENDOSCOPY     VASECTOMY     Current Outpatient Medications on File Prior to Visit  Medication Sig Dispense Refill   methylphenidate (RITALIN) 20 MG tablet Take 1 tablet (20 mg total) by mouth 2 (two) times daily with breakfast and lunch. 60 tablet 0   pravastatin (PRAVACHOL) 40 MG tablet TAKE 1 TABLET BY MOUTH EVERY DAY 90 tablet 0   No current facility-administered medications on file prior to visit.   Allergies  Allergen Reactions   Codeine Itching   Morphine And Related Other (See Comments)    "Doesn't work"   Valium [Diazepam] Other (See Comments)    Pt states "it jacks him up"   Social History   Socioeconomic History   Marital status: Married    Spouse name: Not on file   Number of children: Not on file   Years of education: Not on file   Highest education level: Not on file  Occupational History   Not on file  Tobacco Use   Smoking status: Never   Smokeless tobacco: Never  Substance and Sexual Activity   Alcohol use: No   Drug use: No   Sexual activity: Not on file  Other Topics Concern   Not on file  Social History Narrative   Not on file   Social Determinants of Health   Financial Resource Strain: Not on file  Food Insecurity: Not on file  Transportation Needs: Not on file  Physical Activity: Not on file  Stress: Not on file  Social Connections: Not on file  Intimate Partner Violence: Not on file     Review of Systems  All other systems reviewed and are negative.      Objective:   Physical Exam Vitals reviewed.  Cardiovascular:     Rate and Rhythm: Normal rate and regular rhythm.     Heart sounds: Normal heart sounds.  Pulmonary:     Effort: Pulmonary effort is normal.     Breath sounds: Normal breath sounds.  Musculoskeletal:     Right hand: Laceration and tenderness present. No bony  tenderness. Normal range of motion. Normal strength. Normal sensation. There is no disruption of two-point discrimination. Normal capillary refill.       Hands:           Assessment & Plan:   Laceration of right index finger without foreign body without damage to nail, initial encounter Finger was anesthetized using 0.1% lidocaine without epinephrine-.  The wound was then cleaned thoroughly with over 500 cc of normal saline and then scrubbed using Betadine with brush.  The wound edges were then approximated with 7 simple interrupted 4-0 Ethilon sutures.  Minimal blood loss.  Wound care was discussed.  Recommended Keflex 500 mg 3 times a day for 7 days given the proximity to the interphalangeal joint.  Stitches out in 10 days.

## 2023-03-27 DIAGNOSIS — Z135 Encounter for screening for eye and ear disorders: Secondary | ICD-10-CM | POA: Diagnosis not present

## 2023-03-27 DIAGNOSIS — H5203 Hypermetropia, bilateral: Secondary | ICD-10-CM | POA: Diagnosis not present

## 2023-03-27 DIAGNOSIS — H52223 Regular astigmatism, bilateral: Secondary | ICD-10-CM | POA: Diagnosis not present

## 2023-03-27 DIAGNOSIS — H524 Presbyopia: Secondary | ICD-10-CM | POA: Diagnosis not present

## 2023-03-27 DIAGNOSIS — H259 Unspecified age-related cataract: Secondary | ICD-10-CM | POA: Diagnosis not present

## 2023-03-29 ENCOUNTER — Other Ambulatory Visit: Payer: Self-pay | Admitting: Family Medicine

## 2023-06-22 ENCOUNTER — Other Ambulatory Visit: Payer: Self-pay | Admitting: Family Medicine

## 2023-06-24 NOTE — Telephone Encounter (Signed)
Requested Prescriptions  Pending Prescriptions Disp Refills   pravastatin (PRAVACHOL) 40 MG tablet [Pharmacy Med Name: PRAVASTATIN SODIUM 40 MG TAB] 90 tablet 0    Sig: TAKE 1 TABLET BY MOUTH EVERY DAY     Cardiovascular:  Antilipid - Statins Failed - 06/22/2023  8:48 AM      Failed - Valid encounter within last 12 months    Recent Outpatient Visits           2 years ago Change in multiple pigmented skin lesions   [facility] Donita Brooks, MD   5 years ago Acute midline low back pain without sciatica   [facility] Donita Brooks, MD   6 years ago Sudden onset of severe abdominal pain   [facility] Pickard, Priscille Heidelberg, MD   8 years ago Wrist sprain, right, initial encounter   [facility] Pickard, Priscille Heidelberg, MD   8 years ago Medial epicondylitis, right   [facility] Pickard, Priscille Heidelberg, MD              Failed - Lipid Panel in normal range within the last 12 months    Cholesterol  Date Value Ref Range Status  07/17/2022 136 <200 mg/dL Final   LDL Cholesterol (Calc)  Date Value Ref Range Status  07/17/2022 60 mg/dL (calc) Final    Comment:    Reference range: <100 . Desirable range <100 mg/dL for primary prevention;   <70 mg/dL for patients with CHD or diabetic patients  with > or = 2 CHD risk factors. Marland Kitchen LDL-C is now calculated using the Martin-Hopkins  calculation, which is a validated novel method providing  better accuracy than the Friedewald equation in the  estimation of LDL-C.  Horald Pollen et al. Lenox Ahr. 4098;119(14): 2061-2068  (http://education.[facility].com/faq/FAQ164)    HDL  Date Value Ref Range Status  07/17/2022 50 > OR = 40 mg/dL Final   Triglycerides  Date Value Ref Range Status  07/17/2022 185 (H) <150 mg/dL Final         Passed - Patient is not pregnant

## 2023-07-16 ENCOUNTER — Other Ambulatory Visit: Payer: Self-pay | Admitting: Family Medicine

## 2023-07-16 MED ORDER — AMOXICILLIN 875 MG PO TABS: 875.0000 mg | ORAL_TABLET | Freq: Two times a day (BID) | ORAL | 0 refills | Status: AC

## 2023-07-29 ENCOUNTER — Other Ambulatory Visit: Payer: Self-pay | Admitting: Family Medicine

## 2023-07-29 MED ORDER — NEOMYCIN-POLYMYXIN-HC 3.5-10000-1 OT SOLN: 3.0000 [drp] | Freq: Four times a day (QID) | OTIC | 1 refills | Status: DC

## 2023-09-19 ENCOUNTER — Other Ambulatory Visit: Payer: Self-pay | Admitting: Family Medicine

## 2023-09-19 NOTE — Telephone Encounter (Signed)
Requested medication (s) are due for refill today: Due 09/24/23  Requested medication (s) are on the active medication list: yes    Last refill: 06/24/23  #90  0  refills  Future visit scheduled no  Notes to clinic:Failed due to labs, please review. Thank you  Requested Prescriptions  Pending Prescriptions Disp Refills   pravastatin (PRAVACHOL) 40 MG tablet [Pharmacy Med Name: PRAVASTATIN SODIUM 40 MG TAB] 90 tablet 0    Sig: TAKE 1 TABLET BY MOUTH EVERY DAY     Cardiovascular:  Antilipid - Statins Failed - 09/19/2023  1:32 AM      Failed - Valid encounter within last 12 months    Recent Outpatient Visits           2 years ago Change in multiple pigmented skin lesions   [facility] Donita Brooks, MD   5 years ago Acute midline low back pain without sciatica   [facility] Donita Brooks, MD   6 years ago Sudden onset of severe abdominal pain   [facility] Pickard, Priscille Heidelberg, MD   8 years ago Wrist sprain, right, initial encounter   [facility] Pickard, Priscille Heidelberg, MD   8 years ago Medial epicondylitis, right   [facility] Pickard, Priscille Heidelberg, MD              Failed - Lipid Panel in normal range within the last 12 months    Cholesterol  Date Value Ref Range Status  07/17/2022 136 <200 mg/dL Final   LDL Cholesterol (Calc)  Date Value Ref Range Status  07/17/2022 60 mg/dL (calc) Final    Comment:    Reference range: <100 . Desirable range <100 mg/dL for primary prevention;   <70 mg/dL for patients with CHD or diabetic patients  with > or = 2 CHD risk factors. Marland Kitchen LDL-C is now calculated using the Martin-Hopkins  calculation, which is a validated novel method providing  better accuracy than the Friedewald equation in the  estimation of LDL-C.  Horald Pollen et al. Lenox Ahr. 1610;960(45): 2061-2068  (http://education.[facility].com/faq/FAQ164)    HDL  Date Value Ref Range Status   07/17/2022 50 > OR = 40 mg/dL Final   Triglycerides  Date Value Ref Range Status  07/17/2022 185 (H) <150 mg/dL Final         Passed - Patient is not pregnant

## 2023-09-26 ENCOUNTER — Other Ambulatory Visit: Payer: Self-pay | Admitting: Family Medicine

## 2023-09-26 ENCOUNTER — Other Ambulatory Visit: Payer: Self-pay

## 2023-09-26 ENCOUNTER — Telehealth: Payer: Self-pay | Admitting: Family Medicine

## 2023-09-26 DIAGNOSIS — E7800 Pure hypercholesterolemia, unspecified: Secondary | ICD-10-CM

## 2023-09-26 MED ORDER — PRAVASTATIN SODIUM 40 MG PO TABS: 40.0000 mg | ORAL_TABLET | Freq: Every day | ORAL | 3 refills | Status: DC

## 2023-09-26 MED ORDER — PRAVASTATIN SODIUM 40 MG PO TABS: 40.0000 mg | ORAL_TABLET | Freq: Every day | ORAL | 0 refills | Status: DC

## 2023-09-26 NOTE — Telephone Encounter (Signed)
Patient left message with [facility] to permanently move all refills to [facility].  Patient needs a refill of:  pravastatin (PRAVACHOL) 40 MG tablet [[id number]]   Pharmacy:    Please advise patient at [phone].

## 2024-05-25 ENCOUNTER — Ambulatory Visit: Payer: Self-pay | Admitting: *Deleted

## 2024-05-25 ENCOUNTER — Emergency Department (HOSPITAL_BASED_OUTPATIENT_CLINIC_OR_DEPARTMENT_OTHER): Admitting: Radiology

## 2024-05-25 ENCOUNTER — Other Ambulatory Visit: Payer: Self-pay

## 2024-05-25 ENCOUNTER — Inpatient Hospital Stay (HOSPITAL_BASED_OUTPATIENT_CLINIC_OR_DEPARTMENT_OTHER)
Admission: EM | Admit: 2024-05-25 | Discharge: 2024-06-02 | DRG: 234 | Disposition: A | Discharge status: Home / Self Care | Attending: Thoracic Surgery (Cardiothoracic Vascular Surgery) | Admitting: Thoracic Surgery (Cardiothoracic Vascular Surgery)

## 2024-05-25 ENCOUNTER — Encounter (HOSPITAL_BASED_OUTPATIENT_CLINIC_OR_DEPARTMENT_OTHER): Payer: Self-pay

## 2024-05-25 DIAGNOSIS — D696 Thrombocytopenia, unspecified: Secondary | ICD-10-CM | POA: Diagnosis not present

## 2024-05-25 DIAGNOSIS — I214 Non-ST elevation (NSTEMI) myocardial infarction: Secondary | ICD-10-CM | POA: Diagnosis not present

## 2024-05-25 DIAGNOSIS — R0609 Other forms of dyspnea: Secondary | ICD-10-CM | POA: Diagnosis not present

## 2024-05-25 DIAGNOSIS — E663 Overweight: Secondary | ICD-10-CM | POA: Diagnosis present

## 2024-05-25 DIAGNOSIS — I10 Essential (primary) hypertension: Secondary | ICD-10-CM | POA: Diagnosis present

## 2024-05-25 DIAGNOSIS — Z6829 Body mass index (BMI) 29.0-29.9, adult: Secondary | ICD-10-CM

## 2024-05-25 DIAGNOSIS — I2510 Atherosclerotic heart disease of native coronary artery without angina pectoris: Secondary | ICD-10-CM | POA: Diagnosis present

## 2024-05-25 DIAGNOSIS — R079 Chest pain, unspecified: Secondary | ICD-10-CM | POA: Diagnosis present

## 2024-05-25 DIAGNOSIS — D72829 Elevated white blood cell count, unspecified: Secondary | ICD-10-CM | POA: Diagnosis not present

## 2024-05-25 DIAGNOSIS — E785 Hyperlipidemia, unspecified: Secondary | ICD-10-CM | POA: Diagnosis present

## 2024-05-25 DIAGNOSIS — R030 Elevated blood-pressure reading, without diagnosis of hypertension: Secondary | ICD-10-CM | POA: Diagnosis present

## 2024-05-25 DIAGNOSIS — R5383 Other fatigue: Secondary | ICD-10-CM | POA: Diagnosis not present

## 2024-05-25 DIAGNOSIS — D62 Acute posthemorrhagic anemia: Secondary | ICD-10-CM | POA: Diagnosis not present

## 2024-05-25 DIAGNOSIS — F988 Other specified behavioral and emotional disorders with onset usually occurring in childhood and adolescence: Secondary | ICD-10-CM | POA: Diagnosis present

## 2024-05-25 DIAGNOSIS — Z79899 Other long term (current) drug therapy: Secondary | ICD-10-CM

## 2024-05-25 DIAGNOSIS — Z951 Presence of aortocoronary bypass graft: Secondary | ICD-10-CM

## 2024-05-25 LAB — BASIC METABOLIC PANEL WITH GFR
Anion gap: 12 (ref 5–15)
BUN: 12 mg/dL (ref 8–23)
CO2: 22 mmol/L (ref 22–32)
Calcium: 9.1 mg/dL (ref 8.9–10.3)
Chloride: 103 mmol/L (ref 98–111)
Creatinine, Ser: 0.89 mg/dL (ref 0.61–1.24)
GFR, Estimated: >60 mL/min (ref >60)
Glucose, Bld: 91 mg/dL (ref 70–99)
Potassium: 4.3 mmol/L (ref 3.5–5.1)
Sodium: 137 mmol/L (ref 135–145)

## 2024-05-25 LAB — TROPONIN T, HIGH SENSITIVITY
Troponin T High Sensitivity: 53 ng/L — ABNORMAL HIGH (ref <19)
Troponin T High Sensitivity: 54 ng/L — ABNORMAL HIGH (ref <19)
Troponin T High Sensitivity: 54 ng/L — ABNORMAL HIGH (ref <19)
Troponin T High Sensitivity: 60 ng/L — ABNORMAL HIGH (ref <19)

## 2024-05-25 LAB — CBC
HCT: 47.8 % (ref 39.0–52.0)
Hemoglobin: 16.1 g/dL (ref 13.0–17.0)
MCH: 29.6 pg (ref 26.0–34.0)
MCHC: 33.7 g/dL (ref 30.0–36.0)
MCV: 87.9 fL (ref 80.0–100.0)
Platelets: 213 K/uL (ref 150–400)
RBC: 5.44 MIL/uL (ref 4.22–5.81)
RDW: 13.9 % (ref 11.5–15.5)
WBC: 9.5 K/uL (ref 4.0–10.5)
nRBC: 0 % (ref 0.0–0.2)

## 2024-05-25 LAB — HEPARIN LEVEL (UNFRACTIONATED): Heparin Unfractionated: 0.37 [IU]/mL (ref 0.30–0.70)

## 2024-05-25 MED ORDER — HEPARIN (PORCINE) 25000 UT/250ML-% IV SOLN
1200.0000 [IU]/h | INTRAVENOUS | Status: DC
  Administered 2024-05-25 – 2024-05-26 (×2): 1100 [IU]/h via INTRAVENOUS
  Filled 2024-05-25 (×3): qty 250

## 2024-05-25 MED ORDER — HEPARIN BOLUS VIA INFUSION
4000.0000 [IU] | Freq: Once | INTRAVENOUS | Status: AC
  Administered 2024-05-25: 4000 [IU] via INTRAVENOUS

## 2024-05-25 NOTE — ED Notes (Signed)
 Patient reported increased chest pain. Provider made aware, repeat EKG and troponin ordered by provider.

## 2024-05-25 NOTE — Telephone Encounter (Signed)
 Pt is at [facility] ED.

## 2024-05-25 NOTE — ED Provider Notes (Signed)
 [place] EMERGENCY DEPARTMENT AT [facility] Provider Note   CSN: [id number] Arrival date & time: 05/25/24  1120     Patient presents with: Chest Pain   [name]. is a 73 y.o. male with past medical history significant for hyperlipidemia, no previous history of hypertension, diabetes, previous CAD, ACS, tobacco use, was a former IT sales professional and endorses a history of a lot of smoke exposure however.  Comes in today reporting heart does not feel right.  He denies any chest pain but endorses some tightness.  He reports increased fatigue and dyspnea on exertion.  Denies any nausea, vomiting.  No pleuritic chest pain, no history of blood clots, no recent travel or surgery.    Chest Pain      Prior to Admission medications   Medication Sig Start Date End Date Taking? Authorizing Provider  methylphenidate  (RITALIN ) 20 MG tablet Take 1 tablet (20 mg total) by mouth 2 (two) times daily with breakfast and lunch. 02/04/18   Duanne Butler DASEN, MD  neomycin -polymyxin-hydrocortisone (CORTISPORIN) OTIC solution Place 3 drops into both ears 4 (four) times daily. 07/29/23   Duanne Butler DASEN, MD  pravastatin  (PRAVACHOL ) 40 MG tablet Take 1 tablet (40 mg total) by mouth daily. 09/26/23   Duanne Butler DASEN, MD    Allergies: Codeine, Morphine  and codeine, and Valium [diazepam]    Review of Systems  Cardiovascular:  Positive for chest pain.  All other systems reviewed and are negative.   Updated Vital Signs BP (!) 126/91   Pulse 71   Temp 98.7 F (37.1 C) (Oral)   Resp 16   Ht 5' 8.5 (1.74 m)   Wt 91 kg   SpO2 95%   BMI 30.06 kg/m   Physical Exam Vitals and nursing note reviewed.  Constitutional:      General: He is not in acute distress.    Appearance: Normal appearance.  HENT:     Head: Normocephalic and atraumatic.  Eyes:     General:        Right eye: No discharge.        Left eye: No discharge.  Cardiovascular:     Rate and Rhythm: Normal rate and  regular rhythm.     Heart sounds: No murmur heard.    No friction rub. No gallop.  Pulmonary:     Effort: Pulmonary effort is normal.     Breath sounds: Normal breath sounds.     Comments: No wheezing, rhonchi, stridor, rales Abdominal:     General: Bowel sounds are normal.     Palpations: Abdomen is soft.  Skin:    General: Skin is warm and dry.     Capillary Refill: Capillary refill takes less than 2 seconds.  Neurological:     Mental Status: He is alert and oriented to person, place, and time.  Psychiatric:        Mood and Affect: Mood normal.        Behavior: Behavior normal.     (all labs ordered are listed, but only abnormal results are displayed) Labs Reviewed  TROPONIN T, HIGH SENSITIVITY - Abnormal; Notable for the following components:      Result Value   Troponin T High Sensitivity 53 (*)    All other components within normal limits  CBC  BASIC METABOLIC PANEL WITH GFR  TROPONIN T, HIGH SENSITIVITY    EKG: EKG Interpretation Date/Time:  Monday May 25 2024 11:32:37 EDT Ventricular Rate:  88 PR Interval:  170 QRS  Duration:  80 QT Interval:  350 QTC Calculation: 423 R Axis:   32  Text Interpretation: Normal sinus rhythm Confirmed by Franklyn Gills [phone]) on 05/25/2024 12:44:23 PM  Radiology: [facility] Chest 2 View Result Date: 05/25/2024 CLINICAL DATA:  Chest pain.  Fatigue.  Dyspnea on exertion. EXAM: CHEST - 2 VIEW COMPARISON:  08/12/2008 FINDINGS: Cardiomediastinal silhouette and pulmonary vasculature are within normal limits. Lungs are clear. Advanced degenerative changes of the RIGHT glenohumeral joint. Surgical anchor seen in the RIGHT humeral head. IMPRESSION: No acute cardiopulmonary process. Electronically Signed   By: Aliene Lloyd M.D.   On: 05/25/2024 12:23     Procedures   Medications Ordered in the ED - No data to display                                  Medical Decision Making Amount and/or Complexity of Data Reviewed Radiology:  ordered.   This patient is a 72 y.o. male  who presents to the ED for concern of chest tightness, fatigue, dyspnea on exertion.   Differential diagnoses prior to evaluation: The emergent differential diagnosis includes, but is not limited to,  ACS, AAS, PE, Mallory-Weiss, Boerhaave's, Pneumonia, acute bronchitis, asthma or COPD exacerbation, anxiety, MSK pain or traumatic injury to the chest, acid reflux versus other . This is not an exhaustive differential.   Past Medical History / Co-morbidities / Social History: HLD, ADD, nephrolithiasis  Additional history: Chart reviewed. Pertinent results include: No previous cardiac cath, echo  Physical Exam: Physical exam performed. The pertinent findings include: Some hypertension on arrival, blood pressure 142/97, vital signs otherwise stable, blood pressure improved without intervention to 137/85 on recheck.  Lab Tests/Imaging studies: I personally interpreted labs/imaging and the pertinent results include: CBC unremarkable, BMP unremarkable, his initial troponin is elevated at 53 with several days of similar symptoms, we will obtain a delta troponin but findings concerning for NSTEMI at this time, will plan to contact cardiology.  I independently interpreted plain film chest x-ray which shows no evidence of acute intrathoracic abnormality. I agree with the radiologist interpretation.  Cardiac monitoring: EKG obtained and interpreted by myself and attending physician which shows: Normal sinus rhythm, no acute ST-T changes  Consults: I spoke with the cardiologist, Dr. Jeffrie who agrees to consult for NSTEMI, requests hospital admission, spoke with the hospitalist, Dr. Augustin who agrees to admission for NSTEMI   Disposition: After consideration of the diagnostic results and the patients response to treatment, I feel that patient would benefit from admission as discussed above .     Final diagnoses:  NSTEMI (non-ST elevated myocardial  infarction) [facility])    ED Discharge Orders     None          Rosan Sherlean DEL, PA-C 05/25/24 1319    Franklyn Gills SAILOR, MD 05/25/24 1349

## 2024-05-25 NOTE — ED Triage Notes (Signed)
 States heart doesn't feel right denies CP. States increased fatigue and dyspnea on exertion.

## 2024-05-25 NOTE — Telephone Encounter (Signed)
 Copied from CRM [id number]. Topic: Clinical - Red Word Triage >> May 25, 2024 10:36 AM Winona R wrote: Decision tree denial,  new symptom of chest pain, pt ask wife for Asprin. It has not happened today however pt wife does state it is new and has happened a few times. Reason for Disposition  [1] Chest pain (or angina) comes and goes AND [2] is happening more often (increasing in frequency) or getting worse (increasing in severity)  (Exception: Chest pains that last only a few seconds.)  Answer Assessment - Initial Assessment Questions 1. LOCATION: Where does it hurt?       Wife, Dedra calling in.    He is having chest pain.      Twice he has asked me for aspirin  in the last couple of weeks.   This is not like him at all. Husband said in the background Tell them to never mind.   I'm not going to the ED.     He is more tired than normal per [place].   He is being hard headed.   I think he needs to go to the ED and maybe hearing it from someone other than me would help.      He did agree to go to the ED at the [facility].   I let them know that would be fine.      2. RADIATION: Does the pain go anywhere else? (e.g., into neck, jaw, arms, back)     He did not mention it going anywhere else.   Had to talk him into going to the ED. 3. ONSET: When did the chest pain begin? (Minutes, hours or days)      2 weeks ago it's been on and off.   4. PATTERN: Does the pain come and go, or has it been constant since it started?  Does it get worse with exertion?      Intermittent 5. DURATION: How long does it last (e.g., seconds, minutes, hours)     Not asked due to trying to get him to agree to go to the ED. 6. SEVERITY: How bad is the pain?  (e.g., Scale 1-10; mild, moderate, or severe)     Not asked.    Per Dedra he has been asking me for aspirin  twice in the last couple of weeks and that is very out of character for him. 7. CARDIAC RISK FACTORS: Do you have any  history of heart problems or risk factors for heart disease? (e.g., angina, prior heart attack; diabetes, high blood pressure, high cholesterol, smoker, or strong family history of heart disease)     No 8. PULMONARY RISK FACTORS: Do you have any history of lung disease?  (e.g., blood clots in lung, asthma, emphysema, birth control pills)     Not asked 9. CAUSE: What do you think is causing the chest pain?     Not asked 10. OTHER SYMPTOMS: Do you have any other symptoms? (e.g., dizziness, nausea, vomiting, sweating, fever, difficulty breathing, cough)       More tired than usual.   Denies shortness of breath. 11. PREGNANCY: Is there any chance you are pregnant? When was your last menstrual period?       N/A  Protocols used: Chest Pain-A-AH FYI Only or Action Required?: FYI only for provider.  Patient was last seen in primary care on 03/18/2023 by Duanne Butler DASEN, MD.  Called Nurse Triage reporting Chest Pain.  Symptoms began several weeks ago.  Interventions attempted: OTC medications: taking aspirin  for the chest pain.  Symptoms are: gradually worsening. Having chest pain intermittently for the last 2 weeks.    More tire than usual per Dedra, his wife.   Triage Disposition: Go to ED Now (Notify PCP)  Patient/caregiver understands and will follow disposition?: Yes

## 2024-05-25 NOTE — Progress Notes (Signed)
 PHARMACY - ANTICOAGULATION CONSULT NOTE  Pharmacy Consult for heparin  Indication: chest pain/ACS  Allergies  Allergen Reactions   Codeine Itching   Morphine  And Codeine Other (See Comments)    Doesn't work   Valium [Diazepam] Other (See Comments)    Pt states it jacks him up    Patient Measurements: Height: 5' 8.5 (174 cm) Weight: 91 kg (200 lb 9.9 oz) IBW/kg (Calculated) : 69.55 HEPARIN  DW (KG): 88.2  Vital Signs: Temp: 98.7 F (37.1 C) (07/14 1205) Temp Source: Oral (07/14 1205) BP: 126/91 (07/14 1315) Pulse Rate: 71 (07/14 1315)  Labs: Recent Labs    05/25/24 1207  HGB 16.1  HCT 47.8  PLT 213  CREATININE 0.89    Estimated Creatinine Clearance: 84.2 mL/min (by C-G formula based on SCr of 0.89 mg/dL).   Medical History: Past Medical History:  Diagnosis Date   ADD (attention deficit disorder)    HLD (hyperlipidemia)    Hypogonadism male    Nephrolithiasis    Wears glasses    Wears partial dentures    upper partial    Medications:  Infusions:   heparin       Assessment: 15 yof presented to the ED with CP. To start IV heparin . Baseline CBC is WNL and he is not on anticoagulation PTA.   Goal of Therapy:  Heparin  level 0.3-0.7 units/ml Monitor platelets by anticoagulation protocol: Yes   Plan:  Heparin  bolus 4000 units IV x 1 Heparin  gtt 1100 units/hr Check an 8 hr heparin  level Daily heparin  level and CBC  Tim Wilhide, Vernell Helling 05/25/2024,1:32 PM

## 2024-05-26 ENCOUNTER — Observation Stay (HOSPITAL_BASED_OUTPATIENT_CLINIC_OR_DEPARTMENT_OTHER)

## 2024-05-26 DIAGNOSIS — R079 Chest pain, unspecified: Secondary | ICD-10-CM

## 2024-05-26 DIAGNOSIS — I200 Unstable angina: Secondary | ICD-10-CM

## 2024-05-26 DIAGNOSIS — I214 Non-ST elevation (NSTEMI) myocardial infarction: Secondary | ICD-10-CM | POA: Diagnosis not present

## 2024-05-26 DIAGNOSIS — E785 Hyperlipidemia, unspecified: Secondary | ICD-10-CM

## 2024-05-26 DIAGNOSIS — R030 Elevated blood-pressure reading, without diagnosis of hypertension: Secondary | ICD-10-CM | POA: Diagnosis not present

## 2024-05-26 DIAGNOSIS — E663 Overweight: Secondary | ICD-10-CM | POA: Diagnosis not present

## 2024-05-26 LAB — CBC
HCT: 48.6 % (ref 39.0–52.0)
Hemoglobin: 16.4 g/dL (ref 13.0–17.0)
MCH: 29.5 pg (ref 26.0–34.0)
MCHC: 33.7 g/dL (ref 30.0–36.0)
MCV: 87.4 fL (ref 80.0–100.0)
Platelets: 196 K/uL (ref 150–400)
RBC: 5.56 MIL/uL (ref 4.22–5.81)
RDW: 13.7 % (ref 11.5–15.5)
WBC: 8.3 K/uL (ref 4.0–10.5)
nRBC: 0 % (ref 0.0–0.2)

## 2024-05-26 LAB — LIPID PANEL
Cholesterol: 183 mg/dL (ref 0–200)
HDL: 49 mg/dL (ref >40)
LDL Cholesterol: 100 mg/dL — ABNORMAL HIGH (ref 0–99)
Total CHOL/HDL Ratio: 3.7 ratio
Triglycerides: 169 mg/dL — ABNORMAL HIGH (ref <150)
VLDL: 34 mg/dL (ref 0–40)

## 2024-05-26 LAB — ECHOCARDIOGRAM COMPLETE
Area-P 1/2: 3.91 cm2
Height: 69 in
S' Lateral: 2.6 cm
Weight: 3146.41 [oz_av]

## 2024-05-26 LAB — HEPARIN LEVEL (UNFRACTIONATED): Heparin Unfractionated: 0.34 [IU]/mL (ref 0.30–0.70)

## 2024-05-26 LAB — TSH: TSH: 2.797 u[IU]/mL (ref 0.350–4.500)

## 2024-05-26 MED ORDER — ACETAMINOPHEN 325 MG PO TABS
650.0000 mg | ORAL_TABLET | ORAL | Status: DC | PRN
Start: 2024-05-26 — End: 2024-05-29
  Filled 2024-05-26: qty 2

## 2024-05-26 MED ORDER — ATORVASTATIN CALCIUM 80 MG PO TABS
80.0000 mg | ORAL_TABLET | Freq: Every day | ORAL | Status: DC
  Administered 2024-05-28 – 2024-06-02 (×5): 80 mg via ORAL
  Filled 2024-05-26 (×6): qty 1

## 2024-05-26 MED ORDER — SODIUM CHLORIDE 0.9 % WEIGHT BASED INFUSION: 1.0000 mL/kg/h | INTRAVENOUS | Status: DC

## 2024-05-26 MED ORDER — ASPIRIN 81 MG PO TBEC
81.0000 mg | DELAYED_RELEASE_TABLET | Freq: Every day | ORAL | Status: DC
  Administered 2024-05-26 – 2024-05-28 (×2): 81 mg via ORAL
  Filled 2024-05-26 (×2): qty 1

## 2024-05-26 MED ORDER — ASPIRIN 81 MG PO CHEW
81.0000 mg | CHEWABLE_TABLET | ORAL | Status: AC
  Administered 2024-05-27: 81 mg via ORAL
  Filled 2024-05-26: qty 1

## 2024-05-26 MED ORDER — ONDANSETRON HCL 4 MG/2ML IJ SOLN: 4.0000 mg | Freq: Four times a day (QID) | INTRAMUSCULAR | Status: DC | PRN

## 2024-05-26 MED ORDER — HYDRALAZINE HCL 20 MG/ML IJ SOLN: 10.0000 mg | INTRAMUSCULAR | Status: DC | PRN

## 2024-05-26 MED ORDER — METOPROLOL TARTRATE 12.5 MG HALF TABLET
12.5000 mg | ORAL_TABLET | Freq: Two times a day (BID) | ORAL | Status: DC
  Administered 2024-05-26 – 2024-05-28 (×4): 12.5 mg via ORAL
  Filled 2024-05-26 (×4): qty 1

## 2024-05-26 MED ORDER — SODIUM CHLORIDE 0.9 % WEIGHT BASED INFUSION
3.0000 mL/kg/h | INTRAVENOUS | Status: DC
  Administered 2024-05-27: 3 mL/kg/h via INTRAVENOUS

## 2024-05-26 NOTE — Progress Notes (Signed)
  Echocardiogram 2D Echocardiogram has been performed.  Koleen KANDICE Popper, RDCS 05/26/2024, 3:51 PM

## 2024-05-26 NOTE — Progress Notes (Signed)
 PHARMACY - ANTICOAGULATION  Pharmacy Consult for heparin  Indication: chest pain/ACS Brief A/P: Heparin  level within goal range Continue Heparin  at current rate   Allergies  Allergen Reactions   Codeine Itching   Morphine  And Codeine Other (See Comments)    Doesn't work   Valium [Diazepam] Other (See Comments)    Pt states it jacks him up    Patient Measurements: Height: 5' 8.5 (174 cm) Weight: 91 kg (200 lb 9.9 oz) IBW/kg (Calculated) : 69.55 HEPARIN  DW (KG): 88.2  Vital Signs: Temp: 98.5 F (36.9 C) (07/14 2315) Temp Source: Oral (07/14 1711) BP: 149/96 (07/14 2315) Pulse Rate: 77 (07/14 2315)  Labs: Recent Labs    05/25/24 1207 05/25/24 2210  HGB 16.1  --   HCT 47.8  --   PLT 213  --   HEPARINUNFRC  --  0.37  CREATININE 0.89  --     Estimated Creatinine Clearance: 84.2 mL/min (by C-G formula based on SCr of 0.89 mg/dL).  Assessment: 73 y.o. male with chest pain for heparin    Goal of Therapy:  Heparin  level 0.3-0.7 units/ml Monitor platelets by anticoagulation protocol: Yes   Plan:  No change to heparin   Follow-up am labs.  Dail Cordella Misty 05/26/2024,12:00 AM

## 2024-05-26 NOTE — Consult Note (Signed)
 Cardiology Consultation   Patient ID: [name]. MRN: [id number]; DOB: 03-18-1951  Admit date: 05/25/2024 Date of Consult: 05/26/2024  PCP:  Duanne Butler DASEN, MD   [facility] Providers Cardiologist:  Peter Swaziland, MD     Patient Profile: [name]. is a 73 y.o. male with a hx of HLD, ADD who is being seen 05/26/2024 for the evaluation of chest pain at the request of Dr. Claudene.  History of Present Illness: [name] is a 73 year old male with past medical history noted above.  Denies ever having been seen by cardiology in the past.  Follows with his PCP on a regular basis.  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  Both he and his wife give history of several weeks of intermittent chest pressure both with rest and exertion.  States he has used aspirin  on several occasions, did not notice any specific relief of symptoms with this.  Episodes seem to be more frequent the past several days.  States Friday he had 1 episode that was pretty significant but subsided on its own.  Wife spoke with the PCP office today with recommendations to present to the ED for further evaluation.   In the ED his labs showed sodium 137, potassium 4.3, creatinine 0.89, high-sensitivity troponin 53>>54>>54>>60, WBC 9.5, hemoglobin 16.1.  Chest x-ray negative.  Initial EKG on arrival showed sinus rhythm, 88 bpm, slight J-point elevation in lead II and aVF with T wave inversion in aVR.  He did report repeat episode of chest pressure while in the ED the evening of 7/14.  Repeat EKG at 1729 showed slightly more prominent ST elevation in inferior leads about 1mm.  He was started on IV heparin  and transferred to [facility] for further evaluation.  Past Medical History:  Diagnosis Date   ADD (attention deficit disorder)    HLD (hyperlipidemia)    Hypogonadism male    Nephrolithiasis    Wears glasses    Wears  partial dentures    upper partial    Past Surgical History:  Procedure Laterality Date   CHOLECYSTECTOMY N/A 03/04/2013   Procedure: LAPAROSCOPIC CHOLECYSTECTOMY WITH INTRAOPERATIVE CHOLANGIOGRAM;  Surgeon: Lynwood MALVA Pina, MD;  Location: [facility] OR;  Service: General;  Laterality: N/A;   COLONOSCOPY     DISTAL BICEPS TENDON REPAIR Left    ERCP N/A 03/03/2013   Procedure: ENDOSCOPIC RETROGRADE CHOLANGIOPANCREATOGRAPHY (ERCP);  Surgeon: Elsie Cree, MD;  Location: [facility] ENDOSCOPY;  Service: Endoscopy;  Laterality: N/A;  prone positioning   FINGER SURGERY     left index and middle tendons reattached   HERNIA REPAIR  2004   with mesh-rt ing h   INGUINAL HERNIA REPAIR Right 08/24/2013   Procedure: HERNIA REPAIR INGUINAL ADULT;  Surgeon: Lynwood MALVA Pina, MD;  Location: [facility];  Service: General;  Laterality: Right;  right inguinal   INSERTION OF MESH Right 08/24/2013   Procedure: INSERTION OF MESH;  Surgeon: Lynwood MALVA Pina, MD;  Location: [facility];  Service: General;  Laterality: Right;  right inguinal   JOINT REPLACEMENT  2012   rt and lt tkr   KNEE ARTHROSCOPY     2 times   ORIF HUMERUS FRACTURE Right 05/16/2013   Procedure: OPEN REDUCTION OF RIGHT HUMERAL HEAD, SUBSCAPULARUS FIXATION AND BIOTENDONESIS REPAIR;  Surgeon: Cordella Glendia Hutchinson, MD;  Location: [facility] OR;  Service: Orthopedics;  Laterality: Right;  ROTATOR CUFF REPAIR Right    UPPER GI ENDOSCOPY     VASECTOMY       Scheduled Meds:  aspirin  EC  81 mg Oral Daily   atorvastatin   80 mg Oral Daily   metoprolol  tartrate  12.5 mg Oral BID   Continuous Infusions:  heparin  1,100 Units/hr (05/26/24 0924)   PRN Meds: acetaminophen , ondansetron  (ZOFRAN ) IV  Allergies:    Allergies  Allergen Reactions   Codeine Itching   Morphine  And Codeine Other (See Comments)    Doesn't work   Valium [Diazepam] Other (See Comments)    Pt states it jacks him up    Social History:   Social History    Socioeconomic History   Marital status: Married    Spouse name: Not on file   Number of children: Not on file   Years of education: Not on file   Highest education level: Not on file  Occupational History   Not on file  Tobacco Use   Smoking status: Never   Smokeless tobacco: Never  Substance and Sexual Activity   Alcohol use: No   Drug use: No   Sexual activity: Not on file  Other Topics Concern   Not on file  Social History Narrative   Not on file   Social Drivers of Health   Financial Resource Strain: Not on file  Food Insecurity: No Food Insecurity (05/26/2024)   Hunger Vital Sign    Worried About Running Out of Food in the Last Year: Never true    Ran Out of Food in the Last Year: Never true  Transportation Needs: No Transportation Needs (05/26/2024)   PRAPARE - Administrator, Civil Service (Medical): No    Lack of Transportation (Non-Medical): No  Physical Activity: Not on file  Stress: Not on file  Social Connections: Socially Integrated (05/26/2024)   Social Connection and Isolation Panel    Frequency of Communication with Friends and Family: More than three times a week    Frequency of Social Gatherings with Friends and Family: More than three times a week    Attends Religious Services: More than 4 times per year    Active Member of [employer] or Organizations: Yes    Attends Engineer, structural: More than 4 times per year    Marital Status: Married  Catering manager Violence: Not At Risk (05/26/2024)   Humiliation, Afraid, Rape, and Kick questionnaire    Fear of Current or Ex-Partner: No    Emotionally Abused: No    Physically Abused: No    Sexually Abused: No    Family History:    Family History  Problem Relation Age of Onset   Cancer Mother        breast   Diabetes Father      ROS:  Please see the history of present illness.   All other ROS reviewed and negative.     Physical Exam/Data: Vitals:   05/26/24 1000 05/26/24 1200  05/26/24 1411 05/26/24 1415  BP: (!) 142/86 (!) 145/98 (!) 136/94   Pulse: 65 71 75   Resp: 13 19 19    Temp:   98.4 F (36.9 C)   TempSrc:   Oral   SpO2: 95% 97% 95%   Weight:    89.2 kg  Height:    5' 9 (1.753 m)   No intake or output data in the 24 hours ending 05/26/24 1549    05/26/2024    2:15 PM 05/25/2024  12:19 PM 07/17/2022    8:03 AM  Last 3 Weights  Weight (lbs) 196 lb 10.4 oz 200 lb 9.9 oz 201 lb 12.8 oz  Weight (kg) 89.2 kg 91 kg 91.536 kg     Body mass index is 29.04 kg/m.  General:  Well nourished, well developed, in no acute distress HEENT: normal Neck: no JVD Vascular: No carotid bruits; Distal pulses 2+ bilaterally Cardiac:  normal S1, S2; RRR; no murmur  Lungs:  clear to auscultation bilaterally, no wheezing, rhonchi or rales  Abd: soft, nontender, no hepatomegaly  Ext: no edema Musculoskeletal:  No deformities, BUE and BLE strength normal and equal Skin: warm and dry  Neuro:  CNs 2-12 intact, no focal abnormalities noted Psych:  Normal affect   EKG:  The EKG was personally reviewed and demonstrates:  Initial EKG on arrival showed sinus rhythm, 88 bpm, slight J-point elevation in lead II and aVF with T wave inversion in aVR.   Repeat EKG at 1729 showed slightly more prominent ST elevation in inferior leads about 1mm.   Telemetry:  Telemetry was personally reviewed and demonstrates:  Sinus Rhythm  Relevant CV Studies:  N/a  Laboratory Data: High Sensitivity Troponin:  No results for input(s): TROPONINIHS in the last 720 hours.   Chemistry Recent Labs  Lab 05/25/24 1207  NA 137  K 4.3  CL 103  CO2 22  GLUCOSE 91  BUN 12  CREATININE 0.89  CALCIUM  9.1  GFRNONAA >60  ANIONGAP 12    No results for input(s): PROT, ALBUMIN, AST, ALT, ALKPHOS, BILITOT in the last 168 hours. Lipids No results for input(s): CHOL, TRIG, HDL, LABVLDL, LDLCALC, CHOLHDL in the last 168 hours.  Hematology Recent Labs  Lab 05/25/24 1207  05/26/24 0818  WBC 9.5 8.3  RBC 5.44 5.56  HGB 16.1 16.4  HCT 47.8 48.6  MCV 87.9 87.4  MCH 29.6 29.5  MCHC 33.7 33.7  RDW 13.9 13.7  PLT 213 196   Thyroid No results for input(s): TSH, FREET4 in the last 168 hours.  BNPNo results for input(s): BNP, PROBNP in the last 168 hours.  DDimer No results for input(s): DDIMER in the last 168 hours.  Radiology/Studies:  DG Chest 2 View Result Date: 05/25/2024 CLINICAL DATA:  Chest pain.  Fatigue.  Dyspnea on exertion. EXAM: CHEST - 2 VIEW COMPARISON:  08/12/2008 FINDINGS: Cardiomediastinal silhouette and pulmonary vasculature are within normal limits. Lungs are clear. Advanced degenerative changes of the RIGHT glenohumeral joint. Surgical anchor seen in the RIGHT humeral head. IMPRESSION: No acute cardiopulmonary process. Electronically Signed   By: Aliene Lloyd M.D.   On: 05/25/2024 12:23   Assessment and Plan:  [name]. is a 73 y.o. male with a hx of HLD, ADD who is being seen 05/26/2024 for the evaluation of chest pain at the request of Dr. Claudene.  NSTEMI -- Has been having intermittent episodes of chest pressure for several weeks, symptoms seem to be more progressive.  Ultimately presented [facility], high-sensitivity troponin noted to be in the 50-60 range.  EKG showed slight ST changes in inferior leads on initial check with repeat later that evening showing slightly more prominent ST changes in inferior lateral leads. -- Continue IV heparin  add aspirin , transition to high-dose statin with atorvastatin  80mg  daily, and metoprolol  12.5mg  BID -- For cardiac catheterization tomorrow, unless he becomes unstable -- Echo pending  Informed Consent   Shared Decision Making/Informed Consent{ The risks [stroke (1 in 1000), death (1 in  1000), kidney failure [usually temporary] (1 in 500), bleeding (1 in 200), allergic reaction [possibly serious] (1 in 200)], benefits (diagnostic support and management of coronary artery  disease) and alternatives of a cardiac catheterization were discussed in detail with [name] and he is willing to proceed.     HLD -- On pravastatin  PTA, transition to atorvastatin  80 mg daily -- Lipid panel pending  ADD -- ritalin  PTA  Risk Assessment/Risk Scores:  TIMI Risk Score for Unstable Angina or Non-ST Elevation MI:   The patient's TIMI risk score is 5, which indicates a 26% risk of all cause mortality, new or recurrent myocardial infarction or need for urgent revascularization in the next 14 days.  For questions or updates, please contact [facility] Please consult [email or web address] for contact info under    Signed, Manuelita Rummer, NP  05/26/2024 3:49 PM

## 2024-05-26 NOTE — Care Management Obs Status (Signed)
 MEDICARE OBSERVATION STATUS NOTIFICATION   Patient Details  Name: [name]. MRN: [id number] Date of Birth: [birth year]   Medicare Observation Status Notification Given:  Yes    Waddell Barnie Rama, RN 05/26/2024, 4:05 PM

## 2024-05-26 NOTE — Progress Notes (Signed)
 PHARMACY - ANTICOAGULATION CONSULT NOTE  Pharmacy Consult for heparin  Indication: chest pain/ACS  Allergies  Allergen Reactions   Codeine Itching   Morphine  And Codeine Other (See Comments)    Doesn't work   Valium [Diazepam] Other (See Comments)    Pt states it jacks him up    Patient Measurements: Height: 5' 8.5 (174 cm) Weight: 91 kg (200 lb 9.9 oz) IBW/kg (Calculated) : 69.55 HEPARIN  DW (KG): 88.2  Vital Signs: Temp: 98 F (36.7 C) (07/15 0938) Temp Source: Oral (07/15 0312) BP: 130/96 (07/15 0938) Pulse Rate: 69 (07/15 0938)  Labs: Recent Labs    05/25/24 1207 05/25/24 2210 05/26/24 0818 05/26/24 0915  HGB 16.1  --  16.4  --   HCT 47.8  --  48.6  --   PLT 213  --  196  --   HEPARINUNFRC  --  0.37  --  0.34  CREATININE 0.89  --   --   --     Estimated Creatinine Clearance: 84.2 mL/min (by C-G formula based on SCr of 0.89 mg/dL).  Medications:  Infusions:   heparin  1,100 Units/hr (05/26/24 0924)    Assessment: 30 yof presented to the ED with CP. Started on IV heparin . CBC is WNL and he is not on anticoagulation PTA. Heparin  level remains at goal at 0.34. No bleeding noted.   Goal of Therapy:  Heparin  level 0.3-0.7 units/ml Monitor platelets by anticoagulation protocol: Yes   Plan:  Continue heparin  gtt 1100 units/hr Daily heparin  level and CBC  Francis Yardley, Vernell Helling 05/26/2024,10:32 AM

## 2024-05-26 NOTE — H&P (Signed)
 History and Physical    Patient: [name]. FMW:[id number] DOB: 02/05/1951 DOA: 05/25/2024 DOS: the patient was seen and examined on 05/26/2024 PCP: Duanne Butler DASEN, MD  Patient coming from: transfer from [facility]  Chief Complaint:  Chief Complaint  Patient presents with   Chest Pain   HPI: [name]. is a 73 y.o. male with medical history significant of hyperlipidemia, ADD, nephrolithiasis, and overweight who presents with chest tightness and fatigue.  He has been experiencing chest tightness localized to the left side for approximately two weeks, described as 'a little tight' and does not radiate. Initially, he managed the symptoms with aspirin .  He also feels unusually tired and lacking energy, coinciding with the onset of chest tightness. No palpitations have occurred during this period.  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 He used marijuana occasionally in high school but discontinued upon joining the fire department.  He is currently taking pravastatin  for cholesterol management and denies taking daily aspirin . His blood pressure is usually controlled. No shortness of breath, nausea, lightheadedness, or dizziness associated with the chest tightness. The tightness is not severe and does not significantly impact his daily activities.  In the emergency department patient was noted to be afebrile with mild tachypnea, blood pressures elevated up to 157/91, and all other vital signs maintained.  Labs significant for high-sensitivity troponin 53-> 54-> 54-> 60.  Chest x-ray showed no acute abnormality.  Review of Systems: As mentioned in the history of present illness. All other systems reviewed and are negative. Past Medical History:  Diagnosis Date   ADD (attention deficit disorder)    HLD (hyperlipidemia)    Hypogonadism male    Nephrolithiasis    Wears glasses    Wears  partial dentures    upper partial   Past Surgical History:  Procedure Laterality Date   CHOLECYSTECTOMY N/A 03/04/2013   Procedure: LAPAROSCOPIC CHOLECYSTECTOMY WITH INTRAOPERATIVE CHOLANGIOGRAM;  Surgeon: Lynwood MALVA Pina, MD;  Location: [facility] OR;  Service: General;  Laterality: N/A;   COLONOSCOPY     DISTAL BICEPS TENDON REPAIR Left    ERCP N/A 03/03/2013   Procedure: ENDOSCOPIC RETROGRADE CHOLANGIOPANCREATOGRAPHY (ERCP);  Surgeon: Elsie Cree, MD;  Location: [facility] ENDOSCOPY;  Service: Endoscopy;  Laterality: N/A;  prone positioning   FINGER SURGERY     left index and middle tendons reattached   HERNIA REPAIR  2004   with mesh-rt ing h   INGUINAL HERNIA REPAIR Right 08/24/2013   Procedure: HERNIA REPAIR INGUINAL ADULT;  Surgeon: Lynwood MALVA Pina, MD;  Location: [facility];  Service: General;  Laterality: Right;  right inguinal   INSERTION OF MESH Right 08/24/2013   Procedure: INSERTION OF MESH;  Surgeon: Lynwood MALVA Pina, MD;  Location: [facility];  Service: General;  Laterality: Right;  right inguinal   JOINT REPLACEMENT  2012   rt and lt tkr   KNEE ARTHROSCOPY     2 times   ORIF HUMERUS FRACTURE Right 05/16/2013   Procedure: OPEN REDUCTION OF RIGHT HUMERAL HEAD, SUBSCAPULARUS FIXATION AND BIOTENDONESIS REPAIR;  Surgeon: Cordella Glendia Hutchinson, MD;  Location: [facility] OR;  Service: Orthopedics;  Laterality: Right;   ROTATOR CUFF REPAIR Right    UPPER GI ENDOSCOPY     VASECTOMY     Social History:  reports that he has never smoked. He has never used smokeless tobacco. He reports that he does not drink alcohol and  does not use drugs.  Allergies  Allergen Reactions   Codeine Itching   Morphine  And Codeine Other (See Comments)    Doesn't work   Valium [Diazepam] Other (See Comments)    Pt states it jacks him up    Family History  Problem Relation Age of Onset   Cancer Mother        breast   Diabetes Father     Prior to Admission medications   Medication Sig  Start Date End Date Taking? Authorizing Provider  methylphenidate  (RITALIN ) 20 MG tablet Take 1 tablet (20 mg total) by mouth 2 (two) times daily with breakfast and lunch. 02/04/18   Duanne Butler DASEN, MD  neomycin -polymyxin-hydrocortisone (CORTISPORIN) OTIC solution Place 3 drops into both ears 4 (four) times daily. Patient not taking: Reported on 05/26/2024 07/29/23   Duanne Butler DASEN, MD  pravastatin  (PRAVACHOL ) 40 MG tablet Take 1 tablet (40 mg total) by mouth daily. 09/26/23   Duanne Butler DASEN, MD    Physical Exam: Vitals:   05/26/24 1000 05/26/24 1200 05/26/24 1411 05/26/24 1415  BP: (!) 142/86 (!) 145/98 (!) 136/94   Pulse: 65 71 75   Resp: 13 19 19    Temp:   98.4 F (36.9 C)   TempSrc:   Oral   SpO2: 95% 97% 95%   Weight:    89.2 kg  Height:    5' 9 (1.753 m)    Constitutional: Elderly male currently NAD, calm, comfortable Eyes: PERRL, lids and conjunctivae normal ENMT: Mucous membranes are moist. Normal dentition.  Neck: normal, supple Respiratory: clear to auscultation bilaterally, no wheezing, no crackles. Normal respiratory effort. No accessory muscle use.  Cardiovascular: Regular rate and rhythm, no murmurs / rubs / gallops. No extremity edema. 2+ pedal pulses.   Abdomen: no tenderness, no masses palpated.  Bowel sounds positive.  Musculoskeletal: no clubbing / cyanosis. No joint deformity upper and lower extremities. Good ROM, no contractures. Normal muscle tone.  Skin: no rashes, lesions, ulcers. No induration Neurologic: CN 2-12 grossly intact.  Strength 5/5 in all 4.  Psychiatric: Normal judgment and insight. Alert and oriented x 3. Normal mood.   Data Reviewed:  EKG revealed normal sinus rhythm 88 bpm with subtle ST wave changes in the inferior leads.  Reviewed labs, imaging, pertinent records as documented.  Assessment and Plan:  NSTEMI Acute.  Patient presents with a 2-week history of left-sided chest tightness and fatigue.  High-sensitivity troponins 53->  54-> 54-> 60.  EKG with some ST wave changes noted in the inferior leads.  Patient had been started on heparin  drip. - Admit to a cardiac telemetry bed - Continue heparin  drip - Check TSH(2.797) and lipid panel - Check echocardiogram - Cardiology consulted,  will follow-up for any further recommendations.  Elevated blood pressure readings Blood pressures elevated up to 157/91.  Patient is normally not on any medications for blood pressures and reports that his well-controlled. - Hydralazine  IV as needed - Recommend continued outpatient monitoring  Hyperlipidemia Patient had previously been on pravastatin  40 mg daily. - Check lipid panel(total cholesterol 183, LDL 100, HDL 49, and triglycerides 830) - Changed to high-dose atorvastatin  80 mg daily  Overweight BMI 29.04 kg/m  DVT prophylaxis: Heparin  Advance Care Planning:   Code Status: Full Code    Consults: Cardiology  Family Communication: Wife updated at bedside  Severity of Illness: The appropriate patient status for this patient is OBSERVATION. Observation status is judged to be reasonable and necessary in order to provide the  required intensity of service to ensure the patient's safety. The patient's presenting symptoms, physical exam findings, and initial radiographic and laboratory data in the context of their medical condition is felt to place them at decreased risk for further clinical deterioration. Furthermore, it is anticipated that the patient will be medically stable for discharge from the hospital within 2 midnights of admission.   Author: Maximino DELENA Sharps, MD 05/26/2024 2:26 PM  For on call review [email or web address].

## 2024-05-26 NOTE — TOC CM/SW Note (Signed)
 Transition of Care [facility]) - Inpatient Brief Assessment   Patient Details  Name: [name]. MRN: [id number] Date of Birth: [birth year]  Transition of [facility] CM/SW Contact:    Waddell Barnie Rama, RN Phone Number: 05/26/2024, 4:10 PM   Clinical Narrative: From home with spouse, has PCP and insurance on file, states has no HH services in place at this time or DME at home.  States  wife will transport them home at [employer] and she is support system, states gets medications from [facility].  Pta self ambulatory.  Patient gives this NCM permission to speak with wife.   Transition of Care Asessment: Insurance and Status: Insurance coverage has been reviewed Patient has primary care physician: Yes Home environment has been reviewed: home with wife Prior level of function:: indep Prior/Current Home Services: No current home services Social Drivers of Health Review: SDOH reviewed no interventions necessary Readmission risk has been reviewed: Yes Transition of care needs: no transition of care needs at this time

## 2024-05-26 NOTE — ED Notes (Signed)
 [name] with cl called for transport

## 2024-05-27 ENCOUNTER — Encounter (HOSPITAL_COMMUNITY): Payer: Self-pay | Admitting: Cardiovascular Disease

## 2024-05-27 ENCOUNTER — Observation Stay (HOSPITAL_COMMUNITY)

## 2024-05-27 ENCOUNTER — Encounter (HOSPITAL_COMMUNITY)
Admission: EM | Disposition: A | Discharge status: Home / Self Care | Payer: Self-pay | Source: Home / Self Care | Attending: Thoracic Surgery (Cardiothoracic Vascular Surgery)

## 2024-05-27 DIAGNOSIS — Z6829 Body mass index (BMI) 29.0-29.9, adult: Secondary | ICD-10-CM | POA: Diagnosis not present

## 2024-05-27 DIAGNOSIS — Z01810 Encounter for preprocedural cardiovascular examination: Secondary | ICD-10-CM

## 2024-05-27 DIAGNOSIS — I25110 Atherosclerotic heart disease of native coronary artery with unstable angina pectoris: Secondary | ICD-10-CM | POA: Diagnosis not present

## 2024-05-27 DIAGNOSIS — D72829 Elevated white blood cell count, unspecified: Secondary | ICD-10-CM | POA: Diagnosis not present

## 2024-05-27 DIAGNOSIS — J9811 Atelectasis: Secondary | ICD-10-CM | POA: Diagnosis not present

## 2024-05-27 DIAGNOSIS — F988 Other specified behavioral and emotional disorders with onset usually occurring in childhood and adolescence: Secondary | ICD-10-CM | POA: Diagnosis not present

## 2024-05-27 DIAGNOSIS — Z9889 Other specified postprocedural states: Secondary | ICD-10-CM | POA: Diagnosis not present

## 2024-05-27 DIAGNOSIS — I252 Old myocardial infarction: Secondary | ICD-10-CM | POA: Diagnosis not present

## 2024-05-27 DIAGNOSIS — I214 Non-ST elevation (NSTEMI) myocardial infarction: Secondary | ICD-10-CM | POA: Diagnosis not present

## 2024-05-27 DIAGNOSIS — I2510 Atherosclerotic heart disease of native coronary artery without angina pectoris: Secondary | ICD-10-CM | POA: Diagnosis not present

## 2024-05-27 DIAGNOSIS — D62 Acute posthemorrhagic anemia: Secondary | ICD-10-CM | POA: Diagnosis not present

## 2024-05-27 DIAGNOSIS — R918 Other nonspecific abnormal finding of lung field: Secondary | ICD-10-CM | POA: Diagnosis not present

## 2024-05-27 DIAGNOSIS — Z951 Presence of aortocoronary bypass graft: Secondary | ICD-10-CM | POA: Diagnosis not present

## 2024-05-27 DIAGNOSIS — D696 Thrombocytopenia, unspecified: Secondary | ICD-10-CM | POA: Diagnosis not present

## 2024-05-27 DIAGNOSIS — I10 Essential (primary) hypertension: Secondary | ICD-10-CM | POA: Diagnosis not present

## 2024-05-27 DIAGNOSIS — E7800 Pure hypercholesterolemia, unspecified: Secondary | ICD-10-CM | POA: Diagnosis not present

## 2024-05-27 DIAGNOSIS — Z48812 Encounter for surgical aftercare following surgery on the circulatory system: Secondary | ICD-10-CM | POA: Diagnosis not present

## 2024-05-27 DIAGNOSIS — E785 Hyperlipidemia, unspecified: Secondary | ICD-10-CM | POA: Diagnosis not present

## 2024-05-27 DIAGNOSIS — I081 Rheumatic disorders of both mitral and tricuspid valves: Secondary | ICD-10-CM | POA: Diagnosis not present

## 2024-05-27 DIAGNOSIS — E663 Overweight: Secondary | ICD-10-CM | POA: Diagnosis not present

## 2024-05-27 DIAGNOSIS — Z79899 Other long term (current) drug therapy: Secondary | ICD-10-CM | POA: Diagnosis not present

## 2024-05-27 DIAGNOSIS — Z4682 Encounter for fitting and adjustment of non-vascular catheter: Secondary | ICD-10-CM | POA: Diagnosis not present

## 2024-05-27 HISTORY — PX: LEFT HEART CATH AND CORONARY ANGIOGRAPHY: CATH118249

## 2024-05-27 LAB — CBC
HCT: 46.4 % (ref 39.0–52.0)
Hemoglobin: 15.6 g/dL (ref 13.0–17.0)
MCH: 29.1 pg (ref 26.0–34.0)
MCHC: 33.6 g/dL (ref 30.0–36.0)
MCV: 86.6 fL (ref 80.0–100.0)
Platelets: 211 K/uL (ref 150–400)
RBC: 5.36 MIL/uL (ref 4.22–5.81)
RDW: 13.6 % (ref 11.5–15.5)
WBC: 8.8 K/uL (ref 4.0–10.5)
nRBC: 0 % (ref 0.0–0.2)

## 2024-05-27 LAB — VAS US DOPPLER PRE CABG

## 2024-05-27 LAB — HEPARIN LEVEL (UNFRACTIONATED): Heparin Unfractionated: 0.26 [IU]/mL — ABNORMAL LOW (ref 0.30–0.70)

## 2024-05-27 SURGERY — LEFT HEART CATH AND CORONARY ANGIOGRAPHY
Anesthesia: LOCAL

## 2024-05-27 MED ORDER — FENTANYL CITRATE (PF) 100 MCG/2ML IJ SOLN
INTRAMUSCULAR | Status: AC
  Filled 2024-05-27: qty 2

## 2024-05-27 MED ORDER — HEPARIN (PORCINE) IN NACL 1000-0.9 UT/500ML-% IV SOLN
INTRAVENOUS | Status: DC | PRN
  Administered 2024-05-27: 1000 mL

## 2024-05-27 MED ORDER — SODIUM CHLORIDE 0.9 % IV SOLN: 250.0000 mL | INTRAVENOUS | Status: AC | PRN

## 2024-05-27 MED ORDER — FENTANYL CITRATE (PF) 100 MCG/2ML IJ SOLN
INTRAMUSCULAR | Status: DC | PRN
  Administered 2024-05-27: 25 ug via INTRAVENOUS

## 2024-05-27 MED ORDER — SODIUM CHLORIDE 0.9 % IV SOLN: INTRAVENOUS | Status: AC

## 2024-05-27 MED ORDER — HEPARIN (PORCINE) 25000 UT/250ML-% IV SOLN
1500.0000 [IU]/h | INTRAVENOUS | Status: DC
  Administered 2024-05-27: 1200 [IU]/h via INTRAVENOUS
  Administered 2024-05-28: 1500 [IU]/h via INTRAVENOUS
  Filled 2024-05-27 (×2): qty 250

## 2024-05-27 MED ORDER — VERAPAMIL HCL 2.5 MG/ML IV SOLN
INTRAVENOUS | Status: AC
  Filled 2024-05-27: qty 2

## 2024-05-27 MED ORDER — SODIUM CHLORIDE 0.9% FLUSH: 3.0000 mL | INTRAVENOUS | Status: DC | PRN

## 2024-05-27 MED ORDER — IOHEXOL 350 MG/ML SOLN
INTRAVENOUS | Status: DC | PRN
  Administered 2024-05-27: 35 mL

## 2024-05-27 MED ORDER — EZETIMIBE 10 MG PO TABS
10.0000 mg | ORAL_TABLET | Freq: Every day | ORAL | Status: DC
  Administered 2024-05-28 – 2024-06-02 (×5): 10 mg via ORAL
  Filled 2024-05-27 (×5): qty 1

## 2024-05-27 MED ORDER — HEPARIN (PORCINE) IN NACL 2-0.9 UNITS/ML
INTRAMUSCULAR | Status: DC | PRN
  Administered 2024-05-27: 10 mL via INTRA_ARTERIAL

## 2024-05-27 MED ORDER — LIDOCAINE HCL (PF) 1 % IJ SOLN
INTRAMUSCULAR | Status: DC | PRN
  Administered 2024-05-27: 2 mL via INTRADERMAL

## 2024-05-27 MED ORDER — HEPARIN SODIUM (PORCINE) 1000 UNIT/ML IJ SOLN
INTRAMUSCULAR | Status: DC | PRN
  Administered 2024-05-27: 4500 [IU] via INTRAVENOUS

## 2024-05-27 MED ORDER — HEPARIN SODIUM (PORCINE) 1000 UNIT/ML IJ SOLN
INTRAMUSCULAR | Status: AC
  Filled 2024-05-27: qty 10

## 2024-05-27 MED ORDER — SODIUM CHLORIDE 0.9% FLUSH
3.0000 mL | Freq: Two times a day (BID) | INTRAVENOUS | Status: DC
  Administered 2024-05-27 – 2024-05-28 (×4): 3 mL via INTRAVENOUS

## 2024-05-27 SURGICAL SUPPLY — 8 items
CATH INFINITI 5FR ANG PIGTAIL (CATHETERS) IMPLANT
CATH INFINITI AMBI 5FR JK (CATHETERS) IMPLANT
DEVICE RAD COMP TR BAND LRG (VASCULAR PRODUCTS) IMPLANT
GLIDESHEATH SLEND SS 6F .021 (SHEATH) IMPLANT
GUIDEWIRE INQWIRE 1.5J.035X260 (WIRE) IMPLANT
PACK CARDIAC CATHETERIZATION (CUSTOM PROCEDURE TRAY) ×1 IMPLANT
SET ATX-X65L (MISCELLANEOUS) IMPLANT
WIRE HI TORQ VERSACORE-J 145CM (WIRE) IMPLANT

## 2024-05-27 NOTE — H&P (View-Only) (Signed)
  Progress Note  Patient Name: [name]. Date of Encounter: 05/27/2024 [facility] Cardiologist: Anjelita Sheahan [place], MD new  Interval Summary   No chest pain overnight  Vital Signs Vitals:   05/27/24 0004 05/27/24 0403 05/27/24 0413 05/27/24 0819  BP: 116/73 109/76  (!) 112/92  Pulse:  69  77  Resp: 20 20  20   Temp: 98.2 F (36.8 C) 98 F (36.7 C)  98.3 F (36.8 C)  TempSrc: Oral Oral  Oral  SpO2: 93% 95%  95%  Weight:   87.7 kg   Height:        Intake/Output Summary (Last 24 hours) at 05/27/2024 0847 Last data filed at 05/27/2024 0600 Gross per 24 hour  Intake 1296.53 ml  Output --  Net 1296.53 ml      05/27/2024    4:13 AM 05/26/2024    2:15 PM 05/25/2024   12:19 PM  Last 3 Weights  Weight (lbs) 193 lb 6.4 oz 196 lb 10.4 oz 200 lb 9.9 oz  Weight (kg) 87.726 kg 89.2 kg 91 kg      Telemetry/ECG  NSR - Personally Reviewed  Physical Exam  GEN: No acute distress.   Neck: No JVD Cardiac: RRR, no murmurs, rubs, or gallops.  Respiratory: Clear to auscultation bilaterally. GI: Soft, nontender, non-distended  MS: No edema  Assessment & Plan  Chest pain. Troponins mildly elevated with flat trend. Subtle ST changes inferiorly. On IV heparin . Plan cardiac cath today. If cath negative or if he has stent would anticipate DC later today. Echo was normal.  HLD LDL 100. On lipitor  80 mg. Will add Zetia .    For questions or updates, please contact [facility] Please consult [email or web address] for contact info under       Signed, Anaiza Behrens [place], MD

## 2024-05-27 NOTE — Progress Notes (Signed)
  Progress Note  Patient Name: [name]. Date of Encounter: 05/27/2024 [facility] Cardiologist: Anjelita Sheahan [place], MD new  Interval Summary   No chest pain overnight  Vital Signs Vitals:   05/27/24 0004 05/27/24 0403 05/27/24 0413 05/27/24 0819  BP: 116/73 109/76  (!) 112/92  Pulse:  69  77  Resp: 20 20  20   Temp: 98.2 F (36.8 C) 98 F (36.7 C)  98.3 F (36.8 C)  TempSrc: Oral Oral  Oral  SpO2: 93% 95%  95%  Weight:   87.7 kg   Height:        Intake/Output Summary (Last 24 hours) at 05/27/2024 0847 Last data filed at 05/27/2024 0600 Gross per 24 hour  Intake 1296.53 ml  Output --  Net 1296.53 ml      05/27/2024    4:13 AM 05/26/2024    2:15 PM 05/25/2024   12:19 PM  Last 3 Weights  Weight (lbs) 193 lb 6.4 oz 196 lb 10.4 oz 200 lb 9.9 oz  Weight (kg) 87.726 kg 89.2 kg 91 kg      Telemetry/ECG  NSR - Personally Reviewed  Physical Exam  GEN: No acute distress.   Neck: No JVD Cardiac: RRR, no murmurs, rubs, or gallops.  Respiratory: Clear to auscultation bilaterally. GI: Soft, nontender, non-distended  MS: No edema  Assessment & Plan  Chest pain. Troponins mildly elevated with flat trend. Subtle ST changes inferiorly. On IV heparin . Plan cardiac cath today. If cath negative or if he has stent would anticipate DC later today. Echo was normal.  HLD LDL 100. On lipitor  80 mg. Will add Zetia .    For questions or updates, please contact [facility] Please consult [email or web address] for contact info under       Signed, Anaiza Behrens [place], MD

## 2024-05-27 NOTE — Progress Notes (Addendum)
 PHARMACY - ANTICOAGULATION CONSULT NOTE  Pharmacy Consult for heparin  Indication: chest pain/ACS  Allergies  Allergen Reactions   Codeine Itching   Morphine  And Codeine Other (See Comments)    Doesn't work   Valium [Diazepam] Other (See Comments)    Pt states it jacks him up    Patient Measurements: Height: 5' 9 (175.3 cm) Weight: 87.7 kg (193 lb 6.4 oz) IBW/kg (Calculated) : 70.7 HEPARIN  DW (KG): 88.6  Vital Signs: Temp: 98 F (36.7 C) (07/16 0403) Temp Source: Oral (07/16 0403) BP: 109/76 (07/16 0403) Pulse Rate: 69 (07/16 0403)  Labs: Recent Labs    05/25/24 1207 05/25/24 2210 05/26/24 0818 05/26/24 0915 05/27/24 0232  HGB 16.1  --  16.4  --  15.6  HCT 47.8  --  48.6  --  46.4  PLT 213  --  196  --  211  HEPARINUNFRC  --  0.37  --  0.34 0.26*  CREATININE 0.89  --   --   --   --     Estimated Creatinine Clearance: 83.5 mL/min (by C-G formula based on SCr of 0.89 mg/dL).    Assessment: 73 yo M presented to the ED with CP. Started on IV heparin . CBC is WNL and he is not on anticoagulation PTA.    Heparin  level 0.26 is subtherapeutic on 1100 units/hr.  No issues with infusion or bleeding per RN. Planned cath 7/16.    Goal of Therapy:  Heparin  level 0.3-0.7 units/ml Monitor platelets by anticoagulation protocol: Yes   Plan:  Increase heparin  to 1200 units/hr F/u 8hr heparin  level Monitor daily heparin  level, CBC, signs/symptoms of bleeding  F/u post-cath  Jinnie Door, PharmD, BCPS, [facility] Clinical Pharmacist  Please check AMION for all [facility] phone numbers After 10:00 PM, call Main Pharmacy [phone]

## 2024-05-27 NOTE — Progress Notes (Signed)
 Pre CABG exams completed. [name], RVT

## 2024-05-27 NOTE — Progress Notes (Signed)
 PROGRESS NOTE  [name]. FMW:[id number] DOB: 09/20/1951 DOA: 05/25/2024 PCP: Duanne Butler DASEN, MD   LOS: 0 days   Brief Narrative / Interim history: 73 y.o. male with medical history significant of hyperlipidemia, ADD, nephrolithiasis, and overweight who presents with chest tightness and fatigue.  Cardiology consulted, there was concern for non-STEMI, and was admitted to the hospital and placed on heparin  infusion.  Subjective / 24h Interval events: No chest pain this morning, awaiting cardiac catheterization  Assesement and Plan: Principal Problem:   NSTEMI (non-ST elevated myocardial infarction) (HCC) Active Problems:   Elevated blood pressure reading   HLD (hyperlipidemia)   Overweight   Principal problem Non-STEMI -cardiology consulted and following.  He was placed on heparin  infusion.  Underwent a 2D echocardiogram which showed normal LVEF, without wall motion abnormalities.  He underwent cardiac catheterization 7/16 which showed significant two-vessel and distal left main disease.  Cardiothoracic surgery consulted, plans for CABG this Friday - Continue aspirin , statin, metoprolol   Active problems Hyperlipidemia-continue statin, Zetia   Hypertension-continue metoprolol   Scheduled Meds:  aspirin  EC  81 mg Oral Daily   atorvastatin   80 mg Oral Daily   ezetimibe   10 mg Oral Daily   metoprolol  tartrate  12.5 mg Oral BID   sodium chloride  flush  3 mL Intravenous Q12H   Continuous Infusions:  sodium chloride      sodium chloride      heparin  1,100 Units/hr (05/26/24 0924)   PRN Meds:.sodium chloride , acetaminophen , hydrALAZINE , ondansetron  (ZOFRAN ) IV, sodium chloride  flush  Current Outpatient Medications  Medication Instructions   pravastatin  (PRAVACHOL ) 40 mg, Oral, Daily    Diet Orders (From admission, onward)     Start     Ordered   05/27/24 1013  Diet Heart Room service appropriate? Yes; Fluid consistency: Thin  Diet effective now       Question  Answer Comment  Room service appropriate? Yes   Fluid consistency: Thin      05/27/24 1012            DVT prophylaxis:    Lab Results  Component Value Date   PLT 211 05/27/2024      Code Status: Full Code  Family Communication: no family at bedside   Status is: Inpatient, CABG in 2 days   Level of care: Progressive  Consultants:  Cardiology Cardiothoracic surgery  Objective: Vitals:   05/27/24 1130 05/27/24 1145 05/27/24 1200 05/27/24 1244  BP: (!) 142/95 132/83 (!) 157/102 (!) 155/100  Pulse:   86 89  Resp:  18    Temp:      TempSrc:      SpO2:   95%   Weight:      Height:        Intake/Output Summary (Last 24 hours) at 05/27/2024 1255 Last data filed at 05/27/2024 1200 Gross per 24 hour  Intake 1296.53 ml  Output 100 ml  Net 1196.53 ml   Wt Readings from Last 3 Encounters:  05/27/24 87.7 kg  07/17/22 91.5 kg  05/02/21 92.1 kg    Examination:  Constitutional: NAD Eyes: no scleral icterus ENMT: Mucous membranes are moist.  Neck: normal, supple Respiratory: clear to auscultation bilaterally, no wheezing, no crackles.  Cardiovascular: Regular rate and rhythm, no murmurs / rubs / gallops. No LE edema.  Abdomen: non distended, no tenderness. Bowel sounds positive.  Musculoskeletal: no clubbing / cyanosis.    Data Reviewed: I have independently reviewed following labs and imaging studies   CBC Recent Labs  Lab 05/25/24 1207  05/26/24 0818 05/27/24 0232  WBC 9.5 8.3 8.8  HGB 16.1 16.4 15.6  HCT 47.8 48.6 46.4  PLT 213 196 211  MCV 87.9 87.4 86.6  MCH 29.6 29.5 29.1  MCHC 33.7 33.7 33.6  RDW 13.9 13.7 13.6    Recent Labs  Lab 05/25/24 1207 05/26/24 1517  NA 137  --   K 4.3  --   CL 103  --   CO2 22  --   GLUCOSE 91  --   BUN 12  --   CREATININE 0.89  --   CALCIUM  9.1  --   TSH  --  2.797    ------------------------------------------------------------------------------------------------------------------ Recent Labs     05/26/24 1517  CHOL 183  HDL 49  LDLCALC 100*  TRIG 169*  CHOLHDL 3.7    No results found for: HGBA1C ------------------------------------------------------------------------------------------------------------------ Recent Labs    05/26/24 1517  TSH 2.797    Cardiac Enzymes No results for input(s): CKMB, TROPONINI, MYOGLOBIN in the last 168 hours.  Invalid input(s): CK ------------------------------------------------------------------------------------------------------------------ No results found for: BNP  CBG: No results for input(s): GLUCAP in the last 168 hours.  No results found for this or any previous visit (from the past 240 hours).   Radiology Studies: CARDIAC CATHETERIZATION Result Date: 05/27/2024   Mid LM to Dist LM lesion is 70% stenosed.   Ost Cx to Prox Cx lesion is 90% stenosed.   Mid LAD lesion is 70% stenosed.   1st Diag lesion is 30% stenosed.   2nd Diag lesion is 30% stenosed.   Ramus lesion is 85% stenosed.   2nd Mrg lesion is 40% stenosed. 1.  Moderately calcified coronary arteries with significant two-vessel and distal left main disease. 2.  Left ventricular angiography was not performed.  EF was normal by echo. 3.  Mildly elevated left ventricular end-diastolic pressure at 17 mmHg. Recommendations: Distal left main stenosis extending into the ostium of left circumflex in addition to significant LAD and ramus disease. Recommend evaluation for CABG. Resume heparin  drip 2 hours after TR band removal.   ECHOCARDIOGRAM COMPLETE Result Date: 05/26/2024    ECHOCARDIOGRAM REPORT   Patient Name:   [name]. Date of Exam: 05/26/2024 Medical Rec #:  [id number]             Height:       69.0 in Accession #:    [id number]            Weight:       196.6 lb Date of Birth:  1951-09-08              BSA:          2.051 m Patient Age:    73 years              BP:           136/94 mmHg Patient Gender: M                     HR:           78 bpm. Exam  Location:  Inpatient Procedure: 2D Echo, Color Doppler and Cardiac Doppler (Both Spectral and Color            Flow Doppler were utilized during procedure). Indications:    Chest Pain  History:        Patient has no prior history of Echocardiogram examinations.  Signs/Symptoms:Chest Pain; Risk Factors:Dyslipidemia.  Sonographer:    Koleen Popper RDCS Referring Phys: [phone] RONDELL A SMITH IMPRESSIONS  1. Left ventricular ejection fraction, by estimation, is 60 to 65%. The left ventricle has normal function. The left ventricle has no regional wall motion abnormalities. There is mild left ventricular hypertrophy of the basal-septal segment. Left ventricular diastolic parameters are consistent with Grade I diastolic dysfunction (impaired relaxation).  2. Right ventricular systolic function is normal. The right ventricular size is normal. Tricuspid regurgitation signal is inadequate for assessing PA pressure.  3. The mitral valve is normal in structure. No evidence of mitral valve regurgitation. No evidence of mitral stenosis.  4. The aortic valve is tricuspid. Aortic valve regurgitation is not visualized. No aortic stenosis is present. FINDINGS  Left Ventricle: Left ventricular ejection fraction, by estimation, is 60 to 65%. The left ventricle has normal function. The left ventricle has no regional wall motion abnormalities. The left ventricular internal cavity size was normal in size. There is  mild left ventricular hypertrophy of the basal-septal segment. Left ventricular diastolic parameters are consistent with Grade I diastolic dysfunction (impaired relaxation). Normal left ventricular filling pressure. Right Ventricle: The right ventricular size is normal. Right vetricular wall thickness was not well visualized. Right ventricular systolic function is normal. Tricuspid regurgitation signal is inadequate for assessing PA pressure. Left Atrium: Left atrial size was normal in size. Right Atrium: Right  atrial size was normal in size. Pericardium: There is no evidence of pericardial effusion. Mitral Valve: The mitral valve is normal in structure. No evidence of mitral valve regurgitation. No evidence of mitral valve stenosis. Tricuspid Valve: The tricuspid valve is normal in structure. Tricuspid valve regurgitation is not demonstrated. Aortic Valve: The aortic valve is tricuspid. Aortic valve regurgitation is not visualized. No aortic stenosis is present. Pulmonic Valve: The pulmonic valve was grossly normal. Pulmonic valve regurgitation is trivial. No evidence of pulmonic stenosis. Aorta: The aortic root and ascending aorta are structurally normal, with no evidence of dilitation. IAS/Shunts: No atrial level shunt detected by color flow Doppler.  LEFT VENTRICLE PLAX 2D LVIDd:         4.00 cm   Diastology LVIDs:         2.60 cm   LV e' medial:    6.20 cm/s LV PW:         1.10 cm   LV E/e' medial:  9.6 LV IVS:        1.40 cm   LV e' lateral:   7.62 cm/s LVOT diam:     2.10 cm   LV E/e' lateral: 7.8 LV SV:         52 LV SV Index:   25 LVOT Area:     3.46 cm  RIGHT VENTRICLE             IVC RV S prime:     12.40 cm/s  IVC diam: 2.00 cm TAPSE (M-mode): 2.6 cm LEFT ATRIUM             Index        RIGHT ATRIUM           Index LA diam:        3.40 cm 1.66 cm/m   RA Area:     13.70 cm LA Vol (A2C):   28.5 ml 13.89 ml/m  RA Volume:   35.20 ml  17.16 ml/m LA Vol (A4C):   35.8 ml 17.45 ml/m LA Biplane Vol: 34.6 ml 16.87 ml/m  AORTIC  VALVE LVOT Vmax:   70.30 cm/s LVOT Vmean:  50.800 cm/s LVOT VTI:    0.151 m  AORTA Ao Root diam: 3.40 cm Ao Asc diam:  3.70 cm MITRAL VALVE MV Area (PHT): 3.91 cm    SHUNTS MV Decel Time: 194 msec    Systemic VTI:  0.15 m MV E velocity: 59.70 cm/s  Systemic Diam: 2.10 cm MV A velocity: 91.20 cm/s MV E/A ratio:  0.65 Mihai Croitoru MD Electronically signed by Jerel Balding MD Signature Date/Time: 05/26/2024/5:35:11 PM    Final      Nilda Fendt, MD, PhD [facility]  Between 7  am - 7 pm I am available, please contact me via Amion (for emergencies) or Securechat (non urgent messages)  Between 7 pm - 7 am I am not available, please contact night coverage MD/APP via Amion

## 2024-05-27 NOTE — H&P (View-Only) (Signed)
 [address]       [place] [address]             [phone]        [name]. [place] Medical Record #[id number] Date of Birth: 1951/08/18  Referring: No ref. provider found Primary Care: Duanne Butler DASEN, MD Primary Cardiologist:Peter Swaziland, MD  Chief Complaint:    Chief Complaint  Patient presents with   Chest Pain    History of Present Illness:    The patient is a 73 year old male we are asked to see in cardiothoracic surgical consultation for consideration of CABG.  Patient presented to the emergency department with complaints of chest tightness and fatigue.  The pain has been persistent for approximately 2 weeks.  He did not radiate.  He initially managed the symptoms with aspirin .  When the symptoms began he notes significantly more fatigue and describes a lack of energy and feeling more tired than normal.  He denies palpitations.  He does not have lower extremity edema.  He denies shortness of breath or orthopnea.  Cardiac risk factors include hyperlipidemia.  High-sensitivity troponin T's were elevated peaking at 60 ruling in for non-STEMI.  He did have some subtle ST changes inferiorly on EKG.  He is a non-smoker.  His BMI is 29.  He is not not anemic and has normal renal function.  He has no history of diabetes.  Echocardiogram and cardiac catheterization have been performed and results are as described below.  He has significant left main lesion which is 70% stenosed.  He has an ostial circumflex to proximal circumflex lesion at 90% stenosis.  Additional significant lesions are a 70% LAD stenosis and a 85% ramus gnosis.  Ejection fraction is noted to be normal by echocardiogram.  He has no significant valvular findings.  On cath he did have mildly elevated LVEDP at 17 mmHg.    Current Activity/ Functional Status: Patient is independent with mobility/ambulation, transfers, ADL's, IADL's.   Zubrod Score: At the time of surgery this  patient's most appropriate activity status/level should be described as: []     0    Normal activity, no symptoms [x]     1    Restricted in physical strenuous activity but ambulatory, able to do out light work []     2    Ambulatory and capable of self care, unable to do work activities, up and about                 more than 50%  Of the time                            []     3    Only limited self care, in bed greater than 50% of waking hours []     4    Completely disabled, no self care, confined to bed or chair []     5    Moribund  Past Medical History:  Diagnosis Date   ADD (attention deficit disorder)    HLD (hyperlipidemia)    Hypogonadism male    Nephrolithiasis    Wears glasses    Wears partial dentures    upper partial    Past Surgical History:  Procedure Laterality Date   CHOLECYSTECTOMY N/A 03/04/2013   Procedure: LAPAROSCOPIC CHOLECYSTECTOMY WITH INTRAOPERATIVE CHOLANGIOGRAM;  Surgeon: Lynwood MALVA Pina, MD;  Location: [facility] OR;  Service: General;  Laterality: N/A;  COLONOSCOPY     DISTAL BICEPS TENDON REPAIR Left    ERCP N/A 03/03/2013   Procedure: ENDOSCOPIC RETROGRADE CHOLANGIOPANCREATOGRAPHY (ERCP);  Surgeon: James Cree, MD;  Location[facility] ENDOSCOPY;  Service: Endoscopy;  Laterality: N/A;  prone positioning   FINGER SURGERY     left index and middle tendons reattached   HERNIA REPAIR  2004   with mesh-rt ing h   INGUINAL HERNIA REPAIR Right 08/24/2013   Procedure: HERNIA REPAIR INGUINAL ADULT;  Surgeon: Lynwood MALVA Pina, MD;  Location: [facility];  Service: General;  Laterality: Right;  right inguinal   INSERTION OF MESH Right 08/24/2013   Procedure: INSERTION OF MESH;  Surgeon: Lynwood MALVA Pina, MD;  Location: [facility];  Service: General;  Laterality: Right;  right inguinal   JOINT REPLACEMENT  2012   rt and lt tkr   KNEE ARTHROSCOPY     2 times   ORIF HUMERUS FRACTURE Right 05/16/2013   Procedure: OPEN REDUCTION OF RIGHT HUMERAL HEAD,  SUBSCAPULARUS FIXATION AND BIOTENDONESIS REPAIR;  Surgeon: Cordella Glendia Hutchinson, MD;  Location: [facility] OR;  Service: Orthopedics;  Laterality: Right;   ROTATOR CUFF REPAIR Right    UPPER GI ENDOSCOPY     VASECTOMY      Social History   Tobacco Use  Smoking Status Never  Smokeless Tobacco Never    Social History   Substance and Sexual Activity  Alcohol Use No     Allergies  Allergen Reactions   Codeine Itching   Morphine  And Codeine Other (See Comments)    Doesn't work   Valium [Diazepam] Other (See Comments)    Pt states it jacks him up    Current Facility-Administered Medications  Medication Dose Route Frequency Provider Last Rate Last Admin   0.9 %  sodium chloride  infusion   Intravenous Continuous Arida, Muhammad A, MD       0.9 %  sodium chloride  infusion  250 mL Intravenous PRN Arida, Muhammad A, MD       acetaminophen  (TYLENOL ) tablet 650 mg  650 mg Oral Q4H PRN Darron Deatrice LABOR, MD       aspirin  EC tablet 81 mg  81 mg Oral Daily Arida, Muhammad A, MD   81 mg at 05/26/24 1646   atorvastatin  (LIPITOR ) tablet 80 mg  80 mg Oral Daily Arida, Muhammad A, MD       ezetimibe  (ZETIA ) tablet 10 mg  10 mg Oral Daily Arida, Muhammad A, MD       heparin  ADULT infusion 100 units/mL (25000 units/250mL)  1,200 Units/hr Intravenous Continuous Darron Deatrice LABOR, MD 11 mL/hr at 05/26/24 0924 1,100 Units/hr at 05/26/24 9075   hydrALAZINE  (APRESOLINE ) injection 10 mg  10 mg Intravenous Q4H PRN Darron Deatrice LABOR, MD       metoprolol  tartrate (LOPRESSOR ) tablet 12.5 mg  12.5 mg Oral BID Arida, Muhammad A, MD   12.5 mg at 05/26/24 2208   ondansetron  (ZOFRAN ) injection 4 mg  4 mg Intravenous Q6H PRN Darron Deatrice LABOR, MD       sodium chloride  flush (NS) 0.9 % injection 3 mL  3 mL Intravenous Q12H Arida, Muhammad A, MD       sodium chloride  flush (NS) 0.9 % injection 3 mL  3 mL Intravenous PRN Arida, Muhammad A, MD        Medications Prior to Admission  Medication Sig Dispense Refill Last  Dose/Taking   pravastatin  (PRAVACHOL ) 40 MG tablet Take 1 tablet (40 mg total) by  mouth daily. (Patient taking differently: Take 40 mg by mouth at bedtime.) 90 tablet 3 05/24/2024    Family History  Problem Relation Age of Onset   Cancer Mother        breast   Diabetes Father      Review of Systems:   Review of Systems  Constitutional:  Positive for malaise/fatigue.  HENT: Negative.         Hearing loss- wears aids  Eyes: Negative.   Respiratory: Negative.    Cardiovascular:  Positive for chest pain.  Musculoskeletal:  Positive for joint pain.  Skin: Negative.   Neurological: Negative.   Endo/Heme/Allergies: Negative.   Psychiatric/Behavioral: Negative.           Physical Exam: BP (!) 150/99 (BP Location: Left Arm)   Pulse 71   Temp 98.3 F (36.8 C) (Oral)   Resp 18   Ht 5' 9 (1.753 m)   Wt 87.7 kg   SpO2 96%   BMI 28.56 kg/m    General appearance: alert, cooperative, and no distress Neck: no adenopathy, no carotid bruit, no JVD, supple, symmetrical, trachea midline, and thyroid not enlarged, symmetric, no tenderness/mass/nodules Lymph nodes: Cervical, supraclavicular, and axillary nodes normal. Resp: clear to auscultation bilaterally Back: symmetric, no curvature. ROM normal. No CVA tenderness., unable to examen Cardio: regular rate and rhythm, S1, S2 normal, no murmur, click, rub or gallop GI: soft, non-tender; bowel sounds normal; no masses,  no organomegaly Extremities: extremities normal, atraumatic, no cyanosis or edema and palpble DP/PT Neurologic: Grossly normal  Diagnostic Studies & Laboratory data:     Recent Radiology Findings:   CARDIAC CATHETERIZATION Result Date: 05/27/2024   Mid LM to Dist LM lesion is 70% stenosed.   Ost Cx to Prox Cx lesion is 90% stenosed.   Mid LAD lesion is 70% stenosed.   1st Diag lesion is 30% stenosed.   2nd Diag lesion is 30% stenosed.   Ramus lesion is 85% stenosed.   2nd Mrg lesion is 40% stenosed. 1.  Moderately  calcified coronary arteries with significant two-vessel and distal left main disease. 2.  Left ventricular angiography was not performed.  EF was normal by echo. 3.  Mildly elevated left ventricular end-diastolic pressure at 17 mmHg. Recommendations: Distal left main stenosis extending into the ostium of left circumflex in addition to significant LAD and ramus disease. Recommend evaluation for CABG. Resume heparin  drip 2 hours after TR band removal.   ECHOCARDIOGRAM COMPLETE Result Date: 05/26/2024    ECHOCARDIOGRAM REPORT   Patient Name:   [name]. Date of Exam: 05/26/2024 Medical Rec #:  [id number]             Height:       69.0 in Accession #:    [id number]            Weight:       196.6 lb Date of Birth:  19-Oct-1951              BSA:          2.051 m Patient Age:    73 years              BP:           136/94 mmHg Patient Gender: M                     HR:           78 bpm. Exam Location:  Inpatient Procedure:  2D Echo, Color Doppler and Cardiac Doppler (Both Spectral and Color            Flow Doppler were utilized during procedure). Indications:    Chest Pain  History:        Patient has no prior history of Echocardiogram examinations.                 Signs/Symptoms:Chest Pain; Risk Factors:Dyslipidemia.  Sonographer:    Koleen Popper RDCS Referring Phys: [phone] RONDELL A SMITH IMPRESSIONS  1. Left ventricular ejection fraction, by estimation, is 60 to 65%. The left ventricle has normal function. The left ventricle has no regional wall motion abnormalities. There is mild left ventricular hypertrophy of the basal-septal segment. Left ventricular diastolic parameters are consistent with Grade I diastolic dysfunction (impaired relaxation).  2. Right ventricular systolic function is normal. The right ventricular size is normal. Tricuspid regurgitation signal is inadequate for assessing PA pressure.  3. The mitral valve is normal in structure. No evidence of mitral valve regurgitation. No evidence of  mitral stenosis.  4. The aortic valve is tricuspid. Aortic valve regurgitation is not visualized. No aortic stenosis is present. FINDINGS  Left Ventricle: Left ventricular ejection fraction, by estimation, is 60 to 65%. The left ventricle has normal function. The left ventricle has no regional wall motion abnormalities. The left ventricular internal cavity size was normal in size. There is  mild left ventricular hypertrophy of the basal-septal segment. Left ventricular diastolic parameters are consistent with Grade I diastolic dysfunction (impaired relaxation). Normal left ventricular filling pressure. Right Ventricle: The right ventricular size is normal. Right vetricular wall thickness was not well visualized. Right ventricular systolic function is normal. Tricuspid regurgitation signal is inadequate for assessing PA pressure. Left Atrium: Left atrial size was normal in size. Right Atrium: Right atrial size was normal in size. Pericardium: There is no evidence of pericardial effusion. Mitral Valve: The mitral valve is normal in structure. No evidence of mitral valve regurgitation. No evidence of mitral valve stenosis. Tricuspid Valve: The tricuspid valve is normal in structure. Tricuspid valve regurgitation is not demonstrated. Aortic Valve: The aortic valve is tricuspid. Aortic valve regurgitation is not visualized. No aortic stenosis is present. Pulmonic Valve: The pulmonic valve was grossly normal. Pulmonic valve regurgitation is trivial. No evidence of pulmonic stenosis. Aorta: The aortic root and ascending aorta are structurally normal, with no evidence of dilitation. IAS/Shunts: No atrial level shunt detected by color flow Doppler.  LEFT VENTRICLE PLAX 2D LVIDd:         4.00 cm   Diastology LVIDs:         2.60 cm   LV e' medial:    6.20 cm/s LV PW:         1.10 cm   LV E/e' medial:  9.6 LV IVS:        1.40 cm   LV e' lateral:   7.62 cm/s LVOT diam:     2.10 cm   LV E/e' lateral: 7.8 LV SV:         52 LV SV  Index:   25 LVOT Area:     3.46 cm  RIGHT VENTRICLE             IVC RV S prime:     12.40 cm/s  IVC diam: 2.00 cm TAPSE (M-mode): 2.6 cm LEFT ATRIUM             Index        RIGHT ATRIUM  Index LA diam:        3.40 cm 1.66 cm/m   RA Area:     13.70 cm LA Vol (A2C):   28.5 ml 13.89 ml/m  RA Volume:   35.20 ml  17.16 ml/m LA Vol (A4C):   35.8 ml 17.45 ml/m LA Biplane Vol: 34.6 ml 16.87 ml/m  AORTIC VALVE LVOT Vmax:   70.30 cm/s LVOT Vmean:  50.800 cm/s LVOT VTI:    0.151 m  AORTA Ao Root diam: 3.40 cm Ao Asc diam:  3.70 cm MITRAL VALVE MV Area (PHT): 3.91 cm    SHUNTS MV Decel Time: 194 msec    Systemic VTI:  0.15 m MV E velocity: 59.70 cm/s  Systemic Diam: 2.10 cm MV A velocity: 91.20 cm/s MV E/A ratio:  0.65 Mihai Croitoru MD Electronically signed by Jerel Balding MD Signature Date/Time: 05/26/2024/5:35:11 PM    Final    DG Chest 2 View Result Date: 05/25/2024 CLINICAL DATA:  Chest pain.  Fatigue.  Dyspnea on exertion. EXAM: CHEST - 2 VIEW COMPARISON:  08/12/2008 FINDINGS: Cardiomediastinal silhouette and pulmonary vasculature are within normal limits. Lungs are clear. Advanced degenerative changes of the RIGHT glenohumeral joint. Surgical anchor seen in the RIGHT humeral head. IMPRESSION: No acute cardiopulmonary process. Electronically Signed   By: Aliene Lloyd M.D.   On: 05/25/2024 12:23     I have independently reviewed the above radiologic studies and discussed with the patient   Recent Lab Findings: Lab Results  Component Value Date   WBC 8.8 05/27/2024   HGB 15.6 05/27/2024   HCT 46.4 05/27/2024   PLT 211 05/27/2024   GLUCOSE 91 05/25/2024   CHOL 183 05/26/2024   TRIG 169 (H) 05/26/2024   HDL 49 05/26/2024   LDLCALC 100 (H) 05/26/2024   ALT 23 07/17/2022   AST 18 07/17/2022   NA 137 05/25/2024   K 4.3 05/25/2024   CL 103 05/25/2024   CREATININE 0.89 05/25/2024   BUN 12 05/25/2024   CO2 22 05/25/2024   TSH 2.797 05/26/2024   INR 2.1 (H) 11/26/2008       Assessment / Plan: Severe left main and two-vessel coronary artery disease in the setting of non-STEMI , normal EF      Plan CABG, surgeon to review studies    I  spent 30 minutes counseling the patient face to face.  Lemond FORBES Cera, PA-C  05/27/2024 10:36 AM  Patient seen and examined, records and cath images reviwed.  73 yo man with multiple CRF but no prior history presents with a 2 week history of fatigue and chest tightness.  Troponin + for non STEMi.  Cath shows left main and 2 vessel CAD.  CABG indicated for survival benefit and relief of symptoms.  I discussed the general nature of the procedure, including the need for general anesthesia, the incisions to be used, the use of cardiopulmonary bypass, and the use of temporary pacemaker wires and drainage tubes postoperatively with Mr and Mrs Hurta.  We discussed the expected hospital stay, overall recovery and short and long term outcomes. I informed them of the indications, risks, benefits and alternatives.   He understands the risks include, but are not limited to death, stroke, MI, DVT/PE, bleeding, possible need for transfusion, infections, cardiac arrhythmias, as well as other organ system dysfunction including respiratory, renal, or GI complications.    Plan CABG Friday  Elspeth C. Kerrin, MD [facility] [phone]

## 2024-05-27 NOTE — Consult Note (Addendum)
 [address]       [place] [address]             [phone]        [name] Medical Record #[id number] Date of Birth: 1951/08/18  Referring: No ref. provider found Primary Care: Duanne Butler DASEN, MD Primary Cardiologist:Peter Swaziland, MD  Chief Complaint:    Chief Complaint  Patient presents with   Chest Pain    History of Present Illness:    The patient is a 73 year old male we are asked to see in cardiothoracic surgical consultation for consideration of CABG.  Patient presented to the emergency department with complaints of chest tightness and fatigue.  The pain has been persistent for approximately 2 weeks.  He did not radiate.  He initially managed the symptoms with aspirin .  When the symptoms began he notes significantly more fatigue and describes a lack of energy and feeling more tired than normal.  He denies palpitations.  He does not have lower extremity edema.  He denies shortness of breath or orthopnea.  Cardiac risk factors include hyperlipidemia.  High-sensitivity troponin T's were elevated peaking at 60 ruling in for non-STEMI.  He did have some subtle ST changes inferiorly on EKG.  He is a non-smoker.  His BMI is 29.  He is not not anemic and has normal renal function.  He has no history of diabetes.  Echocardiogram and cardiac catheterization have been performed and results are as described below.  He has significant left main lesion which is 70% stenosed.  He has an ostial circumflex to proximal circumflex lesion at 90% stenosis.  Additional significant lesions are a 70% LAD stenosis and a 85% ramus gnosis.  Ejection fraction is noted to be normal by echocardiogram.  He has no significant valvular findings.  On cath he did have mildly elevated LVEDP at 17 mmHg.    Current Activity/ Functional Status: Patient is independent with mobility/ambulation, transfers, ADL's, IADL's.   Zubrod Score: At the time of surgery this  patient's most appropriate activity status/level should be described as: []     0    Normal activity, no symptoms [x]     1    Restricted in physical strenuous activity but ambulatory, able to do out light work []     2    Ambulatory and capable of self care, unable to do work activities, up and about                 more than 50%  Of the time                            []     3    Only limited self care, in bed greater than 50% of waking hours []     4    Completely disabled, no self care, confined to bed or chair []     5    Moribund  Past Medical History:  Diagnosis Date   ADD (attention deficit disorder)    HLD (hyperlipidemia)    Hypogonadism male    Nephrolithiasis    Wears glasses    Wears partial dentures    upper partial    Past Surgical History:  Procedure Laterality Date   CHOLECYSTECTOMY N/A 03/04/2013   Procedure: LAPAROSCOPIC CHOLECYSTECTOMY WITH INTRAOPERATIVE CHOLANGIOGRAM;  Surgeon: Lynwood MALVA Pina, MD;  Location: [facility] OR;  Service: General;  Laterality: N/A;  COLONOSCOPY     DISTAL BICEPS TENDON REPAIR Left    ERCP N/A 03/03/2013   Procedure: ENDOSCOPIC RETROGRADE CHOLANGIOPANCREATOGRAPHY (ERCP);  Surgeon: Elsie Cree, MD;  Location[facility] ENDOSCOPY;  Service: Endoscopy;  Laterality: N/A;  prone positioning   FINGER SURGERY     left index and middle tendons reattached   HERNIA REPAIR  2004   with mesh-rt ing h   INGUINAL HERNIA REPAIR Right 08/24/2013   Procedure: HERNIA REPAIR INGUINAL ADULT;  Surgeon: Lynwood MALVA Pina, MD;  Location: [facility];  Service: General;  Laterality: Right;  right inguinal   INSERTION OF MESH Right 08/24/2013   Procedure: INSERTION OF MESH;  Surgeon: Lynwood MALVA Pina, MD;  Location: [facility];  Service: General;  Laterality: Right;  right inguinal   JOINT REPLACEMENT  2012   rt and lt tkr   KNEE ARTHROSCOPY     2 times   ORIF HUMERUS FRACTURE Right 05/16/2013   Procedure: OPEN REDUCTION OF RIGHT HUMERAL HEAD,  SUBSCAPULARUS FIXATION AND BIOTENDONESIS REPAIR;  Surgeon: Cordella Glendia Hutchinson, MD;  Location: [facility] OR;  Service: Orthopedics;  Laterality: Right;   ROTATOR CUFF REPAIR Right    UPPER GI ENDOSCOPY     VASECTOMY      Social History   Tobacco Use  Smoking Status Never  Smokeless Tobacco Never    Social History   Substance and Sexual Activity  Alcohol Use No     Allergies  Allergen Reactions   Codeine Itching   Morphine  And Codeine Other (See Comments)    Doesn't work   Valium [Diazepam] Other (See Comments)    Pt states it jacks him up    Current Facility-Administered Medications  Medication Dose Route Frequency Provider Last Rate Last Admin   0.9 %  sodium chloride  infusion   Intravenous Continuous Arida, Muhammad A, MD       0.9 %  sodium chloride  infusion  250 mL Intravenous PRN Arida, Muhammad A, MD       acetaminophen  (TYLENOL ) tablet 650 mg  650 mg Oral Q4H PRN Darron Deatrice LABOR, MD       aspirin  EC tablet 81 mg  81 mg Oral Daily Arida, Muhammad A, MD   81 mg at 05/26/24 1646   atorvastatin  (LIPITOR ) tablet 80 mg  80 mg Oral Daily Arida, Muhammad A, MD       ezetimibe  (ZETIA ) tablet 10 mg  10 mg Oral Daily Arida, Muhammad A, MD       heparin  ADULT infusion 100 units/mL (25000 units/250mL)  1,200 Units/hr Intravenous Continuous Darron Deatrice LABOR, MD 11 mL/hr at 05/26/24 0924 1,100 Units/hr at 05/26/24 9075   hydrALAZINE  (APRESOLINE ) injection 10 mg  10 mg Intravenous Q4H PRN Darron Deatrice LABOR, MD       metoprolol  tartrate (LOPRESSOR ) tablet 12.5 mg  12.5 mg Oral BID Arida, Muhammad A, MD   12.5 mg at 05/26/24 2208   ondansetron  (ZOFRAN ) injection 4 mg  4 mg Intravenous Q6H PRN Darron Deatrice LABOR, MD       sodium chloride  flush (NS) 0.9 % injection 3 mL  3 mL Intravenous Q12H Arida, Muhammad A, MD       sodium chloride  flush (NS) 0.9 % injection 3 mL  3 mL Intravenous PRN Arida, Muhammad A, MD        Medications Prior to Admission  Medication Sig Dispense Refill Last  Dose/Taking   pravastatin  (PRAVACHOL ) 40 MG tablet Take 1 tablet (40 mg total) by  mouth daily. (Patient taking differently: Take 40 mg by mouth at bedtime.) 90 tablet 3 05/24/2024    Family History  Problem Relation Age of Onset   Cancer Mother        breast   Diabetes Father      Review of Systems:   Review of Systems  Constitutional:  Positive for malaise/fatigue.  HENT: Negative.         Hearing loss- wears aids  Eyes: Negative.   Respiratory: Negative.    Cardiovascular:  Positive for chest pain.  Musculoskeletal:  Positive for joint pain.  Skin: Negative.   Neurological: Negative.   Endo/Heme/Allergies: Negative.   Psychiatric/Behavioral: Negative.           Physical Exam: BP (!) 150/99 (BP Location: Left Arm)   Pulse 71   Temp 98.3 F (36.8 C) (Oral)   Resp 18   Ht 5' 9 (1.753 m)   Wt 87.7 kg   SpO2 96%   BMI 28.56 kg/m    General appearance: alert, cooperative, and no distress Neck: no adenopathy, no carotid bruit, no JVD, supple, symmetrical, trachea midline, and thyroid not enlarged, symmetric, no tenderness/mass/nodules Lymph nodes: Cervical, supraclavicular, and axillary nodes normal. Resp: clear to auscultation bilaterally Back: symmetric, no curvature. ROM normal. No CVA tenderness., unable to examen Cardio: regular rate and rhythm, S1, S2 normal, no murmur, click, rub or gallop GI: soft, non-tender; bowel sounds normal; no masses,  no organomegaly Extremities: extremities normal, atraumatic, no cyanosis or edema and palpble DP/PT Neurologic: Grossly normal  Diagnostic Studies & Laboratory data:     Recent Radiology Findings:   CARDIAC CATHETERIZATION Result Date: 05/27/2024   Mid LM to Dist LM lesion is 70% stenosed.   Ost Cx to Prox Cx lesion is 90% stenosed.   Mid LAD lesion is 70% stenosed.   1st Diag lesion is 30% stenosed.   2nd Diag lesion is 30% stenosed.   Ramus lesion is 85% stenosed.   2nd Mrg lesion is 40% stenosed. 1.  Moderately  calcified coronary arteries with significant two-vessel and distal left main disease. 2.  Left ventricular angiography was not performed.  EF was normal by echo. 3.  Mildly elevated left ventricular end-diastolic pressure at 17 mmHg. Recommendations: Distal left main stenosis extending into the ostium of left circumflex in addition to significant LAD and ramus disease. Recommend evaluation for CABG. Resume heparin  drip 2 hours after TR band removal.   ECHOCARDIOGRAM COMPLETE Result Date: 05/26/2024    ECHOCARDIOGRAM REPORT   Patient Name:   Derryl Uher. Date of Exam: 05/26/2024 Medical Rec #:  [id number]             Height:       69.0 in Accession #:    [id number]            Weight:       196.6 lb Date of Birth:  19-Oct-1951              BSA:          2.051 m Patient Age:    73 years              BP:           136/94 mmHg Patient Gender: M                     HR:           78 bpm. Exam Location:  Inpatient Procedure:  2D Echo, Color Doppler and Cardiac Doppler (Both Spectral and Color            Flow Doppler were utilized during procedure). Indications:    Chest Pain  History:        Patient has no prior history of Echocardiogram examinations.                 Signs/Symptoms:Chest Pain; Risk Factors:Dyslipidemia.  Sonographer:    Koleen Popper RDCS Referring Phys: [phone] RONDELL A SMITH IMPRESSIONS  1. Left ventricular ejection fraction, by estimation, is 60 to 65%. The left ventricle has normal function. The left ventricle has no regional wall motion abnormalities. There is mild left ventricular hypertrophy of the basal-septal segment. Left ventricular diastolic parameters are consistent with Grade I diastolic dysfunction (impaired relaxation).  2. Right ventricular systolic function is normal. The right ventricular size is normal. Tricuspid regurgitation signal is inadequate for assessing PA pressure.  3. The mitral valve is normal in structure. No evidence of mitral valve regurgitation. No evidence of  mitral stenosis.  4. The aortic valve is tricuspid. Aortic valve regurgitation is not visualized. No aortic stenosis is present. FINDINGS  Left Ventricle: Left ventricular ejection fraction, by estimation, is 60 to 65%. The left ventricle has normal function. The left ventricle has no regional wall motion abnormalities. The left ventricular internal cavity size was normal in size. There is  mild left ventricular hypertrophy of the basal-septal segment. Left ventricular diastolic parameters are consistent with Grade I diastolic dysfunction (impaired relaxation). Normal left ventricular filling pressure. Right Ventricle: The right ventricular size is normal. Right vetricular wall thickness was not well visualized. Right ventricular systolic function is normal. Tricuspid regurgitation signal is inadequate for assessing PA pressure. Left Atrium: Left atrial size was normal in size. Right Atrium: Right atrial size was normal in size. Pericardium: There is no evidence of pericardial effusion. Mitral Valve: The mitral valve is normal in structure. No evidence of mitral valve regurgitation. No evidence of mitral valve stenosis. Tricuspid Valve: The tricuspid valve is normal in structure. Tricuspid valve regurgitation is not demonstrated. Aortic Valve: The aortic valve is tricuspid. Aortic valve regurgitation is not visualized. No aortic stenosis is present. Pulmonic Valve: The pulmonic valve was grossly normal. Pulmonic valve regurgitation is trivial. No evidence of pulmonic stenosis. Aorta: The aortic root and ascending aorta are structurally normal, with no evidence of dilitation. IAS/Shunts: No atrial level shunt detected by color flow Doppler.  LEFT VENTRICLE PLAX 2D LVIDd:         4.00 cm   Diastology LVIDs:         2.60 cm   LV e' medial:    6.20 cm/s LV PW:         1.10 cm   LV E/e' medial:  9.6 LV IVS:        1.40 cm   LV e' lateral:   7.62 cm/s LVOT diam:     2.10 cm   LV E/e' lateral: 7.8 LV SV:         52 LV SV  Index:   25 LVOT Area:     3.46 cm  RIGHT VENTRICLE             IVC RV S prime:     12.40 cm/s  IVC diam: 2.00 cm TAPSE (M-mode): 2.6 cm LEFT ATRIUM             Index        RIGHT ATRIUM  Index LA diam:        3.40 cm 1.66 cm/m   RA Area:     13.70 cm LA Vol (A2C):   28.5 ml 13.89 ml/m  RA Volume:   35.20 ml  17.16 ml/m LA Vol (A4C):   35.8 ml 17.45 ml/m LA Biplane Vol: 34.6 ml 16.87 ml/m  AORTIC VALVE LVOT Vmax:   70.30 cm/s LVOT Vmean:  50.800 cm/s LVOT VTI:    0.151 m  AORTA Ao Root diam: 3.40 cm Ao Asc diam:  3.70 cm MITRAL VALVE MV Area (PHT): 3.91 cm    SHUNTS MV Decel Time: 194 msec    Systemic VTI:  0.15 m MV E velocity: 59.70 cm/s  Systemic Diam: 2.10 cm MV A velocity: 91.20 cm/s MV E/A ratio:  0.65 Mihai Croitoru MD Electronically signed by Jerel Balding MD Signature Date/Time: 05/26/2024/5:35:11 PM    Final    DG Chest 2 View Result Date: 05/25/2024 CLINICAL DATA:  Chest pain.  Fatigue.  Dyspnea on exertion. EXAM: CHEST - 2 VIEW COMPARISON:  08/12/2008 FINDINGS: Cardiomediastinal silhouette and pulmonary vasculature are within normal limits. Lungs are clear. Advanced degenerative changes of the RIGHT glenohumeral joint. Surgical anchor seen in the RIGHT humeral head. IMPRESSION: No acute cardiopulmonary process. Electronically Signed   By: Aliene Lloyd M.D.   On: 05/25/2024 12:23     I have independently reviewed the above radiologic studies and discussed with the patient   Recent Lab Findings: Lab Results  Component Value Date   WBC 8.8 05/27/2024   HGB 15.6 05/27/2024   HCT 46.4 05/27/2024   PLT 211 05/27/2024   GLUCOSE 91 05/25/2024   CHOL 183 05/26/2024   TRIG 169 (H) 05/26/2024   HDL 49 05/26/2024   LDLCALC 100 (H) 05/26/2024   ALT 23 07/17/2022   AST 18 07/17/2022   NA 137 05/25/2024   K 4.3 05/25/2024   CL 103 05/25/2024   CREATININE 0.89 05/25/2024   BUN 12 05/25/2024   CO2 22 05/25/2024   TSH 2.797 05/26/2024   INR 2.1 (H) 11/26/2008       Assessment / Plan: Severe left main and two-vessel coronary artery disease in the setting of non-STEMI , normal EF      Plan CABG, surgeon to review studies    I  spent 30 minutes counseling the patient face to face.  Lemond FORBES Cera, PA-C  05/27/2024 10:36 AM  Patient seen and examined, records and cath images reviwed.  73 yo man with multiple CRF but no prior history presents with a 2 week history of fatigue and chest tightness.  Troponin + for non STEMi.  Cath shows left main and 2 vessel CAD.  CABG indicated for survival benefit and relief of symptoms.  I discussed the general nature of the procedure, including the need for general anesthesia, the incisions to be used, the use of cardiopulmonary bypass, and the use of temporary pacemaker wires and drainage tubes postoperatively with Mr and Mrs Hurta.  We discussed the expected hospital stay, overall recovery and short and long term outcomes. I informed them of the indications, risks, benefits and alternatives.   He understands the risks include, but are not limited to death, stroke, MI, DVT/PE, bleeding, possible need for transfusion, infections, cardiac arrhythmias, as well as other organ system dysfunction including respiratory, renal, or GI complications.    Plan CABG Friday  Elspeth C. Kerrin, MD [facility] [phone]

## 2024-05-27 NOTE — Interval H&P Note (Signed)
 History and Physical Interval Note:  05/27/2024 9:29 AM  [name].  has presented today for surgery, with the diagnosis of chest pain.  The various methods of treatment have been discussed with the patient and family. After consideration of risks, benefits and other options for treatment, the patient has consented to  Procedure(s): LEFT HEART CATH AND CORONARY ANGIOGRAPHY (N/A) as a surgical intervention.  The patient's history has been reviewed, patient examined, no change in status, stable for surgery.  I have reviewed the patient's chart and labs.  Questions were answered to the patient's satisfaction.     Anesia Blackwell

## 2024-05-28 DIAGNOSIS — E7800 Pure hypercholesterolemia, unspecified: Secondary | ICD-10-CM | POA: Diagnosis not present

## 2024-05-28 DIAGNOSIS — I214 Non-ST elevation (NSTEMI) myocardial infarction: Secondary | ICD-10-CM | POA: Diagnosis not present

## 2024-05-28 LAB — CBC
HCT: 44.9 % (ref 39.0–52.0)
HCT: 50.2 % (ref 39.0–52.0)
Hemoglobin: 15.1 g/dL (ref 13.0–17.0)
Hemoglobin: 16.7 g/dL (ref 13.0–17.0)
MCH: 29.2 pg (ref 26.0–34.0)
MCH: 29.4 pg (ref 26.0–34.0)
MCHC: 33.3 g/dL (ref 30.0–36.0)
MCHC: 33.6 g/dL (ref 30.0–36.0)
MCV: 87.5 fL (ref 80.0–100.0)
MCV: 87.9 fL (ref 80.0–100.0)
Platelets: 205 K/uL (ref 150–400)
Platelets: 224 K/uL (ref 150–400)
RBC: 5.13 MIL/uL (ref 4.22–5.81)
RBC: 5.71 MIL/uL (ref 4.22–5.81)
RDW: 13.7 % (ref 11.5–15.5)
RDW: 13.8 % (ref 11.5–15.5)
WBC: 12.2 K/uL — ABNORMAL HIGH (ref 4.0–10.5)
WBC: 9.2 K/uL (ref 4.0–10.5)
nRBC: 0 % (ref 0.0–0.2)
nRBC: 0 % (ref 0.0–0.2)

## 2024-05-28 LAB — HEPARIN LEVEL (UNFRACTIONATED)
Heparin Unfractionated: 0.27 [IU]/mL — ABNORMAL LOW (ref 0.30–0.70)
Heparin Unfractionated: 0.15 [IU]/mL — ABNORMAL LOW (ref 0.30–0.70)
Heparin Unfractionated: 0.49 [IU]/mL (ref 0.30–0.70)

## 2024-05-28 LAB — TYPE AND SCREEN
ABO/RH(D): A POS
Antibody Screen: NEGATIVE

## 2024-05-28 LAB — SURGICAL PCR SCREEN
MRSA, PCR: NEGATIVE
Staphylococcus aureus: NEGATIVE

## 2024-05-28 MED ORDER — MAGNESIUM SULFATE 50 % IJ SOLN
40.0000 meq | INTRAMUSCULAR | Status: DC
  Filled 2024-05-28: qty 9.85

## 2024-05-28 MED ORDER — TRANEXAMIC ACID (OHS) BOLUS VIA INFUSION
15.0000 mg/kg | INTRAVENOUS | Status: AC
  Administered 2024-05-29: 1324.5 mg via INTRAVENOUS
  Filled 2024-05-28: qty 1325

## 2024-05-28 MED ORDER — NOREPINEPHRINE 4 MG/250ML-% IV SOLN
0.0000 ug/min | INTRAVENOUS | Status: DC
  Filled 2024-05-28: qty 250

## 2024-05-28 MED ORDER — INSULIN REGULAR(HUMAN) IN NACL 100-0.9 UT/100ML-% IV SOLN
INTRAVENOUS | Status: AC
  Administered 2024-05-29: 1.4 [IU]/h via INTRAVENOUS
  Filled 2024-05-28: qty 100

## 2024-05-28 MED ORDER — TRANEXAMIC ACID 1000 MG/10ML IV SOLN
1.5000 mg/kg/h | INTRAVENOUS | Status: AC
  Administered 2024-05-29: 1.5 mg/kg/h via INTRAVENOUS
  Filled 2024-05-28: qty 25

## 2024-05-28 MED ORDER — PLASMA-LYTE A IV SOLN
INTRAVENOUS | Status: DC
  Filled 2024-05-28: qty 2.5

## 2024-05-28 MED ORDER — MUPIROCIN 2 % EX OINT
1.0000 | TOPICAL_OINTMENT | Freq: Two times a day (BID) | CUTANEOUS | Status: AC
  Administered 2024-05-28 – 2024-06-01 (×9): 1 via NASAL
  Filled 2024-05-28 (×4): qty 22

## 2024-05-28 MED ORDER — METOPROLOL TARTRATE 12.5 MG HALF TABLET
12.5000 mg | ORAL_TABLET | Freq: Once | ORAL | Status: AC
  Administered 2024-05-29: 12.5 mg via ORAL
  Filled 2024-05-28: qty 1

## 2024-05-28 MED ORDER — TRANEXAMIC ACID (OHS) PUMP PRIME SOLUTION
2.0000 mg/kg | INTRAVENOUS | Status: DC
  Filled 2024-05-28: qty 1.77

## 2024-05-28 MED ORDER — PHENYLEPHRINE HCL-NACL 20-0.9 MG/250ML-% IV SOLN
30.0000 ug/min | INTRAVENOUS | Status: AC
  Administered 2024-05-29: 15 ug/min via INTRAVENOUS
  Filled 2024-05-28: qty 250

## 2024-05-28 MED ORDER — CEFAZOLIN SODIUM-DEXTROSE 2-4 GM/100ML-% IV SOLN
2.0000 g | INTRAVENOUS | Status: AC
  Administered 2024-05-29 (×2): 2 g via INTRAVENOUS
  Filled 2024-05-28: qty 100

## 2024-05-28 MED ORDER — EPINEPHRINE HCL 5 MG/250ML IV SOLN IN NS
0.0000 ug/min | INTRAVENOUS | Status: DC
  Filled 2024-05-28: qty 250

## 2024-05-28 MED ORDER — VANCOMYCIN HCL 1.5 G IV SOLR
1500.0000 mg | INTRAVENOUS | Status: AC
  Administered 2024-05-29: 1500 mg via INTRAVENOUS
  Filled 2024-05-28: qty 30

## 2024-05-28 MED ORDER — CHLORHEXIDINE GLUCONATE CLOTH 2 % EX PADS
6.0000 | MEDICATED_PAD | Freq: Once | CUTANEOUS | Status: AC
  Administered 2024-05-28: 6 via TOPICAL

## 2024-05-28 MED ORDER — CHLORHEXIDINE GLUCONATE 0.12 % MT SOLN
15.0000 mL | Freq: Once | OROMUCOSAL | Status: AC
  Administered 2024-05-28: 15 mL via OROMUCOSAL
  Filled 2024-05-28: qty 15

## 2024-05-28 MED ORDER — HEPARIN 30,000 UNITS/1000 ML (OHS) CELLSAVER SOLUTION
Status: DC
  Filled 2024-05-28: qty 1000

## 2024-05-28 MED ORDER — NITROGLYCERIN IN D5W 200-5 MCG/ML-% IV SOLN
2.0000 ug/min | INTRAVENOUS | Status: DC
  Filled 2024-05-28: qty 250

## 2024-05-28 MED ORDER — POTASSIUM CHLORIDE 2 MEQ/ML IV SOLN
80.0000 meq | INTRAVENOUS | Status: DC
  Filled 2024-05-28: qty 40

## 2024-05-28 MED ORDER — CEFAZOLIN SODIUM-DEXTROSE 2-4 GM/100ML-% IV SOLN
2.0000 g | INTRAVENOUS | Status: DC
  Filled 2024-05-28: qty 100

## 2024-05-28 MED ORDER — MILRINONE LACTATE IN DEXTROSE 20-5 MG/100ML-% IV SOLN
0.3000 ug/kg/min | INTRAVENOUS | Status: DC
  Filled 2024-05-28: qty 100

## 2024-05-28 MED ORDER — DEXMEDETOMIDINE HCL IN NACL 400 MCG/100ML IV SOLN
0.1000 ug/kg/h | INTRAVENOUS | Status: AC
  Administered 2024-05-29: .3 ug/kg/h via INTRAVENOUS
  Filled 2024-05-28: qty 100

## 2024-05-28 NOTE — Progress Notes (Signed)
 PROGRESS NOTE  [name]. FMW:[id number] DOB: Apr 11, 1951 DOA: 05/25/2024 PCP: Duanne Butler DASEN, MD   LOS: 1 day   Brief Narrative / Interim history: 73 y.o. male with medical history significant of hyperlipidemia, ADD, nephrolithiasis, and overweight who presents with chest tightness and fatigue.  Cardiology consulted, there was concern for non-STEMI, and was admitted to the hospital and placed on heparin  infusion.  Subjective / 24h Interval events: Very surprised about the fact that he needs to have open heart surgery.  No chest pain this morning, no shortness of breath  Assesement and Plan: Principal Problem:   NSTEMI (non-ST elevated myocardial infarction) (HCC) Active Problems:   Elevated blood pressure reading   HLD (hyperlipidemia)   Overweight   Principal problem Non-STEMI -cardiology consulted and following.  He was placed on heparin  infusion.  Underwent a 2D echocardiogram which showed normal LVEF, without wall motion abnormalities.  He underwent cardiac catheterization 7/16 which showed significant two-vessel and distal left main disease.  Cardiothoracic surgery consulted, plans for CABG tomorrow - Continue aspirin , statin, metoprolol .  Add Zetia  per cardiology.  Remains chest pain-free  Active problems Hyperlipidemia-continue statin, Zetia   Hypertension-continue metoprolol   Scheduled Meds:  aspirin  EC  81 mg Oral Daily   atorvastatin   80 mg Oral Daily   ezetimibe   10 mg Oral Daily   metoprolol  tartrate  12.5 mg Oral BID   mupirocin  ointment  1 Application Nasal BID   sodium chloride  flush  3 mL Intravenous Q12H   Continuous Infusions:  sodium chloride      heparin  1,300 Units/hr (05/28/24 0407)   PRN Meds:.sodium chloride , acetaminophen , hydrALAZINE , ondansetron  (ZOFRAN ) IV, sodium chloride  flush  Current Outpatient Medications  Medication Instructions   pravastatin  (PRAVACHOL ) 40 mg, Oral, Daily    Diet Orders (From admission, onward)     Start      Ordered   05/27/24 1013  Diet Heart Room service appropriate? Yes; Fluid consistency: Thin  Diet effective now       Question Answer Comment  Room service appropriate? Yes   Fluid consistency: Thin      05/27/24 1012            DVT prophylaxis:    Lab Results  Component Value Date   PLT 205 05/28/2024      Code Status: Full Code  Family Communication: no family at bedside   Status is: Inpatient, CABG in 2 days   Level of care: Telemetry Cardiac  Consultants:  Cardiology Cardiothoracic surgery  Objective: Vitals:   05/27/24 2326 05/28/24 0406 05/28/24 0545 05/28/24 0855  BP: 114/60 106/77  123/80  Pulse:      Resp: 18 18  18   Temp: (!) 97.5 F (36.4 C) (!) 97.5 F (36.4 C)  98.2 F (36.8 C)  TempSrc: Oral Oral  Oral  SpO2: 96% 95%  95%  Weight:   88.3 kg   Height:        Intake/Output Summary (Last 24 hours) at 05/28/2024 1031 Last data filed at 05/28/2024 0843 Gross per 24 hour  Intake 554.9 ml  Output 1175 ml  Net -620.1 ml   Wt Readings from Last 3 Encounters:  05/28/24 88.3 kg  07/17/22 91.5 kg  05/02/21 92.1 kg    Examination:  Constitutional: NAD Eyes: lids and conjunctivae normal, no scleral icterus ENMT: mmm Neck: normal, supple Respiratory: clear to auscultation bilaterally, no wheezing, no crackles. Normal respiratory effort.  Cardiovascular: Regular rate and rhythm, no murmurs / rubs / gallops. No LE  edema. Abdomen: soft, no distention, no tenderness. Bowel sounds positive.    Data Reviewed: I have independently reviewed following labs and imaging studies   CBC Recent Labs  Lab 05/25/24 1207 05/26/24 0818 05/27/24 0232 05/28/24 0217  WBC 9.5 8.3 8.8 9.2  HGB 16.1 16.4 15.6 15.1  HCT 47.8 48.6 46.4 44.9  PLT 213 196 211 205  MCV 87.9 87.4 86.6 87.5  MCH 29.6 29.5 29.1 29.4  MCHC 33.7 33.7 33.6 33.6  RDW 13.9 13.7 13.6 13.8    Recent Labs  Lab 05/25/24 1207 05/26/24 1517  NA 137  --   K 4.3  --   CL 103  --    CO2 22  --   GLUCOSE 91  --   BUN 12  --   CREATININE 0.89  --   CALCIUM  9.1  --   TSH  --  2.797    ------------------------------------------------------------------------------------------------------------------ Recent Labs    05/26/24 1517  CHOL 183  HDL 49  LDLCALC 100*  TRIG 169*  CHOLHDL 3.7    No results found for: HGBA1C ------------------------------------------------------------------------------------------------------------------ Recent Labs    05/26/24 1517  TSH 2.797    Cardiac Enzymes No results for input(s): CKMB, TROPONINI, MYOGLOBIN in the last 168 hours.  Invalid input(s): CK ------------------------------------------------------------------------------------------------------------------ No results found for: BNP  CBG: No results for input(s): GLUCAP in the last 168 hours.  Recent Results (from the past 240 hours)  Surgical PCR screen     Status: None   Collection Time: 05/28/24  5:45 AM   Specimen: Nasal Mucosa; Nasal Swab  Result Value Ref Range Status   MRSA, PCR NEGATIVE NEGATIVE Final   Staphylococcus aureus NEGATIVE NEGATIVE Final    Comment: (NOTE) The Xpert SA Assay (FDA approved for NASAL specimens in patients 24 years of age and older), is one component of a comprehensive surveillance program. It is not intended to diagnose infection nor to guide or monitor treatment. Performed at [facility], [address], [place], [place] [address]      Radiology Studies: VAS US  DOPPLER PRE CABG Result Date: 05/27/2024 PREOPERATIVE VASCULAR EVALUATION Patient Name:  [name].  Date of Exam:   05/27/2024 Medical Rec #: [id number]              Accession #:    [id number] Date of Birth: 17-Jan-1951               Patient Gender: M Patient Age:   73 years Exam Location:  [facility] Procedure:      VAS US  DOPPLER PRE CABG Referring Phys: ELSPETH HENDRICKSON  --------------------------------------------------------------------------------  Indications:  Pre-CABG. Risk Factors: Hyperlipidemia. Performing Technologist: Jimmye Scarce RVT  Examination Guidelines: A complete evaluation includes B-mode imaging, spectral Doppler, color Doppler, and power Doppler as needed of all accessible portions of each vessel. Bilateral testing is considered an integral part of a complete examination. Limited examinations for reoccurring indications may be performed as noted.  Right Carotid Findings: +----------+--------+--------+--------+--------+------------------+           PSV cm/sEDV cm/sStenosisDescribeComments           +----------+--------+--------+--------+--------+------------------+ CCA Prox  93      20                                         +----------+--------+--------+--------+--------+------------------+ CCA Distal95      22  intimal thickening +----------+--------+--------+--------+--------+------------------+ ICA Prox  72      18      1-39%                              +----------+--------+--------+--------+--------+------------------+ ICA Distal58      21                                         +----------+--------+--------+--------+--------+------------------+ ECA       77                                                 +----------+--------+--------+--------+--------+------------------+ +----------+--------+-------+----------------+------------+           PSV cm/sEDV cmsDescribe        Arm Pressure +----------+--------+-------+----------------+------------+ Subclavian114            Multiphasic, WNL             +----------+--------+-------+----------------+------------+ +---------+--------+--+--------+-+---------+ VertebralPSV cm/s28EDV cm/s8Antegrade +---------+--------+--+--------+-+---------+ Left Carotid Findings: +----------+--------+--------+--------+--------+------------------+            PSV cm/sEDV cm/sStenosisDescribeComments           +----------+--------+--------+--------+--------+------------------+ CCA Prox  87      16                                         +----------+--------+--------+--------+--------+------------------+ CCA Distal80      19                      intimal thickening +----------+--------+--------+--------+--------+------------------+ ICA Prox  52      17      1-39%                              +----------+--------+--------+--------+--------+------------------+ ICA Distal56      19                                         +----------+--------+--------+--------+--------+------------------+ ECA       79                                                 +----------+--------+--------+--------+--------+------------------+ +----------+--------+--------+----------------+------------+ SubclavianPSV cm/sEDV cm/sDescribe        Arm Pressure +----------+--------+--------+----------------+------------+           103             Multiphasic, WNL             +----------+--------+--------+----------------+------------+ +---------+--------+--+--------+-+---------+ VertebralPSV cm/s45EDV cm/s9Antegrade +---------+--------+--+--------+-+---------+  ABI Findings: +------------------+-----+---------+ Rt Pressure (mmHg)IndexWaveform  +------------------+-----+---------+ 112                    triphasic +------------------+-----+---------+ 166               1.48 triphasic +------------------+-----+---------+ 151               1.35 triphasic +------------------+-----+---------+ +------------------+-----+---------+-------+  Lt Pressure (mmHg)IndexWaveform Comment +------------------+-----+---------+-------+                        triphasicIV      +------------------+-----+---------+-------+ 149               1.33 triphasic        +------------------+-----+---------+-------+ 146               1.30 triphasic         +------------------+-----+---------+-------+ +-------+---------------+ ABI/TBIToday's ABI/TBI +-------+---------------+ Right  1.48/1.04       +-------+---------------+ Left   1.33/1.06       +-------+---------------+  Right Doppler Findings: +--------+--------+---------+ Site    PressureDoppler   +--------+--------+---------+ [id number]     triphasic +--------+--------+---------+ Radial          triphasic +--------+--------+---------+ Ulnar           biphasic  +--------+--------+---------+  Left Doppler Findings: +--------+---------+--------+ Site    Doppler  Comments +--------+---------+--------+ BrachialtriphasicIV       +--------+---------+--------+ Radial  triphasic         +--------+---------+--------+ Ulnar   biphasic          +--------+---------+--------+   Summary: Right Carotid: Velocities in the right ICA are consistent with a 1-39% stenosis. Left Carotid: Velocities in the left ICA are consistent with a 1-39% stenosis. Vertebrals:  Bilateral vertebral arteries demonstrate antegrade flow. Subclavians: Normal flow hemodynamics were seen in bilateral subclavian              arteries. Right ABI: The right toe-brachial index is normal. Falsely elevated ABI. Left ABI: Falsely elevated ABI. Right Upper Extremity: Doppler waveform obliterate with right radial compression. Doppler waveforms remain within normal limits with right ulnar compression. Left Upper Extremity: Doppler waveform obliterate with left radial compression. Doppler waveforms remain within normal limits with left ulnar compression.  Electronically signed by Gaile New MD on 05/27/2024 at 5:11:16 PM.    Final      Nilda Fendt, MD, PhD [facility]  Between 7 am - 7 pm I am available, please contact me via Amion (for emergencies) or Securechat (non urgent messages)  Between 7 pm - 7 am I am not available, please contact night coverage MD/APP via Amion

## 2024-05-28 NOTE — Progress Notes (Signed)
 Mobility Specialist Progress Note:    05/28/24 1451  Mobility  Activity Ambulated with assistance in hallway  Level of Assistance Standby assist, set-up cues, supervision of patient - no hands on  Assistive Device Other (Comment) (IV Pole)  Distance Ambulated (ft) 500 ft  Activity Response Tolerated well  Mobility Referral Yes  Mobility visit 1 Mobility  Mobility Specialist Start Time (ACUTE ONLY) 1403  Mobility Specialist Stop Time (ACUTE ONLY) 1412  Mobility Specialist Time Calculation (min) (ACUTE ONLY) 9 min   Received pt in chair and eager for mobility. No physical assistance needed. No c/o throughout. Returned pt to room without fault. Left in chair with personal belongings and call light within reach. All needs met.  Lavanda Pollack Mobility Specialist  Please contact via [employer] or  Rehab Office [phone]

## 2024-05-28 NOTE — Progress Notes (Addendum)
 PHARMACY - ANTICOAGULATION CONSULT NOTE  Pharmacy Consult for heparin  Indication: chest pain/ACS  Allergies  Allergen Reactions   Codeine Itching   Morphine  And Codeine Other (See Comments)    Doesn't work   Valium [Diazepam] Other (See Comments)    Pt states it jacks him up    Patient Measurements: Height: 5' 9 (175.3 cm) Weight: 88.3 kg (194 lb 10.7 oz) IBW/kg (Calculated) : 70.7 HEPARIN  DW (KG): 88.6  Vital Signs: Temp: 98.2 F (36.8 C) (07/17 1100) Temp Source: Oral (07/17 1100) BP: 139/92 (07/17 1100) Pulse Rate: 72 (07/17 1100)  Labs: Recent Labs    05/25/24 1207 05/25/24 2210 05/26/24 0818 05/26/24 0915 05/27/24 0232 05/28/24 0217 05/28/24 1026  HGB 16.1  --  16.4  --  15.6 15.1  --   HCT 47.8  --  48.6  --  46.4 44.9  --   PLT 213  --  196  --  211 205  --   HEPARINUNFRC  --    < >  --    < > 0.26* 0.27* 0.15*  CREATININE 0.89  --   --   --   --   --   --    < > = values in this interval not displayed.    Estimated Creatinine Clearance: 83.7 mL/min (by C-G formula based on SCr of 0.89 mg/dL).    Assessment: 73 yo M presented to the ED with CP. Started on IV heparin . CBC is WNL and he is not on anticoagulation PTA.    Heparin  level 0.15 is subtherapeutic on 1300 units/hr.   Plans for CABG 7/18   Goal of Therapy:  Heparin  level 0.3-0.7 units/ml Monitor platelets by anticoagulation protocol: Yes   Plan:  Increase heparin  to 1500 units/hr F/u 8hr heparin  level  Prentice Poisson, PharmD Clinical Pharmacist **Pharmacist phone directory can now be found on [email or web address] ([email or web address]).  Listed under [facility].

## 2024-05-28 NOTE — Progress Notes (Signed)
  Progress Note  Patient Name: [name]. Date of Encounter: 05/28/2024 [facility] Cardiologist: Jaquari Reckner [place], MD new  Interval Summary   No chest pain overnight. Feels well.  Vital Signs Vitals:   05/27/24 2326 05/28/24 0406 05/28/24 0545 05/28/24 0855  BP: 114/60 106/77  123/80  Pulse:      Resp: 18 18  18   Temp: (!) 97.5 F (36.4 C) (!) 97.5 F (36.4 C)  98.2 F (36.8 C)  TempSrc: Oral Oral  Oral  SpO2: 96% 95%  95%  Weight:   88.3 kg   Height:        Intake/Output Summary (Last 24 hours) at 05/28/2024 1001 Last data filed at 05/28/2024 0843 Gross per 24 hour  Intake 554.9 ml  Output 1175 ml  Net -620.1 ml      05/28/2024    5:45 AM 05/27/2024    4:13 AM 05/26/2024    2:15 PM  Last 3 Weights  Weight (lbs) 194 lb 10.7 oz 193 lb 6.4 oz 196 lb 10.4 oz  Weight (kg) 88.3 kg 87.726 kg 89.2 kg      Telemetry/ECG  NSR - Personally Reviewed  Physical Exam  GEN: No acute distress.   Neck: No JVD Cardiac: RRR, no murmurs, rubs, or gallops.  Respiratory: Clear to auscultation bilaterally. GI: Soft, nontender, non-distended  MS: No edema, radial site OK  Assessment & Plan  NSTEMI. Troponins mildly elevated with flat trend. Subtle ST changes inferiorly. On IV heparin . Cardiac cath with left main and ostial LCx disease. Needs CABG. Plan tomorrow. Dopplers OK HLD LDL 100. On lipitor  80 mg. Will add Zetia .    For questions or updates, please contact [facility] Please consult [email or web address] for contact info under       Signed, Dawanda Mapel [place], MD

## 2024-05-28 NOTE — Progress Notes (Signed)
 PHARMACY - ANTICOAGULATION CONSULT NOTE  Pharmacy Consult for heparin  Indication: chest pain/ACS  Allergies  Allergen Reactions   Codeine Itching   Morphine  And Codeine Other (See Comments)    Doesn't work   Valium [Diazepam] Other (See Comments)    Pt states it jacks him up    Patient Measurements: Height: 5' 9 (175.3 cm) Weight: 88.3 kg (194 lb 10.7 oz) IBW/kg (Calculated) : 70.7 HEPARIN  DW (KG): 88.6  Vital Signs: Temp: 98.3 F (36.8 C) (07/17 1931) Temp Source: Oral (07/17 1931) BP: 156/91 (07/17 1931) Pulse Rate: 78 (07/17 1931)  Labs: Recent Labs    05/26/24 0818 05/26/24 0915 05/27/24 0232 05/28/24 0217 05/28/24 1026 05/28/24 2017  HGB 16.4  --  15.6 15.1  --   --   HCT 48.6  --  46.4 44.9  --   --   PLT 196  --  211 205  --   --   HEPARINUNFRC  --    < > 0.26* 0.27* 0.15* 0.49   < > = values in this interval not displayed.    Estimated Creatinine Clearance: 83.7 mL/min (by C-G formula based on SCr of 0.89 mg/dL).    Assessment: 73 yo M presented to the ED with CP. Started on IV heparin . CBC is WNL and he is not on anticoagulation PTA.    Heparin  level 0.15 is subtherapeutic on 1300 units/hr.   Plans for CABG 7/18  PM follow up: heparin  level therapeutic at 0.49 while running at 1500 units/hr  Goal of Therapy:  Heparin  level 0.3-0.7 units/ml Monitor platelets by anticoagulation protocol: Yes   Plan:  Continue current heparin  rate of 1500 units/hr Daily heparin  level Monitor CBC, s/sx bleeding  Randall Call, PharmD, BCPS 05/28/24 9:05 PM

## 2024-05-28 NOTE — Plan of Care (Signed)

## 2024-05-28 NOTE — Progress Notes (Signed)
 PHARMACY - ANTICOAGULATION CONSULT NOTE  Pharmacy Consult for heparin  Indication: CAD awaiting CABG  Labs: Recent Labs    05/25/24 1207 05/25/24 2210 05/26/24 0818 05/26/24 0915 05/27/24 0232 05/28/24 0217  HGB 16.1  --  16.4  --  15.6 15.1  HCT 47.8  --  48.6  --  46.4 44.9  PLT 213  --  196  --  211 205  HEPARINUNFRC  --    < >  --  0.34 0.26* 0.27*  CREATININE 0.89  --   --   --   --   --    < > = values in this interval not displayed.   Assessment: 73yo male subtherapeutic on heparin  after resumed post-cath; no infusion issues or signs of bleeding per RN.  Goal of Therapy:  Heparin  level 0.3-0.7 units/ml   Plan:  Increase heparin  infusion slightly to 1300 units/hr. Check level in 6 hours.   Marvetta Dauphin, PharmD, BCPS 05/28/2024 3:28 AM

## 2024-05-28 NOTE — Progress Notes (Signed)
 Discussed with pt IS (2500 ml), sternal precautions, mobility post op, and d/c planning. Pt receptive. Gave [facility] booklet. His wife or someone else can be with him at d/c. He does voice that he is having left knee pain. I can't pick up my leg. Noted redness at lower patella and painful to touch. Notified RN. Pt sts moving around the room has been difficult. [id number] Aliene Aris BS, ACSM-CEP 05/28/2024 10:56 AM

## 2024-05-29 ENCOUNTER — Inpatient Hospital Stay (HOSPITAL_COMMUNITY): Payer: Self-pay | Admitting: Anesthesiology

## 2024-05-29 ENCOUNTER — Encounter (HOSPITAL_COMMUNITY)
Admission: EM | Disposition: A | Discharge status: Home / Self Care | Payer: Self-pay | Source: Home / Self Care | Attending: Thoracic Surgery (Cardiothoracic Vascular Surgery)

## 2024-05-29 ENCOUNTER — Encounter (HOSPITAL_COMMUNITY): Payer: Self-pay | Admitting: Internal Medicine

## 2024-05-29 ENCOUNTER — Other Ambulatory Visit: Payer: Self-pay

## 2024-05-29 ENCOUNTER — Inpatient Hospital Stay (HOSPITAL_COMMUNITY)

## 2024-05-29 DIAGNOSIS — I252 Old myocardial infarction: Secondary | ICD-10-CM

## 2024-05-29 DIAGNOSIS — Z951 Presence of aortocoronary bypass graft: Secondary | ICD-10-CM

## 2024-05-29 DIAGNOSIS — I214 Non-ST elevation (NSTEMI) myocardial infarction: Secondary | ICD-10-CM

## 2024-05-29 DIAGNOSIS — I2510 Atherosclerotic heart disease of native coronary artery without angina pectoris: Secondary | ICD-10-CM

## 2024-05-29 DIAGNOSIS — E785 Hyperlipidemia, unspecified: Secondary | ICD-10-CM | POA: Diagnosis not present

## 2024-05-29 HISTORY — PX: CORONARY ARTERY BYPASS GRAFT: SHX141

## 2024-05-29 HISTORY — PX: TEE WITHOUT CARDIOVERSION: SHX5443

## 2024-05-29 LAB — POCT I-STAT 7, (LYTES, BLD GAS, ICA,H+H)
Acid-base deficit: 2 mmol/L (ref 0.0–2.0)
Acid-base deficit: 4 mmol/L — ABNORMAL HIGH (ref 0.0–2.0)
Acid-base deficit: 5 mmol/L — ABNORMAL HIGH (ref 0.0–2.0)
Acid-base deficit: 4 mmol/L — ABNORMAL HIGH (ref 0.0–2.0)
Acid-base deficit: 3 mmol/L — ABNORMAL HIGH (ref 0.0–2.0)
Bicarbonate: 22.8 mmol/L (ref 20.0–28.0)
Bicarbonate: 20.8 mmol/L (ref 20.0–28.0)
Bicarbonate: 22 mmol/L (ref 20.0–28.0)
Bicarbonate: 21.4 mmol/L (ref 20.0–28.0)
Bicarbonate: 22.5 mmol/L (ref 20.0–28.0)
Calcium, Ion: 1.04 mmol/L — ABNORMAL LOW (ref 1.15–1.40)
Calcium, Ion: 1 mmol/L — ABNORMAL LOW (ref 1.15–1.40)
Calcium, Ion: 1.09 mmol/L — ABNORMAL LOW (ref 1.15–1.40)
Calcium, Ion: 1.13 mmol/L — ABNORMAL LOW (ref 1.15–1.40)
Calcium, Ion: 1.13 mmol/L — ABNORMAL LOW (ref 1.15–1.40)
HCT: 32 % — ABNORMAL LOW (ref 39.0–52.0)
HCT: 28 % — ABNORMAL LOW (ref 39.0–52.0)
HCT: 40 % (ref 39.0–52.0)
HCT: 41 % (ref 39.0–52.0)
HCT: 42 % (ref 39.0–52.0)
Hemoglobin: 10.9 g/dL — ABNORMAL LOW (ref 13.0–17.0)
Hemoglobin: 9.5 g/dL — ABNORMAL LOW (ref 13.0–17.0)
Hemoglobin: 13.6 g/dL (ref 13.0–17.0)
Hemoglobin: 13.9 g/dL (ref 13.0–17.0)
Hemoglobin: 14.3 g/dL (ref 13.0–17.0)
O2 Saturation: 100 %
O2 Saturation: 100 %
O2 Saturation: 97 %
O2 Saturation: 98 %
O2 Saturation: 97 %
Patient temperature: 36.4
Patient temperature: 37.3
Patient temperature: 37.4
Potassium: 4.9 mmol/L (ref 3.5–5.1)
Potassium: 4.7 mmol/L (ref 3.5–5.1)
Potassium: 4.4 mmol/L (ref 3.5–5.1)
Potassium: 4.7 mmol/L (ref 3.5–5.1)
Potassium: 4.7 mmol/L (ref 3.5–5.1)
Sodium: 136 mmol/L (ref 135–145)
Sodium: 137 mmol/L (ref 135–145)
Sodium: 137 mmol/L (ref 135–145)
Sodium: 137 mmol/L (ref 135–145)
Sodium: 137 mmol/L (ref 135–145)
TCO2: 24 mmol/L (ref 22–32)
TCO2: 22 mmol/L (ref 22–32)
TCO2: 23 mmol/L (ref 22–32)
TCO2: 23 mmol/L (ref 22–32)
TCO2: 24 mmol/L (ref 22–32)
pCO2 arterial: 37.3 mmHg (ref 32–48)
pCO2 arterial: 35.2 mmHg (ref 32–48)
pCO2 arterial: 44.2 mmHg (ref 32–48)
pCO2 arterial: 41.6 mmHg (ref 32–48)
pCO2 arterial: 40.7 mmHg (ref 32–48)
pH, Arterial: 7.394 (ref 7.35–7.45)
pH, Arterial: 7.38 (ref 7.35–7.45)
pH, Arterial: 7.302 — ABNORMAL LOW (ref 7.35–7.45)
pH, Arterial: 7.32 — ABNORMAL LOW (ref 7.35–7.45)
pH, Arterial: 7.352 (ref 7.35–7.45)
pO2, Arterial: 369 mmHg — ABNORMAL HIGH (ref 83–108)
pO2, Arterial: 251 mmHg — ABNORMAL HIGH (ref 83–108)
pO2, Arterial: 93 mmHg (ref 83–108)
pO2, Arterial: 108 mmHg (ref 83–108)
pO2, Arterial: 96 mmHg (ref 83–108)

## 2024-05-29 LAB — HEPARIN LEVEL (UNFRACTIONATED): Heparin Unfractionated: 0.54 [IU]/mL (ref 0.30–0.70)

## 2024-05-29 LAB — POCT I-STAT, CHEM 8
BUN: 12 mg/dL (ref 8–23)
BUN: 11 mg/dL (ref 8–23)
BUN: 11 mg/dL (ref 8–23)
BUN: 10 mg/dL (ref 8–23)
BUN: 9 mg/dL (ref 8–23)
Calcium, Ion: 1.22 mmol/L (ref 1.15–1.40)
Calcium, Ion: 1.22 mmol/L (ref 1.15–1.40)
Calcium, Ion: 1.08 mmol/L — ABNORMAL LOW (ref 1.15–1.40)
Calcium, Ion: 0.98 mmol/L — ABNORMAL LOW (ref 1.15–1.40)
Calcium, Ion: 1.08 mmol/L — ABNORMAL LOW (ref 1.15–1.40)
Chloride: 101 mmol/L (ref 98–111)
Chloride: 101 mmol/L (ref 98–111)
Chloride: 101 mmol/L (ref 98–111)
Chloride: 101 mmol/L (ref 98–111)
Chloride: 103 mmol/L (ref 98–111)
Creatinine, Ser: 0.9 mg/dL (ref 0.61–1.24)
Creatinine, Ser: 0.8 mg/dL (ref 0.61–1.24)
Creatinine, Ser: 0.8 mg/dL (ref 0.61–1.24)
Creatinine, Ser: 0.7 mg/dL (ref 0.61–1.24)
Creatinine, Ser: 0.8 mg/dL (ref 0.61–1.24)
Glucose, Bld: 126 mg/dL — ABNORMAL HIGH (ref 70–99)
Glucose, Bld: 126 mg/dL — ABNORMAL HIGH (ref 70–99)
Glucose, Bld: 129 mg/dL — ABNORMAL HIGH (ref 70–99)
Glucose, Bld: 140 mg/dL — ABNORMAL HIGH (ref 70–99)
Glucose, Bld: 156 mg/dL — ABNORMAL HIGH (ref 70–99)
HCT: 42 % (ref 39.0–52.0)
HCT: 41 % (ref 39.0–52.0)
HCT: 33 % — ABNORMAL LOW (ref 39.0–52.0)
HCT: 33 % — ABNORMAL LOW (ref 39.0–52.0)
HCT: 33 % — ABNORMAL LOW (ref 39.0–52.0)
Hemoglobin: 14.3 g/dL (ref 13.0–17.0)
Hemoglobin: 13.9 g/dL (ref 13.0–17.0)
Hemoglobin: 11.2 g/dL — ABNORMAL LOW (ref 13.0–17.0)
Hemoglobin: 11.2 g/dL — ABNORMAL LOW (ref 13.0–17.0)
Hemoglobin: 11.2 g/dL — ABNORMAL LOW (ref 13.0–17.0)
Potassium: 4.3 mmol/L (ref 3.5–5.1)
Potassium: 4.6 mmol/L (ref 3.5–5.1)
Potassium: 6 mmol/L — ABNORMAL HIGH (ref 3.5–5.1)
Potassium: 4.6 mmol/L (ref 3.5–5.1)
Potassium: 4.8 mmol/L (ref 3.5–5.1)
Sodium: 136 mmol/L (ref 135–145)
Sodium: 137 mmol/L (ref 135–145)
Sodium: 134 mmol/L — ABNORMAL LOW (ref 135–145)
Sodium: 135 mmol/L (ref 135–145)
Sodium: 136 mmol/L (ref 135–145)
TCO2: 24 mmol/L (ref 22–32)
TCO2: 24 mmol/L (ref 22–32)
TCO2: 24 mmol/L (ref 22–32)
TCO2: 21 mmol/L — ABNORMAL LOW (ref 22–32)
TCO2: 22 mmol/L (ref 22–32)

## 2024-05-29 LAB — BLOOD GAS, ARTERIAL
Acid-base deficit: 0.7 mmol/L (ref 0.0–2.0)
Bicarbonate: 24.2 mmol/L (ref 20.0–28.0)
O2 Saturation: 95.8 %
Patient temperature: 36.4
pCO2 arterial: 39 mmHg (ref 32–48)
pH, Arterial: 7.4 (ref 7.35–7.45)
pO2, Arterial: 75 mmHg — ABNORMAL LOW (ref 83–108)

## 2024-05-29 LAB — CBC
HCT: 47.1 % (ref 39.0–52.0)
HCT: 50.7 % (ref 39.0–52.0)
HCT: 41.6 % (ref 39.0–52.0)
HCT: 42.8 % (ref 39.0–52.0)
Hemoglobin: 15.7 g/dL (ref 13.0–17.0)
Hemoglobin: 16.8 g/dL (ref 13.0–17.0)
Hemoglobin: 14.1 g/dL (ref 13.0–17.0)
Hemoglobin: 14.6 g/dL (ref 13.0–17.0)
MCH: 29.1 pg (ref 26.0–34.0)
MCH: 29.3 pg (ref 26.0–34.0)
MCH: 30.1 pg (ref 26.0–34.0)
MCH: 30.2 pg (ref 26.0–34.0)
MCHC: 33.3 g/dL (ref 30.0–36.0)
MCHC: 33.1 g/dL (ref 30.0–36.0)
MCHC: 33.9 g/dL (ref 30.0–36.0)
MCHC: 34.1 g/dL (ref 30.0–36.0)
MCV: 87.4 fL (ref 80.0–100.0)
MCV: 88.5 fL (ref 80.0–100.0)
MCV: 88.7 fL (ref 80.0–100.0)
MCV: 88.4 fL (ref 80.0–100.0)
Platelets: 217 K/uL (ref 150–400)
Platelets: 207 K/uL (ref 150–400)
Platelets: 134 K/uL — ABNORMAL LOW (ref 150–400)
Platelets: 167 K/uL (ref 150–400)
RBC: 5.39 MIL/uL (ref 4.22–5.81)
RBC: 5.73 MIL/uL (ref 4.22–5.81)
RBC: 4.69 MIL/uL (ref 4.22–5.81)
RBC: 4.84 MIL/uL (ref 4.22–5.81)
RDW: 13.7 % (ref 11.5–15.5)
RDW: 13.6 % (ref 11.5–15.5)
RDW: 13.3 % (ref 11.5–15.5)
RDW: 13.5 % (ref 11.5–15.5)
WBC: 10.4 K/uL (ref 4.0–10.5)
WBC: 11.3 K/uL — ABNORMAL HIGH (ref 4.0–10.5)
WBC: 23.4 K/uL — ABNORMAL HIGH (ref 4.0–10.5)
WBC: 22.5 K/uL — ABNORMAL HIGH (ref 4.0–10.5)
nRBC: 0 % (ref 0.0–0.2)
nRBC: 0 % (ref 0.0–0.2)
nRBC: 0 % (ref 0.0–0.2)
nRBC: 0 % (ref 0.0–0.2)

## 2024-05-29 LAB — COMPREHENSIVE METABOLIC PANEL WITH GFR
ALT: 41 U/L (ref 0–44)
AST: 33 U/L (ref 15–41)
Albumin: 4.1 g/dL (ref 3.5–5.0)
Alkaline Phosphatase: 63 U/L (ref 38–126)
Anion gap: 12 (ref 5–15)
BUN: 12 mg/dL (ref 8–23)
CO2: 23 mmol/L (ref 22–32)
Calcium: 9.5 mg/dL (ref 8.9–10.3)
Chloride: 102 mmol/L (ref 98–111)
Creatinine, Ser: 0.96 mg/dL (ref 0.61–1.24)
GFR, Estimated: >60 mL/min (ref >60)
Glucose, Bld: 111 mg/dL — ABNORMAL HIGH (ref 70–99)
Potassium: 4.2 mmol/L (ref 3.5–5.1)
Sodium: 137 mmol/L (ref 135–145)
Total Bilirubin: 0.9 mg/dL (ref 0.0–1.2)
Total Protein: 7.3 g/dL (ref 6.5–8.1)

## 2024-05-29 LAB — BASIC METABOLIC PANEL WITH GFR
Anion gap: 12 (ref 5–15)
Anion gap: 9 (ref 5–15)
BUN: 10 mg/dL (ref 8–23)
BUN: 14 mg/dL (ref 8–23)
CO2: 20 mmol/L — ABNORMAL LOW (ref 22–32)
CO2: 20 mmol/L — ABNORMAL LOW (ref 22–32)
Calcium: 7.8 mg/dL — ABNORMAL LOW (ref 8.9–10.3)
Calcium: 9.7 mg/dL (ref 8.9–10.3)
Chloride: 104 mmol/L (ref 98–111)
Chloride: 106 mmol/L (ref 98–111)
Creatinine, Ser: 0.98 mg/dL (ref 0.61–1.24)
Creatinine, Ser: 0.98 mg/dL (ref 0.61–1.24)
GFR, Estimated: >60 mL/min (ref >60)
GFR, Estimated: >60 mL/min (ref >60)
Glucose, Bld: 104 mg/dL — ABNORMAL HIGH (ref 70–99)
Glucose, Bld: 128 mg/dL — ABNORMAL HIGH (ref 70–99)
Potassium: 4.4 mmol/L (ref 3.5–5.1)
Potassium: 4.6 mmol/L (ref 3.5–5.1)
Sodium: 135 mmol/L (ref 135–145)
Sodium: 136 mmol/L (ref 135–145)

## 2024-05-29 LAB — URINALYSIS, ROUTINE W REFLEX MICROSCOPIC
Bilirubin Urine: NEGATIVE
Glucose, UA: NEGATIVE mg/dL
Hgb urine dipstick: NEGATIVE
Ketones, ur: NEGATIVE mg/dL
Leukocytes,Ua: NEGATIVE
Nitrite: NEGATIVE
Protein, ur: NEGATIVE mg/dL
Specific Gravity, Urine: 1.011 (ref 1.005–1.030)
pH: 5 (ref 5.0–8.0)

## 2024-05-29 LAB — POCT I-STAT EG7
Acid-Base Excess: 0 mmol/L (ref 0.0–2.0)
Bicarbonate: 25 mmol/L (ref 20.0–28.0)
Calcium, Ion: 1.07 mmol/L — ABNORMAL LOW (ref 1.15–1.40)
HCT: 33 % — ABNORMAL LOW (ref 39.0–52.0)
Hemoglobin: 11.2 g/dL — ABNORMAL LOW (ref 13.0–17.0)
O2 Saturation: 81 %
Potassium: 4.7 mmol/L (ref 3.5–5.1)
Sodium: 137 mmol/L (ref 135–145)
TCO2: 26 mmol/L (ref 22–32)
pCO2, Ven: 43.7 mmHg — ABNORMAL LOW (ref 44–60)
pH, Ven: 7.366 (ref 7.25–7.43)
pO2, Ven: 47 mmHg — ABNORMAL HIGH (ref 32–45)

## 2024-05-29 LAB — PROTIME-INR
INR: 1 (ref 0.8–1.2)
INR: 1.4 — ABNORMAL HIGH (ref 0.8–1.2)
Prothrombin Time: 13.3 s (ref 11.4–15.2)
Prothrombin Time: 17.8 s — ABNORMAL HIGH (ref 11.4–15.2)

## 2024-05-29 LAB — MAGNESIUM: Magnesium: 3.2 mg/dL — ABNORMAL HIGH (ref 1.7–2.4)

## 2024-05-29 LAB — GLUCOSE, CAPILLARY
Glucose-Capillary: 117 mg/dL — ABNORMAL HIGH (ref 70–99)
Glucose-Capillary: 121 mg/dL — ABNORMAL HIGH (ref 70–99)
Glucose-Capillary: 131 mg/dL — ABNORMAL HIGH (ref 70–99)
Glucose-Capillary: 134 mg/dL — ABNORMAL HIGH (ref 70–99)
Glucose-Capillary: 134 mg/dL — ABNORMAL HIGH (ref 70–99)
Glucose-Capillary: 134 mg/dL — ABNORMAL HIGH (ref 70–99)
Glucose-Capillary: 139 mg/dL — ABNORMAL HIGH (ref 70–99)
Glucose-Capillary: 144 mg/dL — ABNORMAL HIGH (ref 70–99)
Glucose-Capillary: 157 mg/dL — ABNORMAL HIGH (ref 70–99)

## 2024-05-29 LAB — HEMOGLOBIN A1C
Hgb A1c MFr Bld: 5.7 % — ABNORMAL HIGH (ref 4.8–5.6)
Mean Plasma Glucose: 116.89 mg/dL

## 2024-05-29 LAB — APTT: aPTT: 30 s (ref 24–36)

## 2024-05-29 LAB — HEMOGLOBIN AND HEMATOCRIT, BLOOD
HCT: 34.1 % — ABNORMAL LOW (ref 39.0–52.0)
Hemoglobin: 11.8 g/dL — ABNORMAL LOW (ref 13.0–17.0)

## 2024-05-29 LAB — PLATELET COUNT: Platelets: 184 K/uL (ref 150–400)

## 2024-05-29 SURGERY — CORONARY ARTERY BYPASS GRAFTING (CABG)
Anesthesia: General | Site: Chest

## 2024-05-29 MED ORDER — 0.9 % SODIUM CHLORIDE (POUR BTL) OPTIME
TOPICAL | Status: DC | PRN
  Administered 2024-05-29: 5000 mL

## 2024-05-29 MED ORDER — SODIUM CHLORIDE 0.9 % IV SOLN: INTRAVENOUS | Status: DC | PRN

## 2024-05-29 MED ORDER — SODIUM CHLORIDE 0.45 % IV SOLN: INTRAVENOUS | Status: AC | PRN

## 2024-05-29 MED ORDER — METOPROLOL TARTRATE 12.5 MG HALF TABLET: 12.5000 mg | ORAL_TABLET | Freq: Two times a day (BID) | ORAL | Status: DC

## 2024-05-29 MED ORDER — PROTAMINE SULFATE 10 MG/ML IV SOLN
INTRAVENOUS | Status: AC
  Filled 2024-05-29: qty 25

## 2024-05-29 MED ORDER — LACTATED RINGERS IV SOLN: INTRAVENOUS | Status: AC

## 2024-05-29 MED ORDER — MAGNESIUM SULFATE 4 GM/100ML IV SOLN
4.0000 g | Freq: Once | INTRAVENOUS | Status: AC
  Administered 2024-05-29: 4 g via INTRAVENOUS
  Filled 2024-05-29: qty 100

## 2024-05-29 MED ORDER — EPHEDRINE SULFATE-NACL 50-0.9 MG/10ML-% IV SOSY
PREFILLED_SYRINGE | INTRAVENOUS | Status: DC | PRN
  Administered 2024-05-29: 10 mg via INTRAVENOUS

## 2024-05-29 MED ORDER — DOCUSATE SODIUM 100 MG PO CAPS
200.0000 mg | ORAL_CAPSULE | Freq: Every day | ORAL | Status: DC
  Administered 2024-05-30 – 2024-05-31 (×2): 200 mg via ORAL
  Filled 2024-05-29 (×3): qty 2

## 2024-05-29 MED ORDER — CEFAZOLIN SODIUM-DEXTROSE 2-4 GM/100ML-% IV SOLN
2.0000 g | Freq: Three times a day (TID) | INTRAVENOUS | Status: AC
  Administered 2024-05-29 – 2024-05-31 (×6): 2 g via INTRAVENOUS
  Filled 2024-05-29 (×6): qty 100

## 2024-05-29 MED ORDER — PROTAMINE SULFATE 10 MG/ML IV SOLN
INTRAVENOUS | Status: DC | PRN
  Administered 2024-05-29: 290 mg via INTRAVENOUS
  Administered 2024-05-29: 50 mg via INTRAVENOUS

## 2024-05-29 MED ORDER — PANTOPRAZOLE SODIUM 40 MG IV SOLR
40.0000 mg | Freq: Every day | INTRAVENOUS | Status: AC
  Administered 2024-05-29 – 2024-05-30 (×2): 40 mg via INTRAVENOUS
  Filled 2024-05-29 (×2): qty 10

## 2024-05-29 MED ORDER — HEPARIN SODIUM (PORCINE) 1000 UNIT/ML IJ SOLN
INTRAMUSCULAR | Status: DC | PRN
  Administered 2024-05-29: 2000 [IU] via INTRAVENOUS
  Administered 2024-05-29: 27000 [IU] via INTRAVENOUS

## 2024-05-29 MED ORDER — SODIUM CHLORIDE 0.9 % IV SOLN: 250.0000 mL | INTRAVENOUS | Status: AC

## 2024-05-29 MED ORDER — PANTOPRAZOLE SODIUM 40 MG PO TBEC
40.0000 mg | DELAYED_RELEASE_TABLET | Freq: Every day | ORAL | Status: DC
  Administered 2024-05-31 – 2024-06-02 (×3): 40 mg via ORAL
  Filled 2024-05-29 (×3): qty 1

## 2024-05-29 MED ORDER — PHENYLEPHRINE 80 MCG/ML (10ML) SYRINGE FOR IV PUSH (FOR BLOOD PRESSURE SUPPORT)
PREFILLED_SYRINGE | INTRAVENOUS | Status: AC
  Filled 2024-05-29: qty 10

## 2024-05-29 MED ORDER — PROPOFOL 10 MG/ML IV BOLUS
INTRAVENOUS | Status: AC
  Filled 2024-05-29: qty 20

## 2024-05-29 MED ORDER — ROCURONIUM BROMIDE 10 MG/ML (PF) SYRINGE
PREFILLED_SYRINGE | INTRAVENOUS | Status: AC
  Filled 2024-05-29: qty 20

## 2024-05-29 MED ORDER — ROCURONIUM BROMIDE 100 MG/10ML IV SOLN
INTRAVENOUS | Status: DC | PRN
  Administered 2024-05-29: 10 mg via INTRAVENOUS
  Administered 2024-05-29: 50 mg via INTRAVENOUS
  Administered 2024-05-29: 100 mg via INTRAVENOUS
  Administered 2024-05-29: 30 mg via INTRAVENOUS

## 2024-05-29 MED ORDER — PROTAMINE SULFATE 10 MG/ML IV SOLN
INTRAVENOUS | Status: AC
  Filled 2024-05-29: qty 5

## 2024-05-29 MED ORDER — ASPIRIN 325 MG PO TBEC
325.0000 mg | DELAYED_RELEASE_TABLET | Freq: Every day | ORAL | Status: DC
  Administered 2024-05-30 – 2024-06-01 (×3): 325 mg via ORAL
  Filled 2024-05-29 (×3): qty 1

## 2024-05-29 MED ORDER — NITROGLYCERIN IN D5W 200-5 MCG/ML-% IV SOLN: 0.0000 ug/min | INTRAVENOUS | Status: DC

## 2024-05-29 MED ORDER — SODIUM CHLORIDE (PF) 0.9 % IJ SOLN: OROMUCOSAL | Status: DC | PRN

## 2024-05-29 MED ORDER — POTASSIUM CHLORIDE 10 MEQ/50ML IV SOLN: 10.0000 meq | INTRAVENOUS | Status: AC

## 2024-05-29 MED ORDER — FENTANYL CITRATE (PF) 250 MCG/5ML IJ SOLN
INTRAMUSCULAR | Status: AC
  Filled 2024-05-29: qty 5

## 2024-05-29 MED ORDER — ALBUMIN HUMAN 5 % IV SOLN: INTRAVENOUS | Status: DC | PRN

## 2024-05-29 MED ORDER — MIDAZOLAM HCL (PF) 5 MG/ML IJ SOLN
INTRAMUSCULAR | Status: DC | PRN
  Administered 2024-05-29 (×2): 2 mg via INTRAVENOUS

## 2024-05-29 MED ORDER — SODIUM BICARBONATE 8.4 % IV SOLN
50.0000 meq | Freq: Once | INTRAVENOUS | Status: AC
  Administered 2024-05-29: 50 meq via INTRAVENOUS

## 2024-05-29 MED ORDER — FENTANYL CITRATE PF 50 MCG/ML IJ SOSY
25.0000 ug | PREFILLED_SYRINGE | INTRAMUSCULAR | Status: DC | PRN
  Administered 2024-05-29 (×3): 25 ug via INTRAVENOUS
  Filled 2024-05-29 (×3): qty 1

## 2024-05-29 MED ORDER — SODIUM CHLORIDE 0.9 % IV SOLN: INTRAVENOUS | Status: AC

## 2024-05-29 MED ORDER — ACETAMINOPHEN 160 MG/5ML PO SOLN: 1000.0000 mg | Freq: Four times a day (QID) | ORAL | Status: DC

## 2024-05-29 MED ORDER — SODIUM CHLORIDE 0.9% FLUSH
3.0000 mL | Freq: Two times a day (BID) | INTRAVENOUS | Status: DC
  Administered 2024-05-30 – 2024-05-31 (×3): 3 mL via INTRAVENOUS

## 2024-05-29 MED ORDER — METOCLOPRAMIDE HCL 5 MG/ML IJ SOLN
10.0000 mg | Freq: Four times a day (QID) | INTRAMUSCULAR | Status: AC
  Administered 2024-05-29 – 2024-05-30 (×6): 10 mg via INTRAVENOUS
  Filled 2024-05-29 (×6): qty 2

## 2024-05-29 MED ORDER — ASPIRIN 81 MG PO CHEW: 324.0000 mg | CHEWABLE_TABLET | Freq: Every day | ORAL | Status: DC

## 2024-05-29 MED ORDER — INSULIN REGULAR(HUMAN) IN NACL 100-0.9 UT/100ML-% IV SOLN: INTRAVENOUS | Status: DC

## 2024-05-29 MED ORDER — SODIUM CHLORIDE 0.9% FLUSH: 3.0000 mL | INTRAVENOUS | Status: DC | PRN

## 2024-05-29 MED ORDER — PROPOFOL 10 MG/ML IV BOLUS
INTRAVENOUS | Status: DC | PRN
  Administered 2024-05-29 (×2): 40 mg via INTRAVENOUS

## 2024-05-29 MED ORDER — OXYCODONE HCL 5 MG PO TABS
5.0000 mg | ORAL_TABLET | ORAL | Status: DC | PRN
  Administered 2024-05-29: 10 mg via ORAL
  Administered 2024-05-29: 5 mg via ORAL
  Administered 2024-05-30 (×2): 10 mg via ORAL
  Filled 2024-05-29: qty 2
  Filled 2024-05-29: qty 1
  Filled 2024-05-29 (×2): qty 2

## 2024-05-29 MED ORDER — ACETAMINOPHEN 160 MG/5ML PO SOLN
650.0000 mg | Freq: Once | ORAL | Status: AC
  Administered 2024-05-29: 650 mg
  Filled 2024-05-29: qty 20.3

## 2024-05-29 MED ORDER — CHLORHEXIDINE GLUCONATE CLOTH 2 % EX PADS
6.0000 | MEDICATED_PAD | Freq: Every day | CUTANEOUS | Status: DC
  Administered 2024-05-29 – 2024-06-02 (×5): 6 via TOPICAL

## 2024-05-29 MED ORDER — EPHEDRINE 5 MG/ML INJ
INTRAVENOUS | Status: AC
  Filled 2024-05-29: qty 5

## 2024-05-29 MED ORDER — VANCOMYCIN HCL IN DEXTROSE 1-5 GM/200ML-% IV SOLN
1000.0000 mg | Freq: Once | INTRAVENOUS | Status: AC
  Administered 2024-05-29: 1000 mg via INTRAVENOUS
  Filled 2024-05-29: qty 200

## 2024-05-29 MED ORDER — LIDOCAINE 2% (20 MG/ML) 5 ML SYRINGE
INTRAMUSCULAR | Status: AC
  Filled 2024-05-29: qty 5

## 2024-05-29 MED ORDER — PHENYLEPHRINE HCL (PRESSORS) 10 MG/ML IV SOLN
INTRAVENOUS | Status: DC | PRN
  Administered 2024-05-29: 80 ug via INTRAVENOUS

## 2024-05-29 MED ORDER — TRAMADOL HCL 50 MG PO TABS
50.0000 mg | ORAL_TABLET | ORAL | Status: DC | PRN
  Administered 2024-05-29 – 2024-05-31 (×3): 100 mg via ORAL
  Filled 2024-05-29 (×3): qty 2

## 2024-05-29 MED ORDER — METOPROLOL TARTRATE 25 MG/10 ML ORAL SUSPENSION: 12.5000 mg | Freq: Two times a day (BID) | ORAL | Status: DC

## 2024-05-29 MED ORDER — BISACODYL 5 MG PO TBEC: 5.0000 mg | DELAYED_RELEASE_TABLET | Freq: Once | ORAL | Status: DC

## 2024-05-29 MED ORDER — ACETAMINOPHEN 500 MG PO TABS
1000.0000 mg | ORAL_TABLET | Freq: Four times a day (QID) | ORAL | Status: DC
  Administered 2024-05-29 – 2024-06-01 (×10): 1000 mg via ORAL
  Filled 2024-05-29 (×12): qty 2

## 2024-05-29 MED ORDER — DEXTROSE 50 % IV SOLN: 0.0000 mL | INTRAVENOUS | Status: DC | PRN

## 2024-05-29 MED ORDER — MIDAZOLAM HCL (PF) 10 MG/2ML IJ SOLN
INTRAMUSCULAR | Status: AC
  Filled 2024-05-29: qty 2

## 2024-05-29 MED ORDER — ALBUMIN HUMAN 5 % IV SOLN
250.0000 mL | INTRAVENOUS | Status: DC | PRN
  Administered 2024-05-29 (×2): 12.5 g via INTRAVENOUS
  Filled 2024-05-29: qty 250

## 2024-05-29 MED ORDER — HEMOSTATIC AGENTS (NO CHARGE) OPTIME
TOPICAL | Status: DC | PRN
  Administered 2024-05-29: 1 via TOPICAL

## 2024-05-29 MED ORDER — MIDAZOLAM HCL 2 MG/2ML IJ SOLN
INTRAMUSCULAR | Status: AC
  Filled 2024-05-29: qty 2

## 2024-05-29 MED ORDER — LACTATED RINGERS IV SOLN
INTRAVENOUS | Status: DC | PRN
Start: 2024-05-29 — End: 2024-05-29

## 2024-05-29 MED ORDER — LACTATED RINGERS IV SOLN: INTRAVENOUS | Status: DC | PRN

## 2024-05-29 MED ORDER — PLASMA-LYTE A IV SOLN: INTRAVENOUS | Status: DC | PRN

## 2024-05-29 MED ORDER — ASPIRIN 81 MG PO CHEW
324.0000 mg | CHEWABLE_TABLET | Freq: Once | ORAL | Status: AC
  Administered 2024-05-29: 324 mg via ORAL
  Filled 2024-05-29: qty 4

## 2024-05-29 MED ORDER — ONDANSETRON HCL 4 MG/2ML IJ SOLN: 4.0000 mg | Freq: Four times a day (QID) | INTRAMUSCULAR | Status: DC | PRN

## 2024-05-29 MED ORDER — BISACODYL 10 MG RE SUPP: 10.0000 mg | Freq: Every day | RECTAL | Status: DC

## 2024-05-29 MED ORDER — DEXMEDETOMIDINE HCL IN NACL 400 MCG/100ML IV SOLN: 0.0000 ug/kg/h | INTRAVENOUS | Status: DC

## 2024-05-29 MED ORDER — BISACODYL 5 MG PO TBEC
10.0000 mg | DELAYED_RELEASE_TABLET | Freq: Every day | ORAL | Status: DC
  Administered 2024-05-30 – 2024-05-31 (×2): 10 mg via ORAL
  Filled 2024-05-29 (×3): qty 2

## 2024-05-29 MED ORDER — MIDAZOLAM HCL 2 MG/2ML IJ SOLN: 2.0000 mg | INTRAMUSCULAR | Status: DC | PRN

## 2024-05-29 MED ORDER — CHLORHEXIDINE GLUCONATE 0.12 % MT SOLN
15.0000 mL | OROMUCOSAL | Status: AC
  Administered 2024-05-29: 15 mL via OROMUCOSAL
  Filled 2024-05-29: qty 15

## 2024-05-29 MED ORDER — FENTANYL CITRATE (PF) 100 MCG/2ML IJ SOLN
INTRAMUSCULAR | Status: DC | PRN
  Administered 2024-05-29 (×2): 100 ug via INTRAVENOUS
  Administered 2024-05-29: 50 ug via INTRAVENOUS
  Administered 2024-05-29 (×5): 100 ug via INTRAVENOUS

## 2024-05-29 MED ORDER — PHENYLEPHRINE HCL-NACL 20-0.9 MG/250ML-% IV SOLN: 0.0000 ug/min | INTRAVENOUS | Status: DC

## 2024-05-29 MED ORDER — HEPARIN SODIUM (PORCINE) 1000 UNIT/ML IJ SOLN
INTRAMUSCULAR | Status: AC
Start: 2024-05-29 — End: 2024-05-29
  Filled 2024-05-29: qty 1

## 2024-05-29 MED ORDER — METOPROLOL TARTRATE 5 MG/5ML IV SOLN: 2.5000 mg | INTRAVENOUS | Status: DC | PRN

## 2024-05-29 SURGICAL SUPPLY — 77 items
ADAPTER MULTI PERFUSION 15 (ADAPTER) ×2 IMPLANT
BAG DECANTER FOR FLEXI CONT (MISCELLANEOUS) ×2 IMPLANT
BLADE CLIPPER SURG (BLADE) ×2 IMPLANT
BLADE STERNUM SYSTEM 6 (BLADE) ×2 IMPLANT
BNDG ELASTIC 4INX 5YD STR LF (GAUZE/BANDAGES/DRESSINGS) IMPLANT
BNDG ELASTIC 4X5.8 VLCR STR LF (GAUZE/BANDAGES/DRESSINGS) ×2 IMPLANT
BNDG ELASTIC 6INX 5YD STR LF (GAUZE/BANDAGES/DRESSINGS) ×2 IMPLANT
BNDG GAUZE DERMACEA FLUFF 4 (GAUZE/BANDAGES/DRESSINGS) ×2 IMPLANT
CANISTER SUCTION 3000ML PPV (SUCTIONS) ×2 IMPLANT
CANNULA AORTIC ROOT 9FR (CANNULA) ×2 IMPLANT
CANNULA EZ GLIDE AORTIC 21FR (CANNULA) ×2 IMPLANT
CANNULA MC2 2 STG 36/46 NON-V (CANNULA) IMPLANT
CANNULA VESSEL 3MM BLUNT TIP (CANNULA) ×6 IMPLANT
CATH ROBINSON RED A/P 18FR (CATHETERS) ×2 IMPLANT
CATH THORACIC 36FR (CATHETERS) ×2 IMPLANT
CATH THORACIC 36FR RT ANG (CATHETERS) ×2 IMPLANT
CLIP TI MEDIUM 24 (CLIP) IMPLANT
CLIP TI WIDE RED SMALL 24 (CLIP) IMPLANT
CONTAINER PROTECT SURGISLUSH (MISCELLANEOUS) ×4 IMPLANT
DERMABOND ADVANCED .7 DNX12 (GAUZE/BANDAGES/DRESSINGS) IMPLANT
DRAPE SRG 135X102X78XABS (DRAPES) ×2 IMPLANT
DRAPE WARM FLUID 44X44 (DRAPES) ×2 IMPLANT
DRSG COVADERM 4X14 (GAUZE/BANDAGES/DRESSINGS) ×2 IMPLANT
ELECTRODE REM PT RTRN 9FT ADLT (ELECTROSURGICAL) ×4 IMPLANT
FELT TEFLON 1X6 (MISCELLANEOUS) ×4 IMPLANT
GAUZE SPONGE 4X4 12PLY STRL (GAUZE/BANDAGES/DRESSINGS) ×4 IMPLANT
GLOVE SS BIOGEL STRL SZ 7.5 (GLOVE) ×2 IMPLANT
GOWN STRL REUS W/ TWL LRG LVL3 (GOWN DISPOSABLE) ×8 IMPLANT
GOWN STRL REUS W/ TWL XL LVL3 (GOWN DISPOSABLE) ×4 IMPLANT
HEMOSTAT POWDER SURGIFOAM 1G (HEMOSTASIS) ×6 IMPLANT
HEMOSTAT SURGICEL 2X14 (HEMOSTASIS) ×2 IMPLANT
INSERT FOGARTY XLG (MISCELLANEOUS) IMPLANT
KIT BASIN OR (CUSTOM PROCEDURE TRAY) ×2 IMPLANT
KIT SUCTION CATH 14FR (SUCTIONS) ×4 IMPLANT
KIT TURNOVER KIT B (KITS) ×2 IMPLANT
KIT VASOVIEW HEMOPRO 2 VH 4000 (KITS) ×2 IMPLANT
MARKER GRAFT CORONARY BYPASS (MISCELLANEOUS) ×6 IMPLANT
NS IRRIG 1000ML POUR BTL (IV SOLUTION) ×10 IMPLANT
PACK E OPEN HEART (SUTURE) ×2 IMPLANT
PACK OPEN HEART (CUSTOM PROCEDURE TRAY) ×2 IMPLANT
PAD ARMBOARD POSITIONER FOAM (MISCELLANEOUS) ×4 IMPLANT
PAD ELECT DEFIB RADIOL ZOLL (MISCELLANEOUS) ×2 IMPLANT
PENCIL BUTTON HOLSTER BLD 10FT (ELECTRODE) ×2 IMPLANT
POSITIONER HEAD DONUT 9IN (MISCELLANEOUS) ×2 IMPLANT
PUNCH AORTIC ROTATE 4.0MM (MISCELLANEOUS) IMPLANT
PUNCH AORTIC ROTATE 4.5MM 8IN (MISCELLANEOUS) IMPLANT
PUNCH AORTIC ROTATE 5MM 8IN (MISCELLANEOUS) IMPLANT
SET MPS 3-ND DEL (MISCELLANEOUS) IMPLANT
SUPPORT HEART JANKE-BARRON (MISCELLANEOUS) ×2 IMPLANT
SUT BONE WAX W31G (SUTURE) ×2 IMPLANT
SUT MNCRL AB 4-0 PS2 18 (SUTURE) IMPLANT
SUT PROLENE 3 0 SH DA (SUTURE) ×2 IMPLANT
SUT PROLENE 4 0 SH DA (SUTURE) IMPLANT
SUT PROLENE 4-0 RB1 .5 CRCL 36 (SUTURE) IMPLANT
SUT PROLENE 5 0 C 1 36 (SUTURE) IMPLANT
SUT PROLENE 6 0 C 1 30 (SUTURE) ×4 IMPLANT
SUT PROLENE 7 0 BV1 MDA (SUTURE) ×2 IMPLANT
SUT PROLENE 8 0 BV175 6 (SUTURE) IMPLANT
SUT STEEL 6MS V (SUTURE) ×2 IMPLANT
SUT STEEL STERNAL CCS#1 18IN (SUTURE) IMPLANT
SUT STEEL SZ 6 DBL 3X14 BALL (SUTURE) ×2 IMPLANT
SUT VIC AB 1 CTX36XBRD ANBCTR (SUTURE) ×4 IMPLANT
SUT VIC AB 2-0 CT1 TAPERPNT 27 (SUTURE) IMPLANT
SUT VIC AB 2-0 CTX 27 (SUTURE) IMPLANT
SUT VIC AB 3-0 SH 27X BRD (SUTURE) IMPLANT
SUT VIC AB 3-0 X1 27 (SUTURE) IMPLANT
SYSTEM SAHARA CHEST DRAIN ATS (WOUND CARE) ×2 IMPLANT
TAPE CLOTH SURG 4X10 WHT LF (GAUZE/BANDAGES/DRESSINGS) IMPLANT
TAPE PAPER 2X10 WHT MICROPORE (GAUZE/BANDAGES/DRESSINGS) IMPLANT
TOWEL GREEN STERILE (TOWEL DISPOSABLE) ×2 IMPLANT
TOWEL GREEN STERILE FF (TOWEL DISPOSABLE) ×2 IMPLANT
TRAY FOLEY SLVR 16FR TEMP STAT (SET/KITS/TRAYS/PACK) ×2 IMPLANT
TUBE SUCT INTRACARD DLP 20F (MISCELLANEOUS) ×2 IMPLANT
TUBE SUCTION CARDIAC 10FR (CANNULA) ×2 IMPLANT
TUBING LAP HI FLOW INSUFFLATIO (TUBING) ×2 IMPLANT
UNDERPAD 30X36 HEAVY ABSORB (UNDERPADS AND DIAPERS) ×2 IMPLANT
WATER STERILE IRR 1000ML POUR (IV SOLUTION) ×4 IMPLANT

## 2024-05-29 NOTE — Op Note (Signed)
 NAMECOLETON, WOON MEDICAL RECORD NO: [id number] ACCOUNT NO: [id number] DATE OF BIRTH: 04/12/1951 FACILITY: [facility] LOCATION: [facility] PHYSICIAN: Elspeth BROCKS. Kerrin, MD  Operative Report   DATE OF PROCEDURE: 05/29/2024  PREOPERATIVE DIAGNOSIS:  Severe two-vessel coronary artery disease status post non-ST elevation myocardial infarction.  POSTOPERATIVE DIAGNOSIS:  Severe two-vessel coronary artery disease status post non-ST elevation myocardial infarction.  PROCEDURES PERFORMED:   Median sternotomy, intracorporeal circulation,  Coronary artery bypass grafting x 3  Left internal mammary artery to LAD,  Saphenous vein graft to second diagonal,  Saphenous vein graft to obtuse marginal 1   Endoscopic vein harvest right thigh.  SURGEON:  Elspeth BROCKS. Kerrin, MD.  ASSISTANT:  Lemond Cera, PA.  Experienced assistance was necessary for this case due to surgical complexity.  Wayne Gold independently harvested the saphenous vein endoscopically while I harvested the mammary artery, then assisted with exposure, retraction of delicate tissue, suctioning and suture management during the anastomosis.  ANESTHESIA:  General.  FINDINGS:  Transesophageal echo showed good left ventricular function with no significant valvular pathology.  Post bypass slightly improved left ventricular function.  Good quality targets.  Mammary artery good quality.  Vein small but good quality.  Ramus intermedius too small to graft.  CLINICAL NOTE:  Mr. Sullenberger is a 73 year old gentleman with multiple cardiac risk factors.  He presented with a 2-week history of fatigue and chest tightness.  He ruled in for non-ST elevation MI.  At catheterization, he was found to have left main and two-vessel coronary artery disease.  He was advised to undergo coronary artery bypass grafting.  The indications, risks, benefits, and alternatives were discussed in detail with the patient.  He understood and accepted the risks and agreed to  proceed.  OPERATIVE NOTE:  Mr. Stecher was brought to the operating room on 05/29/2024.  He had induction of general anesthesia and was intubated.  Intravenous antibiotics were administered.  A Foley catheter was placed.  Transesophageal echocardiography was performed by Dr. Lonni Custard.  Please see his separately dictated note for full details of the procedure.  The chest, abdomen, and legs were prepped and draped in the usual sterile fashion.  A time-out was performed.  A median sternotomy was performed and the left internal mammary artery was harvested under direct vision.  Simultaneously, an incision was made in the medial aspect of the right leg at the level of the knee.  The greater saphenous vein was harvested from the right thigh endoscopically.  The saphenous vein was relatively small in caliber, but it was good quality and did not have any significant sclerotic areas or varicosities.  The left internal mammary artery was a good quality vessel with excellent flow when divided distally.  2000 units of heparin  was administered during the vessel harvest.  The remainder of the full heparin  dose was given prior to opening the pericardium.  A sternal retractor was placed and was gradually opened.  The patient did have a relatively stiff chest wall.  The pericardium was opened.  The ascending aorta was inspected and there was no evidence of atherosclerotic disease.  After confirming adequate anticoagulation with ACT measurement, the aorta was cannulated via concentric 2-0 Ethibond pledgeted pursestring sutures.  A dual-stage venous cannula was placed via a pursestring suture in the right atrial appendage.  Cardiopulmonary bypass was initiated.  Flows were maintained per protocol.  The patient was cooled to 34 degrees Celsius.  The coronary arteries were inspected and anastomotic sites were chosen.  The conduits were  inspected and cut to length.  A foam pad was placed in the  pericardium to insulate  the heart and a temperature probe was placed in the myocardial septum and a cardioplegia cannula was placed in the ascending aorta.  The aorta was cross-clamped.  The left ventricle was emptied via the aortic root vent.  Cardiac arrest then was achieved with a combination of cold antegrade blood cardioplegia and topical ice saline.  1 liter of cardioplegia was administered.  There was a rapid diastolic arrest.  There was septal cooling to 13 degrees Celsius.  A reversed saphenous vein graft was placed to the first obtuse marginal.  This was a 1.5-mm good-quality target vessel.  The vein was anastomosed end-to-side with a running 7-0 Prolene suture.  A probe passed easily proximally and distally.  Cardioplegia was administered down the vein with good flow and good hemostasis.  Cardioplegia continued to be administered via that vein graft while the diagonal anastomosis was performed.  The diagonal was a 1.5-mm good-quality target vessel.  The vein was anastomosed end-to-side with a running 7-0 Prolene suture.  A probe passed easily proximally and distally and cardioplegia was administered down that vein with good flow and good hemostasis.  The left internal mammary artery was brought through the window of the pericardium.  The distal end was beveled.  It was anastomosed end-to-side to the distal LAD.  The distal LAD was a 1.5-mm good quality target.  The mammary was of good quality.  The anastomosis was performed with a running 8-0 Prolene suture.  At the completion of the mammary to LAD anastomosis, the bulldog clamp was removed.  Rapid septal rewarming was noted.  The bulldog clamp was replaced.  The mammary pedicle was tacked to the epicardial surface of the heart with 6-0 Prolene sutures.  The vein grafts were cut to length.  The cardioplegia cannula was removed from the ascending aorta.  The proximal vein graft anastomoses were performed to 4.0-mm punch aortotomies with running 6-0 Prolene sutures.  At  the completion of the final proximal anastomosis, the patient was placed in Trendelenburg position.  Lidocaine  was administered.  The aortic root was de-aired and the aortic cross clamp was removed.  The total cross clamp time was 47 minutes.  The patient spontaneously resumed sinus rhythm and did not require defibrillation.  While rewarming was completed, all proximal and distal anastomoses were inspected for hemostasis.  Epicardial pacing wires were placed on the right ventricle and right atrium.  When the patient had rewarmed to a core temperature of 37 degrees Celsius, he was weaned from cardiopulmonary bypass on the first attempt.  Total bypass time was 77 minutes.  He did not require inotropic support.  The initial cardiac index was greater than 2 liters per meter squared and the patient remained hemodynamically stable throughout the post bypass period.  A test dose of protamine  was administered and was well tolerated.  The transesophageal echocardiogram showed good left ventricular function.  The atrial and aortic cannulae were removed.  The remainder of the protamine  was administered without incident.  The chest was irrigated with warm saline.  Hemostasis was achieved.  The left pleural and mediastinal chest tubes were placed via separate subcostal incisions and secured with #1 silk sutures.  The pericardium was reapproximated with interrupted 3-0 silk sutures.  The sternum was closed with a combination of single and double heavy-gauge stainless steel wires.  The pectoralis fascia and subcutaneous tissue and skin were closed in a standard  fashion.  All sponge, needle, and instrument counts were  correct at the end of the procedure.    MUK D: 05/29/2024 5:33:53 pm T: 05/29/2024 10:44:00 pm  JOB: [id number]/ [id number]

## 2024-05-29 NOTE — Anesthesia Procedure Notes (Signed)
 Central Venous Catheter Insertion Performed by: Leopoldo Bruckner, MD, anesthesiologist Start/End7/18/2025 7:08 AM, 05/29/2024 7:22 AM Patient location: Pre-op. Preanesthetic checklist: patient identified, IV checked, site marked, risks and benefits discussed, surgical consent, monitors and equipment checked, pre-op evaluation, timeout performed and anesthesia consent Hand hygiene performed  and maximum sterile barriers used  PA cath was placed.Swan type:thermodilution Procedure performed without using ultrasound guided technique. Attempts: 1 Patient tolerated the procedure well with no immediate complications.

## 2024-05-29 NOTE — Anesthesia Procedure Notes (Signed)
 Procedure Name: Intubation Date/Time: 05/29/2024 7:55 AM  Performed by: Lamar Lucie DASEN, CRNAPre-anesthesia Checklist: Patient identified, Emergency Drugs available, Suction available and Patient being monitored Patient Re-evaluated:Patient Re-evaluated prior to induction Oxygen Delivery Method: Circle system utilized Preoxygenation: Pre-oxygenation with 100% oxygen Induction Type: IV induction Ventilation: Mask ventilation without difficulty Laryngoscope Size: Mac and 4 Grade View: Grade I Tube type: Oral Tube size: 8.0 mm Number of attempts: 1 Airway Equipment and Method: Stylet and Oral airway Placement Confirmation: ETT inserted through vocal cords under direct vision, positive ETCO2 and breath sounds checked- equal and bilateral Secured at: 23 cm Tube secured with: Tape Dental Injury: Teeth and Oropharynx as per pre-operative assessment

## 2024-05-29 NOTE — Hospital Course (Addendum)
  Referring: No ref. provider found Primary Care: Duanne Butler DASEN, MD Primary Cardiologist:Peter [place], MD     History of Present Illness:  At time of CT surgical consultation:   The patient is a 73 year old male we are asked to see in cardiothoracic surgical consultation for consideration of CABG.  Patient presented to the emergency department with complaints of chest tightness and fatigue.  The pain has been persistent for approximately 2 weeks.  He did not radiate.  He initially managed the symptoms with aspirin .  When the symptoms began he notes significantly more fatigue and describes a lack of energy and feeling more tired than normal.  He denies palpitations.  He does not have lower extremity edema.  He denies shortness of breath or orthopnea.  Cardiac risk factors include hyperlipidemia.  High-sensitivity troponin T's were elevated peaking at 60 ruling in for non-STEMI.  He did have some subtle ST changes inferiorly on EKG.  He is a non-smoker.  His BMI is 29.  He is not not anemic and has normal renal function.  He has no history of diabetes.  Echocardiogram and cardiac catheterization have been performed and results are as described below.  He has significant left main lesion which is 70% stenosed.  He has an ostial circumflex to proximal circumflex lesion at 90% stenosis.  Additional significant lesions are a 70% LAD stenosis and a 85% ramus gnosis.  Ejection fraction is noted to be normal by echocardiogram.  He has no significant valvular findings.  On cath he did have mildly elevated LVEDP at 17 mmHg.  Following patient reviewed and evaluation of all of the studies Dr. Kerrin recommended proceeding with CABG.  Hospital course: Following full diagnostic evaluation and medical stabilization it was felt the patient could proceed and on 05/29/2024 he was taken the operating room at which time he underwent CABG x 3.  A LIMA-LAD, SVG-diagonal and SVG-OM graft was placed.  The patient  tolerated procedure well was taken to the surgical intensive care unit in stable condition.  The patient was extubated the evening of surgery.  He underwent removal of arterial lines, swan ganz catheter, pacing wires and chest tubes without difficulty.  He was maintaining NSR and felt stable for transfer to the progressive care unit on 05/31/2024.

## 2024-05-29 NOTE — Anesthesia Preprocedure Evaluation (Addendum)
 Anesthesia Evaluation  Patient identified by MRN, date of birth, ID band Patient awake    Reviewed: Allergy & Precautions, NPO status , Patient's Chart, lab work & pertinent test results  History of Anesthesia Complications Negative for: history of anesthetic complications  Airway Mallampati: III  TM Distance: >3 FB Neck ROM: Full    Dental  (+) Dental Advisory Given   Pulmonary neg shortness of breath, neg sleep apnea, neg COPD, neg recent URI   breath sounds clear to auscultation       Cardiovascular + CAD and + Past MI   Rhythm:Regular  1. Left ventricular ejection fraction, by estimation, is 60 to 65%. The  left ventricle has normal function. The left ventricle has no regional  wall motion abnormalities. There is mild left ventricular hypertrophy of  the basal-septal segment. Left  ventricular diastolic parameters are consistent with Grade I diastolic  dysfunction (impaired relaxation).   2. Right ventricular systolic function is normal. The right ventricular  size is normal. Tricuspid regurgitation signal is inadequate for assessing  PA pressure.   3. The mitral valve is normal in structure. No evidence of mitral valve  regurgitation. No evidence of mitral stenosis.   4. The aortic valve is tricuspid. Aortic valve regurgitation is not  visualized. No aortic stenosis is present.      Mid LM to Dist LM lesion is 70% stenosed.   Ost Cx to Prox Cx lesion is 90% stenosed.   Mid LAD lesion is 70% stenosed.   1st Diag lesion is 30% stenosed.   2nd Diag lesion is 30% stenosed.   Ramus lesion is 85% stenosed.   2nd Mrg lesion is 40% stenosed.   1.  Moderately calcified coronary arteries with significant two-vessel and distal left main disease. 2.  Left ventricular angiography was not performed.  EF was normal by echo. 3.  Mildly elevated left ventricular end-diastolic pressure at 17 mmHg.   Recommendations: Distal  left main stenosis extending into the ostium of left circumflex in addition to significant LAD and ramus disease. Recommend evaluation for CABG. Resume heparin  drip 2 hours after TR band removal.      Neuro/Psych negative neurological ROS     GI/Hepatic negative GI ROS, Neg liver ROS,,,  Endo/Other  negative endocrine ROS    Renal/GU      Musculoskeletal negative musculoskeletal ROS (+)    Abdominal   Peds  Hematology negative hematology ROS (+)   Anesthesia Other Findings   Reproductive/Obstetrics                              Anesthesia Physical Anesthesia Plan  ASA: 4  Anesthesia Plan: General   Post-op Pain Management:    Induction: Intravenous  PONV Risk Score and Plan: 3 and Ondansetron   Airway Management Planned: Oral ETT  Additional Equipment: Arterial line, CVP, PA Cath, TEE and Ultrasound Guidance Line Placement  Intra-op Plan:   Post-operative Plan: Post-operative intubation/ventilation  Informed Consent: I have reviewed the patients History and Physical, chart, labs and discussed the procedure including the risks, benefits and alternatives for the proposed anesthesia with the patient or authorized representative who has indicated his/her understanding and acceptance.     Dental advisory given  Plan Discussed with: CRNA  Anesthesia Plan Comments:          Anesthesia Quick Evaluation

## 2024-05-29 NOTE — Anesthesia Procedure Notes (Signed)
 Arterial Line Insertion Start/End7/18/2025 7:15 AM Performed by: Lamar Lucie DASEN, CRNA, CRNA  Patient location: Pre-op. Preanesthetic checklist: patient identified, IV checked, site marked, risks and benefits discussed, surgical consent, monitors and equipment checked, pre-op evaluation, timeout performed and anesthesia consent Lidocaine  1% used for infiltration Left, radial was placed Catheter size: 20 G Hand hygiene performed  and maximum sterile barriers used   Attempts: 1 Procedure performed without using ultrasound guided technique. Following insertion, dressing applied and Biopatch. Post procedure assessment: normal and unchanged

## 2024-05-29 NOTE — Brief Op Note (Signed)
 05/25/2024 - 05/29/2024  5:19 PM  PATIENT:  Elsie KATHEE Verdon Mickey.  73 y.o. male  PRE-OPERATIVE DIAGNOSIS:  CORONARY ARTERY DISEASE  POST-OPERATIVE DIAGNOSIS:  CORONARY ARTERY DISEASE  PROCEDURE:  Procedure(s): CORONARY ARTERY BYPASS GRAFTING (CABG) TIMES THREE USING LEFT INTERNAL MAMMARY ARTERY AND ENDOSCOPICALLY HARVESTED RIGHT GREATER SAPHENOUS VEIN (N/A) ECHOCARDIOGRAM, TRANSESOPHAGEAL (N/A) Vein harvest time: 45 min Vein prep time: LIMA to LAD SVG to D2 SVG to OM1  SURGEON:  Surgeons and Role:    * Kerrin Elspeth BROCKS, MD - Primary  PHYSICIAN ASSISTANT: WAYNE GOLD PA-C  ASSISTANTS: KRISTINA PRICE RNFA   ANESTHESIA:   general  EBL: 850 ml  BLOOD ADMINISTERED:none  DRAINS: LEFT PLEURAL AND MEDIASTINAL CHEST TUBES   LOCAL MEDICATIONS USED:  NONE  SPECIMEN:  No Specimen  DISPOSITION OF SPECIMEN:  N/A  COUNTS:  YES  TOURNIQUET:  * No tourniquets in log *  DICTATION: .Other Dictation: Dictation Number PENDING  PLAN OF CARE: Admit to inpatient   PATIENT DISPOSITION:  ICU - intubated and hemodynamically stable.   Delay start of Pharmacological VTE agent (>24hrs) due to surgical blood loss or risk of bleeding: yes  COMPLICATIONS: NO KNOWN

## 2024-05-29 NOTE — Plan of Care (Signed)
   Problem: Education: Goal: Knowledge of General Education information will improve Description: Including pain rating scale, medication(s)/side effects and non-pharmacologic comfort measures Outcome: Progressing   Problem: Activity: Goal: Risk for activity intolerance will decrease Outcome: Progressing

## 2024-05-29 NOTE — Progress Notes (Signed)
 Attempted to see patient this morning around 7, already left for the OR.  Post CABG he will move to the ICU and will be under cardiothoracic surgery care.  Hospitalist team will not plan to follow the patient, call us  back if needed.  Dhairya Corales M. Trixie, MD, PhD [facility]  Between 7 am - 7 pm you can contact me via Amion (for emergencies) or Securechat (non urgent matters).  I am not available 7 pm - 7 am, please contact night coverage MD/APP via Amion

## 2024-05-29 NOTE — Progress Notes (Signed)
 PHARMACY - ANTICOAGULATION CONSULT NOTE  Pharmacy Consult for heparin  Indication: chest pain/ACS  Allergies  Allergen Reactions   Codeine Itching   Morphine  And Codeine Other (See Comments)    Doesn't work   Valium [Diazepam] Other (See Comments)    Pt states it jacks him up    Patient Measurements: Height: (P) 5' 9 (175.3 cm) Weight: (P) 88.7 kg (195 lb 8.8 oz) IBW/kg (Calculated) : (P) 70.7 HEPARIN  DW (KG): (P) 88.5  Vital Signs: Temp: (P) 98.3 F (36.8 C) (07/18 0630) Temp Source: (P) Oral (07/18 0630) BP: 107/68 (07/18 0730) Pulse Rate: 69 (07/18 0731)  Labs: Recent Labs    05/28/24 1026 05/28/24 2017 05/28/24 2346 05/29/24 0203 05/29/24 0448 05/29/24 0811  HGB  --   --  16.7 15.7 16.8 14.3  HCT  --   --  50.2 47.1 50.7 42.0  PLT  --   --  224 217 207  --   LABPROT  --   --   --   --  13.3  --   INR  --   --   --   --  1.0  --   HEPARINUNFRC 0.15* 0.49  --  0.54  --   --   CREATININE  --   --  0.98  --  0.96 0.90    Estimated Creatinine Clearance: 82.9 mL/min (by C-G formula based on SCr of 0.9 mg/dL).    Assessment: 73 yo M presented to the ED with CP. Started on IV heparin . CBC is WNL and he is not on anticoagulation PTA.    Heparin  level 0.54 is therapeutic on 1500 units/hr.  CABG 7/18   Goal of Therapy:  Heparin  level 0.3-0.7 units/ml Monitor platelets by anticoagulation protocol: Yes   Plan:  Continue heparin  1500 units/hr  Monitor CBC, s/sx bleeding F/u post CABG  Jinnie Door, PharmD, BCPS, BCCP Clinical Pharmacist  Please check AMION for all [facility] phone numbers After 10:00 PM, call Main Pharmacy [phone]

## 2024-05-29 NOTE — Transfer of Care (Signed)
 Immediate Anesthesia Transfer of Care Note  Patient: [name].  Procedure(s) Performed: CORONARY ARTERY BYPASS GRAFTING (CABG) TIMES THREE USING LEFT INTERNAL MAMMARY ARTERY AND ENDOSCOPICALLY HARVESTED RIGHT GREATER SAPHENOUS VEIN (Chest) ECHOCARDIOGRAM, TRANSESOPHAGEAL  Patient Location: ICU  Anesthesia Type:General  Level of Consciousness: sedated and Patient remains intubated per anesthesia plan  Airway & Oxygen Therapy: Patient remains intubated per anesthesia plan and Patient placed on Ventilator (see vital sign flow sheet for setting)  Post-op Assessment: Report given to RN and Post -op Vital signs reviewed and stable  Post vital signs: Reviewed and stable  Last Vitals:  Vitals Value Taken Time  BP 108/65   Temp 36.7 C 05/29/24 12:07  Pulse 82   Resp 16 05/29/24 12:07  SpO2    Vitals shown include unfiled device data.  Last Pain:  Vitals:   05/29/24 0643  TempSrc:   PainSc: 0-No pain         Complications: No notable events documented.  Patient transported to ICU with standard monitors (HR, BP, SPO2, RR) and emergency drugs/equipment. Controlled ventilation maintained via ambu bag. Report given to bedside RN and respiratory therapist. Pt connected to ICU monitor and ventilator. All questions answered and vital signs stable before leaving

## 2024-05-29 NOTE — Procedures (Signed)
 Extubation Procedure Note  Patient Details:   Name: [name]. DOB: 06/06/1951 MRN: [id number]   Airway Documentation:    Vent end date: 05/29/24 Vent end time: 1620   Evaluation  O2 sats: stable throughout Complications: No apparent complications Patient did tolerate procedure well. Bilateral Breath Sounds: Clear   Yes  Patient extubated per order to 2L [place] with no apparent complications. Positive cuff leak was noted prior to extubation. Patient is alert, has strong cough, and is able to speak. Vitals are stable.   [name] 05/29/2024, 4:24 PM

## 2024-05-29 NOTE — Plan of Care (Signed)
   Problem: Clinical Measurements: Goal: Ability to maintain clinical measurements within normal limits will improve Outcome: Progressing

## 2024-05-29 NOTE — Interval H&P Note (Signed)
 History and Physical Interval Note:  05/29/2024 7:01 AM  [name].  has presented today for surgery, with the diagnosis of CAD.  The various methods of treatment have been discussed with the patient and family. After consideration of risks, benefits and other options for treatment, the patient has consented to  Procedure(s): CORONARY ARTERY BYPASS GRAFTING (CABG) (N/A) ECHOCARDIOGRAM, TRANSESOPHAGEAL (N/A) as a surgical intervention.  The patient's history has been reviewed, patient examined, no change in status, stable for surgery.  I have reviewed the patient's chart and labs.  Questions were answered to the patient's satisfaction.     Elspeth JAYSON Millers

## 2024-05-29 NOTE — Anesthesia Procedure Notes (Signed)
 Central Venous Catheter Insertion Performed by: Leopoldo Bruckner, MD, anesthesiologist Start/End7/18/2025 7:08 AM, 05/29/2024 7:22 AM Patient location: Pre-op. Preanesthetic checklist: patient identified, IV checked, site marked, risks and benefits discussed, surgical consent, monitors and equipment checked, pre-op evaluation, timeout performed and anesthesia consent Patient sedated Hand hygiene performed  and maximum sterile barriers used  Catheter size: 8.5 Fr Sheath introducer Procedure performed using ultrasound guided technique. Ultrasound Notes:anatomy identified, needle tip was noted to be adjacent to the nerve/plexus identified, no ultrasound evidence of intravascular and/or intraneural injection and image(s) printed for medical record Attempts: 1 Following insertion, line sutured and dressing applied. Post procedure assessment: blood return through all ports, free fluid flow and no air  Patient tolerated the procedure well with no immediate complications.

## 2024-05-30 ENCOUNTER — Inpatient Hospital Stay (HOSPITAL_COMMUNITY)

## 2024-05-30 DIAGNOSIS — Z951 Presence of aortocoronary bypass graft: Secondary | ICD-10-CM

## 2024-05-30 LAB — BASIC METABOLIC PANEL WITH GFR
Anion gap: 10 (ref 5–15)
Anion gap: 10 (ref 5–15)
BUN: 12 mg/dL (ref 8–23)
BUN: 17 mg/dL (ref 8–23)
CO2: 20 mmol/L — ABNORMAL LOW (ref 22–32)
CO2: 23 mmol/L (ref 22–32)
Calcium: 8 mg/dL — ABNORMAL LOW (ref 8.9–10.3)
Calcium: 8.3 mg/dL — ABNORMAL LOW (ref 8.9–10.3)
Chloride: 104 mmol/L (ref 98–111)
Chloride: 101 mmol/L (ref 98–111)
Creatinine, Ser: 0.95 mg/dL (ref 0.61–1.24)
Creatinine, Ser: 1.15 mg/dL (ref 0.61–1.24)
GFR, Estimated: >60 mL/min (ref >60)
GFR, Estimated: >60 mL/min (ref >60)
Glucose, Bld: 135 mg/dL — ABNORMAL HIGH (ref 70–99)
Glucose, Bld: 133 mg/dL — ABNORMAL HIGH (ref 70–99)
Potassium: 4.3 mmol/L (ref 3.5–5.1)
Potassium: 4.3 mmol/L (ref 3.5–5.1)
Sodium: 134 mmol/L — ABNORMAL LOW (ref 135–145)
Sodium: 134 mmol/L — ABNORMAL LOW (ref 135–145)

## 2024-05-30 LAB — CBC
HCT: 43 % (ref 39.0–52.0)
HCT: 43.4 % (ref 39.0–52.0)
Hemoglobin: 14.3 g/dL (ref 13.0–17.0)
Hemoglobin: 14.3 g/dL (ref 13.0–17.0)
MCH: 29.4 pg (ref 26.0–34.0)
MCH: 29.6 pg (ref 26.0–34.0)
MCHC: 33.3 g/dL (ref 30.0–36.0)
MCHC: 32.9 g/dL (ref 30.0–36.0)
MCV: 88.5 fL (ref 80.0–100.0)
MCV: 89.9 fL (ref 80.0–100.0)
Platelets: 148 K/uL — ABNORMAL LOW (ref 150–400)
Platelets: 144 10*3/uL — ABNORMAL LOW (ref 150–400)
RBC: 4.86 MIL/uL (ref 4.22–5.81)
RBC: 4.83 MIL/uL (ref 4.22–5.81)
RDW: 13.9 % (ref 11.5–15.5)
RDW: 14.2 % (ref 11.5–15.5)
WBC: 19.1 K/uL — ABNORMAL HIGH (ref 4.0–10.5)
WBC: 17.7 10*3/uL — ABNORMAL HIGH (ref 4.0–10.5)
nRBC: 0 % (ref 0.0–0.2)
nRBC: 0 % (ref 0.0–0.2)

## 2024-05-30 LAB — MAGNESIUM
Magnesium: 2.5 mg/dL — ABNORMAL HIGH (ref 1.7–2.4)
Magnesium: 2.5 mg/dL — ABNORMAL HIGH (ref 1.7–2.4)

## 2024-05-30 LAB — GLUCOSE, CAPILLARY
Glucose-Capillary: 135 mg/dL — ABNORMAL HIGH (ref 70–99)
Glucose-Capillary: 118 mg/dL — ABNORMAL HIGH (ref 70–99)
Glucose-Capillary: 149 mg/dL — ABNORMAL HIGH (ref 70–99)
Glucose-Capillary: 132 mg/dL — ABNORMAL HIGH (ref 70–99)
Glucose-Capillary: 164 mg/dL — ABNORMAL HIGH (ref 70–99)
Glucose-Capillary: 148 mg/dL — ABNORMAL HIGH (ref 70–99)
Glucose-Capillary: 129 mg/dL — ABNORMAL HIGH (ref 70–99)
Glucose-Capillary: 137 mg/dL — ABNORMAL HIGH (ref 70–99)
Glucose-Capillary: 154 mg/dL — ABNORMAL HIGH (ref 70–99)
Glucose-Capillary: 128 mg/dL — ABNORMAL HIGH (ref 70–99)

## 2024-05-30 MED ORDER — METOPROLOL TARTRATE 25 MG PO TABS
25.0000 mg | ORAL_TABLET | Freq: Two times a day (BID) | ORAL | Status: DC
  Administered 2024-05-30 – 2024-06-02 (×7): 25 mg via ORAL
  Filled 2024-05-30 (×7): qty 1

## 2024-05-30 MED ORDER — DIPHENHYDRAMINE HCL 25 MG PO CAPS
25.0000 mg | ORAL_CAPSULE | Freq: Once | ORAL | Status: AC
  Administered 2024-05-30: 25 mg via ORAL
  Filled 2024-05-30: qty 1

## 2024-05-30 MED ORDER — FUROSEMIDE 10 MG/ML IJ SOLN
40.0000 mg | Freq: Once | INTRAMUSCULAR | Status: AC
  Administered 2024-05-30: 40 mg via INTRAVENOUS
  Filled 2024-05-30: qty 4

## 2024-05-30 MED ORDER — ENOXAPARIN SODIUM 40 MG/0.4ML IJ SOSY
40.0000 mg | PREFILLED_SYRINGE | Freq: Every day | INTRAMUSCULAR | Status: DC
  Administered 2024-05-30 – 2024-06-01 (×3): 40 mg via SUBCUTANEOUS
  Filled 2024-05-30 (×3): qty 0.4

## 2024-05-30 MED ORDER — INSULIN ASPART 100 UNIT/ML IJ SOLN
0.0000 [IU] | INTRAMUSCULAR | Status: DC
  Administered 2024-05-30 – 2024-05-31 (×5): 2 [IU] via SUBCUTANEOUS

## 2024-05-30 NOTE — Progress Notes (Signed)
 [address]       [place] [address]             [phone]                 1 Day Post-Op Procedure(s) (LRB): CORONARY ARTERY BYPASS GRAFTING (CABG) TIMES THREE USING LEFT INTERNAL MAMMARY ARTERY AND ENDOSCOPICALLY HARVESTED RIGHT GREATER SAPHENOUS VEIN (N/A) ECHOCARDIOGRAM, TRANSESOPHAGEAL (N/A)   Events: No events _______________________________________________________________ Vitals: BP 112/79   Pulse 99   Temp 98.2 F (36.8 C)   Resp 16   Ht (P) 5' 9 (1.753 m)   Wt 93.3 kg   SpO2 93%   BMI (P) 30.38 kg/m  Filed Weights   05/29/24 0414 05/29/24 0630 05/30/24 0447  Weight: 88.7 kg (P) 88.7 kg 93.3 kg     - Neuro: alert NAD  - Cardiovascular: sinus  Drips: none.   PAP: (22-40)/(8-22) 29/18 CO:  [3 L/min-5.5 L/min] 5.4 L/min CI:  [1.5 L/min/m2-2.68 L/min/m2] 2.65 L/min/m2  - Pulm: EWOB  ABG    Component Value Date/Time   PHART 7.352 05/29/2024 1747   PCO2ART 40.7 05/29/2024 1747   PO2ART 96 05/29/2024 1747   HCO3 22.5 05/29/2024 1747   TCO2 24 05/29/2024 1747   ACIDBASEDEF 3.0 (H) 05/29/2024 1747   O2SAT 97 05/29/2024 1747    - Abd: ND - Extremity: trace edema  .Intake/Output      07/18 0701 07/19 0700 07/19 0701 07/20 0700   P.O.     I.V. (mL/kg) 1944.7 (20.8)    Blood 940    IV Piggyback 1549.9    Total Intake(mL/kg) 4434.6 (47.5)    Urine (mL/kg/hr) 2215 (1)    Stool     Chest Tube 345    Total Output 2560    Net +1874.6            _______________________________________________________________ Labs:    Latest Ref Rng & Units 05/30/2024    4:38 AM 05/29/2024    5:47 PM 05/29/2024    4:03 PM  CBC  WBC 4.0 - 10.5 K/uL 19.1  22.5    Hemoglobin 13.0 - 17.0 g/dL 85.6  85.3    85.6  86.0   Hematocrit 39.0 - 52.0 % 43.0  42.8    42.0  41.0   Platelets 150 - 400 K/uL 148  167        Latest Ref Rng & Units 05/30/2024    4:38 AM 05/29/2024    5:47 PM 05/29/2024    4:03 PM  CMP  Glucose 70 - 99 mg/dL 864  871     BUN 8 - 23 mg/dL 12  10    Creatinine 9.38 - 1.24 mg/dL 9.04  9.01    Sodium 864 - 145 mmol/L 134  135    137  137   Potassium 3.5 - 5.1 mmol/L 4.3  4.6    4.7  4.7   Chloride 98 - 111 mmol/L 104  106    CO2 22 - 32 mmol/L 20  20    Calcium  8.9 - 10.3 mg/dL 8.0  7.8      CXR: PV congestion  _______________________________________________________________  Assessment and Plan: POD 1 s/p CABG  Neuro: pain controlled.  Holding oxy for allergy CV: on a/s/BB.  Will remove swan and A-line Pulm: IS, ambulation Renal: creat stable will diurese GI: on diet Heme: stable ID: [email or web address] Endo: SSI Dispo: continue ICU care   [name] 05/30/2024 9:26 AM

## 2024-05-31 ENCOUNTER — Inpatient Hospital Stay (HOSPITAL_COMMUNITY)

## 2024-05-31 LAB — BASIC METABOLIC PANEL WITH GFR
Anion gap: 8 (ref 5–15)
BUN: 17 mg/dL (ref 8–23)
CO2: 24 mmol/L (ref 22–32)
Calcium: 8.1 mg/dL — ABNORMAL LOW (ref 8.9–10.3)
Chloride: 101 mmol/L (ref 98–111)
Creatinine, Ser: 0.9 mg/dL (ref 0.61–1.24)
GFR, Estimated: >60 mL/min (ref >60)
Glucose, Bld: 126 mg/dL — ABNORMAL HIGH (ref 70–99)
Potassium: 4.3 mmol/L (ref 3.5–5.1)
Sodium: 133 mmol/L — ABNORMAL LOW (ref 135–145)

## 2024-05-31 LAB — CBC
HCT: 39.6 % (ref 39.0–52.0)
Hemoglobin: 13.3 g/dL (ref 13.0–17.0)
MCH: 29.9 pg (ref 26.0–34.0)
MCHC: 33.6 g/dL (ref 30.0–36.0)
MCV: 89 fL (ref 80.0–100.0)
Platelets: 138 K/uL — ABNORMAL LOW (ref 150–400)
RBC: 4.45 MIL/uL (ref 4.22–5.81)
RDW: 14.3 % (ref 11.5–15.5)
WBC: 15.3 K/uL — ABNORMAL HIGH (ref 4.0–10.5)
nRBC: 0 % (ref 0.0–0.2)

## 2024-05-31 LAB — GLUCOSE, CAPILLARY
Glucose-Capillary: 127 mg/dL — ABNORMAL HIGH (ref 70–99)
Glucose-Capillary: 114 mg/dL — ABNORMAL HIGH (ref 70–99)

## 2024-05-31 MED ORDER — SODIUM CHLORIDE 0.9% FLUSH
3.0000 mL | INTRAVENOUS | Status: DC | PRN
Start: 2024-05-31 — End: 2024-06-02

## 2024-05-31 MED ORDER — SODIUM CHLORIDE 0.9 % IV SOLN: 250.0000 mL | INTRAVENOUS | Status: AC | PRN

## 2024-05-31 MED ORDER — ~~LOC~~ CARDIAC SURGERY, PATIENT & FAMILY EDUCATION: Freq: Once | Status: AC

## 2024-05-31 MED ORDER — ORAL CARE MOUTH RINSE: 15.0000 mL | OROMUCOSAL | Status: DC | PRN

## 2024-05-31 MED ORDER — SODIUM CHLORIDE 0.9% FLUSH
3.0000 mL | Freq: Two times a day (BID) | INTRAVENOUS | Status: DC
  Administered 2024-05-31 – 2024-06-01 (×3): 3 mL via INTRAVENOUS

## 2024-05-31 NOTE — Progress Notes (Signed)
      [address]       [place] [address]             [phone]                 2 Days Post-Op Procedure(s) (LRB): CORONARY ARTERY BYPASS GRAFTING (CABG) TIMES THREE USING LEFT INTERNAL MAMMARY ARTERY AND ENDOSCOPICALLY HARVESTED RIGHT GREATER SAPHENOUS VEIN (N/A) ECHOCARDIOGRAM, TRANSESOPHAGEAL (N/A)   Events: No events _______________________________________________________________ Vitals: BP 127/86   Pulse 94   Temp 98.7 F (37.1 C) (Oral)   Resp (!) 34   Ht (P) 5' 9 (1.753 m)   Wt 92.9 kg   SpO2 92%   BMI (P) 30.24 kg/m  Filed Weights   05/29/24 0630 05/30/24 0447 05/31/24 0500  Weight: (P) 88.7 kg 93.3 kg 92.9 kg     - Neuro: alert NAD  - Cardiovascular: sinus  Drips: none.   PAP: (27-36)/(13-20) 36/20  - Pulm: EWOB  ABG    Component Value Date/Time   PHART 7.352 05/29/2024 1747   PCO2ART 40.7 05/29/2024 1747   PO2ART 96 05/29/2024 1747   HCO3 22.5 05/29/2024 1747   TCO2 24 05/29/2024 1747   ACIDBASEDEF 3.0 (H) 05/29/2024 1747   O2SAT 97 05/29/2024 1747    - Abd: ND - Extremity: trace edema  .Intake/Output      07/19 0701 07/20 0700 07/20 0701 07/21 0700   I.V. (mL/kg) 203.5 (2.2)    Blood     IV Piggyback 300.2    Total Intake(mL/kg) 503.7 (5.4)    Urine (mL/kg/hr) 1200 (0.5)    Chest Tube 210 70   Total Output 1410 70   Net -906.3 -70           _______________________________________________________________ Labs:    Latest Ref Rng & Units 05/31/2024    4:23 AM 05/30/2024    5:00 PM 05/30/2024    4:38 AM  CBC  WBC 4.0 - 10.5 K/uL 15.3  17.7  19.1   Hemoglobin 13.0 - 17.0 g/dL 86.6  85.6  85.6   Hematocrit 39.0 - 52.0 % 39.6  43.4  43.0   Platelets 150 - 400 K/uL 138  144  148       Latest Ref Rng & Units 05/31/2024    4:23 AM 05/30/2024    5:00 PM 05/30/2024    4:38 AM  CMP  Glucose 70 - 99 mg/dL 873  866  864   BUN 8 - 23 mg/dL 17  17  12    Creatinine 0.61 - 1.24 mg/dL 9.09  8.84  9.04   Sodium 135 -  145 mmol/L 133  134  134   Potassium 3.5 - 5.1 mmol/L 4.3  4.3  4.3   Chloride 98 - 111 mmol/L 101  101  104   CO2 22 - 32 mmol/L 24  23  20    Calcium  8.9 - 10.3 mg/dL 8.1  8.3  8.0     CXR: PV congestion  _______________________________________________________________  Assessment and Plan: POD 2 s/p CABG  Neuro: pain controlled.  Holding oxy for allergy CV: on a/s/BB.  Will remove wires Pulm: IS, ambulation Renal: creat stable will diurese GI: on diet Heme: stable ID: [email or web address] Endo: SSI Dispo: floor   [name] [name] 05/31/2024 8:18 AM

## 2024-06-01 ENCOUNTER — Encounter (HOSPITAL_COMMUNITY): Payer: Self-pay | Admitting: Thoracic Surgery (Cardiothoracic Vascular Surgery)

## 2024-06-01 LAB — CBC
HCT: 36.7 % — ABNORMAL LOW (ref 39.0–52.0)
Hemoglobin: 12.2 g/dL — ABNORMAL LOW (ref 13.0–17.0)
MCH: 29.5 pg (ref 26.0–34.0)
MCHC: 33.2 g/dL (ref 30.0–36.0)
MCV: 88.6 fL (ref 80.0–100.0)
Platelets: 138 K/uL — ABNORMAL LOW (ref 150–400)
RBC: 4.14 MIL/uL — ABNORMAL LOW (ref 4.22–5.81)
RDW: 14.2 % (ref 11.5–15.5)
WBC: 11.7 K/uL — ABNORMAL HIGH (ref 4.0–10.5)
nRBC: 0 % (ref 0.0–0.2)

## 2024-06-01 LAB — BASIC METABOLIC PANEL WITH GFR
Anion gap: 9 (ref 5–15)
BUN: 13 mg/dL (ref 8–23)
CO2: 24 mmol/L (ref 22–32)
Calcium: 8.1 mg/dL — ABNORMAL LOW (ref 8.9–10.3)
Chloride: 103 mmol/L (ref 98–111)
Creatinine, Ser: 0.77 mg/dL (ref 0.61–1.24)
GFR, Estimated: >60 mL/min (ref >60)
Glucose, Bld: 120 mg/dL — ABNORMAL HIGH (ref 70–99)
Potassium: 4.2 mmol/L (ref 3.5–5.1)
Sodium: 136 mmol/L (ref 135–145)

## 2024-06-01 LAB — GLUCOSE, CAPILLARY: Glucose-Capillary: 127 mg/dL — ABNORMAL HIGH (ref 70–99)

## 2024-06-01 NOTE — Progress Notes (Addendum)
 3 Days Post-Op Procedure(s) (LRB): CORONARY ARTERY BYPASS GRAFTING (CABG) TIMES THREE USING LEFT INTERNAL MAMMARY ARTERY AND ENDOSCOPICALLY HARVESTED RIGHT GREATER SAPHENOUS VEIN (N/A) ECHOCARDIOGRAM, TRANSESOPHAGEAL (N/A) Subjective: Denies pain, somewhat weak, + BM, not SOB  Objective: Vital signs in last 24 hours: Temp:  [98 F (36.7 C)-98.7 F (37.1 C)] 98.7 F (37.1 C) (07/21 0410) Pulse Rate:  [80-100] 88 (07/21 0410) Cardiac Rhythm: Normal sinus rhythm (07/21 0350) Resp:  [14-52] 17 (07/21 0410) BP: (92-149)/(68-88) 132/76 (07/21 0410) SpO2:  [90 %-96 %] 94 % (07/21 0410) Weight:  [90.9 kg] 90.9 kg (07/21 0232)  Hemodynamic parameters for last 24 hours:    Intake/Output from previous day: 07/20 0701 - 07/21 0700 In: 350 [P.O.:350] Out: 845 [Urine:775; Chest Tube:70] Intake/Output this shift: No intake/output data recorded.  General appearance: alert, cooperative, and no distress Heart: regular rate and rhythm Lungs: min dim in bases Abdomen: benign Extremities: no edema Wound: dressings CDI  Lab Results: Recent Labs    05/31/24 0423 06/01/24 0402  WBC 15.3* 11.7*  HGB 13.3 12.2*  HCT 39.6 36.7*  PLT 138* 138*   BMET:  Recent Labs    05/31/24 0423 06/01/24 0402  NA 133* 136  K 4.3 4.2  CL 101 103  CO2 24 24  GLUCOSE 126* 120*  BUN 17 13  CREATININE 0.90 0.77  CALCIUM  8.1* 8.1*    PT/INR:  Recent Labs    05/29/24 1221  LABPROT 17.8*  INR 1.4*   ABG    Component Value Date/Time   PHART 7.352 05/29/2024 1747   HCO3 22.5 05/29/2024 1747   TCO2 24 05/29/2024 1747   ACIDBASEDEF 3.0 (H) 05/29/2024 1747   O2SAT 97 05/29/2024 1747   CBG (last 3)  Recent Labs    05/30/24 2322 05/31/24 0359 05/31/24 0812  GLUCAP 128* 127* 114*    Meds Scheduled Meds:  acetaminophen   1,000 mg Oral Q6H   Or   acetaminophen  (TYLENOL ) oral liquid 160 mg/5 mL  1,000 mg Per Tube Q6H   aspirin  EC  325 mg Oral Daily   Or   aspirin   324 mg Per Tube Daily    atorvastatin   80 mg Oral Daily   bisacodyl   10 mg Oral Daily   Or   bisacodyl   10 mg Rectal Daily   Chlorhexidine  Gluconate Cloth  6 each Topical Daily   docusate sodium   200 mg Oral Daily   enoxaparin  (LOVENOX ) injection  40 mg Subcutaneous QHS   ezetimibe   10 mg Oral Daily   metoprolol  tartrate  25 mg Oral BID   mupirocin  ointment  1 Application Nasal BID   pantoprazole   40 mg Oral Daily   sodium chloride  flush  3 mL Intravenous Q12H   Continuous Infusions:  sodium chloride      PRN Meds:.sodium chloride , metoprolol  tartrate, ondansetron  (ZOFRAN ) IV, mouth rinse, sodium chloride  flush, traMADol   Xrays DG Chest Port 1 View Result Date: 05/31/2024 CLINICAL DATA:  Status post CABG.  Evaluate for pneumothorax. EXAM: PORTABLE CHEST 1 VIEW COMPARISON:  05/30/2024 FINDINGS: There is been interval removal of the PA catheter. Stable position of mediastinal drain and left chest tube. No significant pneumothorax identified. Stable cardiomediastinal contours status post median sternotomy and CABG procedure. Lung volumes remain low with persistent mild bibasilar atelectasis. No frank interstitial edema or significant pleural effusion. IMPRESSION: 1. Interval removal of PA catheter. 2. Stable position of mediastinal drain and left chest tube. No significant pneumothorax identified. 3. Low lung volumes with persistent mild bibasilar  atelectasis. Electronically Signed   By: Waddell Calk M.D.   On: 05/31/2024 08:01    Assessment/Plan: S/P Procedure(s) (LRB): CORONARY ARTERY BYPASS GRAFTING (CABG) TIMES THREE USING LEFT INTERNAL MAMMARY ARTERY AND ENDOSCOPICALLY HARVESTED RIGHT GREATER SAPHENOUS VEIN (N/A) ECHOCARDIOGRAM, TRANSESOPHAGEAL (N/A) POD#3  1 afeb, s BP 90's-140's, sinus rhythm, on ASA, statin and Beta blocker 2 O2 sats good on 2 liters [place] 3 voiding , not all measured, wt approx 3 kg > preop 4 CBG's well controlled- not a diabetic 5 normal renal fxn, lytes 6 leukocytosis trend conts  to improve, almost normal 7 minor expected ABLA 8 thrombocytopenia- minor, stable, likely reactive 9 lovenox  for DVT ppx,  10 routine pulm hygiene and rehab, hopefully home 1-2 days  LOS: 5 days    Lemond FORBES Cera PA-C Pager [phone] 06/01/2024 Patient seen and examined, agree with above Looks great Home in AM if no issues overnight  Elspeth C. Kerrin, MD [facility] [phone]

## 2024-06-01 NOTE — Progress Notes (Signed)
 Mobility Specialist Progress Note:    06/01/24 0905  Mobility  Activity Ambulated with assistance in hallway;Ambulated with assistance in room  Level of Assistance Minimal assist, patient does 75% or more  Assistive Device Front wheel walker  Distance Ambulated (ft) 470 ft  RUE Weight Bearing Per Provider Order NWB  LUE Weight Bearing Per Provider Order NWB  Activity Response Tolerated well  Mobility Referral Yes  Mobility visit 1 Mobility  Mobility Specialist Start Time (ACUTE ONLY) [id number]  Mobility Specialist Stop Time (ACUTE ONLY) 0917  Mobility Specialist Time Calculation (min) (ACUTE ONLY) 12 min   Pt received in bed, eager for mobility session. Ambulated with RW in hallway, SV during session. MinA to sit EOB with sternal precautions. Tolerated well, VSS throughout. Returned pt to room, wife at bedside, all needs met.    Tytionna Cloyd Mobility Specialist Please contact via Special educational needs teacher or  Rehab office at [phone]

## 2024-06-01 NOTE — Discharge Summary (Signed)
 [address] [place] [address]             [phone]        Physician Discharge Summary  Patient ID: [name]. MRN: [id number] DOB/AGE: [birth year] 73 y.o.  Admit date: 05/25/2024 Discharge date: 06/02/2024  Admission Diagnoses:  Patient Active Problem List   Diagnosis Date Noted   S/P CABG x 3 05/29/2024   Elevated blood pressure reading 05/26/2024   Overweight 05/26/2024   NSTEMI (non-ST elevated myocardial infarction) (HCC) 05/25/2024   Postop check 09/29/2013   Postoperative hematoma 08/24/2013   Recurrent right inguinal hernia 08/04/2013   Cholelithiasis 03/05/2013   Biliary colic 03/02/2013   Transaminitis 03/02/2013   ADD (attention deficit disorder)    HLD (hyperlipidemia)    Hypogonadism male    Nephrolithiasis      Discharge Diagnoses:  Patient Active Problem List   Diagnosis Date Noted   S/P CABG x 3 05/29/2024   Elevated blood pressure reading 05/26/2024   Overweight 05/26/2024   NSTEMI (non-ST elevated myocardial infarction) (HCC) 05/25/2024   Postop check 09/29/2013   Postoperative hematoma 08/24/2013   Recurrent right inguinal hernia 08/04/2013   Cholelithiasis 03/05/2013   Biliary colic 03/02/2013   Transaminitis 03/02/2013   ADD (attention deficit disorder)    HLD (hyperlipidemia)    Hypogonadism male    Nephrolithiasis      Discharged Condition: good   Referring: No ref. provider found Primary Care: Duanne Butler DASEN, MD Primary Cardiologist:Peter Swaziland, MD     History of Present Illness:  At time of CT surgical consultation:   The patient is a 73 year old male we are asked to see in cardiothoracic surgical consultation for consideration of CABG.  Patient presented to the emergency department with complaints of chest tightness and fatigue.  The pain has been persistent for approximately 2 weeks.  He did not radiate.  He initially managed the symptoms with aspirin .  When the symptoms began  he notes significantly more fatigue and describes a lack of energy and feeling more tired than normal.  He denies palpitations.  He does not have lower extremity edema.  He denies shortness of breath or orthopnea.  Cardiac risk factors include hyperlipidemia.  High-sensitivity troponin T's were elevated peaking at 60 ruling in for non-STEMI.  He did have some subtle ST changes inferiorly on EKG.  He is a non-smoker.  His BMI is 29.  He is not not anemic and has normal renal function.  He has no history of diabetes.  Echocardiogram and cardiac catheterization have been performed and results are as described below.  He has significant left main lesion which is 70% stenosed.  He has an ostial circumflex to proximal circumflex lesion at 90% stenosis.  Additional significant lesions are a 70% LAD stenosis and a 85% ramus gnosis.  Ejection fraction is noted to be normal by echocardiogram.  He has no significant valvular findings.  On cath he did have mildly elevated LVEDP at 17 mmHg.  Following patient reviewed and evaluation of all of the studies Dr. Kerrin recommended proceeding with CABG.  Hospital course: Following full diagnostic evaluation and medical stabilization it was felt the patient could proceed and on 05/29/2024 he was taken the operating room at which time he underwent CABG x 3.  A LIMA-LAD, SVG-diagonal and SVG-OM graft was placed.  The patient tolerated procedure well was taken to the surgical intensive care unit in  stable condition.  The patient was extubated the evening of surgery.  He underwent removal of arterial lines, swan ganz catheter, pacing wires and chest tubes without difficulty.  He was maintaining NSR and felt stable for transfer to the progressive care unit on 05/31/2024.  He has continued to make good progress in his rehab.  Incisions are healing well without evidence of infection.  He is tolerated advancing diet in a routine manner.  He does have a very mild expected acute blood  loss anemia which is stable.  Renal function has remained normal.  He has required some routine diuresis.  Started baby aspirin  as well as Plavix  for STEMI.  He is still on aspirin  and beta-blocker.  Blood pressure has been somewhat variable with occasional readings but the vast majority have been either in the normal or lower range.  He has not been started on an ACE inhibitor or ARB.  Overall, at the time of discharge the patient is felt to be quite stable.  Consults: cardiology  Significant Diagnostic Studies:  DG Chest Port 1 View Result Date: 05/31/2024 CLINICAL DATA:  Status post CABG.  Evaluate for pneumothorax. EXAM: PORTABLE CHEST 1 VIEW COMPARISON:  05/30/2024 FINDINGS: There is been interval removal of the PA catheter. Stable position of mediastinal drain and left chest tube. No significant pneumothorax identified. Stable cardiomediastinal contours status post median sternotomy and CABG procedure. Lung volumes remain low with persistent mild bibasilar atelectasis. No frank interstitial edema or significant pleural effusion. IMPRESSION: 1. Interval removal of PA catheter. 2. Stable position of mediastinal drain and left chest tube. No significant pneumothorax identified. 3. Low lung volumes with persistent mild bibasilar atelectasis. Electronically Signed   By: Waddell Calk M.D.   On: 05/31/2024 08:01   DG Chest Port 1 View Result Date: 05/30/2024 CLINICAL DATA:  73 year old male status post CABG postoperative day 1. EXAM: PORTABLE CHEST 1 VIEW COMPARISON:  Portable chest yesterday and earlier. FINDINGS: Portable AP semi upright view at 0557 hours extubated. No enteric tube identified. Right IJ Swan-Ganz catheter with tip now at the right hilum. Mediastinal and left chest tube remain in place. Lower lung volumes. Stable cardiac size and mediastinal contours. No pneumothorax or pulmonary edema identified. Mildly increased hilar and lung base atelectasis. Paucity of bowel gas.  Stable visualized  osseous structures. IMPRESSION: 1. Extubated with lower lung volumes and increased atelectasis. No pneumothorax or pulmonary edema. 2. Right IJ Swan-Ganz catheter with tip now at the right hilum. Mediastinal and left chest tube remain in place. Electronically Signed   By: VEAR Hurst M.D.   On: 05/30/2024 08:26   DG Chest Port 1 View Result Date: 05/29/2024 CLINICAL DATA:  Status post CABG. EXAM: PORTABLE CHEST 1 VIEW COMPARISON:  X-ray 05/25/2024 FINDINGS: Sternal wires. Numerous tubes and lines now seen. ET tube seen with tip proximally 4 cm above the carina. Enteric tube extending mid I a frame. Right IJ Swan-Ganz catheter with tip overlying the right pulmonary artery. Mediastinal drain and left chest tube. No consolidation, pneumothorax or effusion. No edema. Only mild left retrocardiac opacity. Normal cardiopericardial silhouette. Overlapping cardiac leads. IMPRESSION: Status post median sternotomy. Numerous tubes and lines. No pneumothorax or edema. Mild left retrocardiac opacity.  Recommend follow-up. Electronically Signed   By: Ranell Bring M.D.   On: 05/29/2024 12:28   VAS US  DOPPLER PRE CABG Result Date: 05/27/2024 PREOPERATIVE VASCULAR EVALUATION Patient Name:  [name].  Date of Exam:   05/27/2024 Medical Rec #: [id number]  Accession #:    [id number] Date of Birth: May 07, 1951               Patient Gender: M Patient Age:   72 years Exam Location:  [facility] Procedure:      VAS US  DOPPLER PRE CABG Referring Phys: STEVEN HENDRICKSON --------------------------------------------------------------------------------  Indications:  Pre-CABG. Risk Factors: Hyperlipidemia. Performing Technologist: Jimmye Scarce RVT  Examination Guidelines: A complete evaluation includes B-mode imaging, spectral Doppler, color Doppler, and power Doppler as needed of all accessible portions of each vessel. Bilateral testing is considered an integral part of a complete examination. Limited  examinations for reoccurring indications may be performed as noted.  Right Carotid Findings: +----------+--------+--------+--------+--------+------------------+           PSV cm/sEDV cm/sStenosisDescribeComments           +----------+--------+--------+--------+--------+------------------+ CCA Prox  93      20                                         +----------+--------+--------+--------+--------+------------------+ CCA Distal95      22                      intimal thickening +----------+--------+--------+--------+--------+------------------+ ICA Prox  72      18      1-39%                              +----------+--------+--------+--------+--------+------------------+ ICA Distal58      21                                         +----------+--------+--------+--------+--------+------------------+ ECA       77                                                 +----------+--------+--------+--------+--------+------------------+ +----------+--------+-------+----------------+------------+           PSV cm/sEDV cmsDescribe        Arm Pressure +----------+--------+-------+----------------+------------+ Subclavian114            Multiphasic, WNL             +----------+--------+-------+----------------+------------+ +---------+--------+--+--------+-+---------+ VertebralPSV cm/s28EDV cm/s8Antegrade +---------+--------+--+--------+-+---------+ Left Carotid Findings: +----------+--------+--------+--------+--------+------------------+           PSV cm/sEDV cm/sStenosisDescribeComments           +----------+--------+--------+--------+--------+------------------+ CCA Prox  87      16                                         +----------+--------+--------+--------+--------+------------------+ CCA Distal80      19                      intimal thickening +----------+--------+--------+--------+--------+------------------+ ICA Prox  52      17      1-39%                               +----------+--------+--------+--------+--------+------------------+ ICA Distal56      19                                         +----------+--------+--------+--------+--------+------------------+  ECA       79                                                 +----------+--------+--------+--------+--------+------------------+ +----------+--------+--------+----------------+------------+ SubclavianPSV cm/sEDV cm/sDescribe        Arm Pressure +----------+--------+--------+----------------+------------+           103             Multiphasic, WNL             +----------+--------+--------+----------------+------------+ +---------+--------+--+--------+-+---------+ VertebralPSV cm/s45EDV cm/s9Antegrade +---------+--------+--+--------+-+---------+  ABI Findings: +------------------+-----+---------+ Rt Pressure (mmHg)IndexWaveform  +------------------+-----+---------+ 112                    triphasic +------------------+-----+---------+ 166               1.48 triphasic +------------------+-----+---------+ 151               1.35 triphasic +------------------+-----+---------+ +------------------+-----+---------+-------+ Lt Pressure (mmHg)IndexWaveform Comment +------------------+-----+---------+-------+                        triphasicIV      +------------------+-----+---------+-------+ 149               1.33 triphasic        +------------------+-----+---------+-------+ 146               1.30 triphasic        +------------------+-----+---------+-------+ +-------+---------------+ ABI/TBIToday's ABI/TBI +-------+---------------+ Right  1.48/1.04       +-------+---------------+ Left   1.33/1.06       +-------+---------------+  Right Doppler Findings: +--------+--------+---------+ Site    PressureDoppler   +--------+--------+---------+ [id number]     triphasic +--------+--------+---------+ Radial          triphasic  +--------+--------+---------+ Ulnar           biphasic  +--------+--------+---------+  Left Doppler Findings: +--------+---------+--------+ Site    Doppler  Comments +--------+---------+--------+ BrachialtriphasicIV       +--------+---------+--------+ Radial  triphasic         +--------+---------+--------+ Ulnar   biphasic          +--------+---------+--------+   Summary: Right Carotid: Velocities in the right ICA are consistent with a 1-39% stenosis. Left Carotid: Velocities in the left ICA are consistent with a 1-39% stenosis. Vertebrals:  Bilateral vertebral arteries demonstrate antegrade flow. Subclavians: Normal flow hemodynamics were seen in bilateral subclavian              arteries. Right ABI: The right toe-brachial index is normal. Falsely elevated ABI. Left ABI: Falsely elevated ABI. Right Upper Extremity: Doppler waveform obliterate with right radial compression. Doppler waveforms remain within normal limits with right ulnar compression. Left Upper Extremity: Doppler waveform obliterate with left radial compression. Doppler waveforms remain within normal limits with left ulnar compression.  Electronically signed by Gaile New MD on 05/27/2024 at 5:11:16 PM.    Final    CARDIAC CATHETERIZATION Result Date: 05/27/2024   Mid LM to Dist LM lesion is 70% stenosed.   Ost Cx to Prox Cx lesion is 90% stenosed.   Mid LAD lesion is 70% stenosed.   1st Diag lesion is 30% stenosed.   2nd Diag lesion is 30% stenosed.   Ramus lesion is 85% stenosed.   2nd Mrg lesion is 40% stenosed. 1.  Moderately calcified coronary arteries with significant two-vessel and distal left main  disease. 2.  Left ventricular angiography was not performed.  EF was normal by echo. 3.  Mildly elevated left ventricular end-diastolic pressure at 17 mmHg. Recommendations: Distal left main stenosis extending into the ostium of left circumflex in addition to significant LAD and ramus disease. Recommend evaluation for CABG.  Resume heparin  drip 2 hours after TR band removal.   ECHOCARDIOGRAM COMPLETE Result Date: 05/26/2024    ECHOCARDIOGRAM REPORT   Patient Name:   [name]. Date of Exam: 05/26/2024 Medical Rec #:  [id number]             Height:       69.0 in Accession #:    [id number]            Weight:       196.6 lb Date of Birth:  11/23/1950              BSA:          2.051 m Patient Age:    73 years              BP:           136/94 mmHg Patient Gender: M                     HR:           78 bpm. Exam Location:  Inpatient Procedure: 2D Echo, Color Doppler and Cardiac Doppler (Both Spectral and Color            Flow Doppler were utilized during procedure). Indications:    Chest Pain  History:        Patient has no prior history of Echocardiogram examinations.                 Signs/Symptoms:Chest Pain; Risk Factors:Dyslipidemia.  Sonographer:    Koleen Popper RDCS Referring Phys: [phone] RONDELL A SMITH IMPRESSIONS  1. Left ventricular ejection fraction, by estimation, is 60 to 65%. The left ventricle has normal function. The left ventricle has no regional wall motion abnormalities. There is mild left ventricular hypertrophy of the basal-septal segment. Left ventricular diastolic parameters are consistent with Grade I diastolic dysfunction (impaired relaxation).  2. Right ventricular systolic function is normal. The right ventricular size is normal. Tricuspid regurgitation signal is inadequate for assessing PA pressure.  3. The mitral valve is normal in structure. No evidence of mitral valve regurgitation. No evidence of mitral stenosis.  4. The aortic valve is tricuspid. Aortic valve regurgitation is not visualized. No aortic stenosis is present. FINDINGS  Left Ventricle: Left ventricular ejection fraction, by estimation, is 60 to 65%. The left ventricle has normal function. The left ventricle has no regional wall motion abnormalities. The left ventricular internal cavity size was normal in size. There is  mild left  ventricular hypertrophy of the basal-septal segment. Left ventricular diastolic parameters are consistent with Grade I diastolic dysfunction (impaired relaxation). Normal left ventricular filling pressure. Right Ventricle: The right ventricular size is normal. Right vetricular wall thickness was not well visualized. Right ventricular systolic function is normal. Tricuspid regurgitation signal is inadequate for assessing PA pressure. Left Atrium: Left atrial size was normal in size. Right Atrium: Right atrial size was normal in size. Pericardium: There is no evidence of pericardial effusion. Mitral Valve: The mitral valve is normal in structure. No evidence of mitral valve regurgitation. No evidence of mitral valve stenosis. Tricuspid Valve: The tricuspid valve is normal in structure. Tricuspid valve regurgitation is not  demonstrated. Aortic Valve: The aortic valve is tricuspid. Aortic valve regurgitation is not visualized. No aortic stenosis is present. Pulmonic Valve: The pulmonic valve was grossly normal. Pulmonic valve regurgitation is trivial. No evidence of pulmonic stenosis. Aorta: The aortic root and ascending aorta are structurally normal, with no evidence of dilitation. IAS/Shunts: No atrial level shunt detected by color flow Doppler.  LEFT VENTRICLE PLAX 2D LVIDd:         4.00 cm   Diastology LVIDs:         2.60 cm   LV e' medial:    6.20 cm/s LV PW:         1.10 cm   LV E/e' medial:  9.6 LV IVS:        1.40 cm   LV e' lateral:   7.62 cm/s LVOT diam:     2.10 cm   LV E/e' lateral: 7.8 LV SV:         52 LV SV Index:   25 LVOT Area:     3.46 cm  RIGHT VENTRICLE             IVC RV S prime:     12.40 cm/s  IVC diam: 2.00 cm TAPSE (M-mode): 2.6 cm LEFT ATRIUM             Index        RIGHT ATRIUM           Index LA diam:        3.40 cm 1.66 cm/m   RA Area:     13.70 cm LA Vol (A2C):   28.5 ml 13.89 ml/m  RA Volume:   35.20 ml  17.16 ml/m LA Vol (A4C):   35.8 ml 17.45 ml/m LA Biplane Vol: 34.6 ml 16.87  ml/m  AORTIC VALVE LVOT Vmax:   70.30 cm/s LVOT Vmean:  50.800 cm/s LVOT VTI:    0.151 m  AORTA Ao Root diam: 3.40 cm Ao Asc diam:  3.70 cm MITRAL VALVE MV Area (PHT): 3.91 cm    SHUNTS MV Decel Time: 194 msec    Systemic VTI:  0.15 m MV E velocity: 59.70 cm/s  Systemic Diam: 2.10 cm MV A velocity: 91.20 cm/s MV E/A ratio:  0.65 Mihai Croitoru MD Electronically signed by Jerel Balding MD Signature Date/Time: 05/26/2024/5:35:11 PM    Final    DG Chest 2 View Result Date: 05/25/2024 CLINICAL DATA:  Chest pain.  Fatigue.  Dyspnea on exertion. EXAM: CHEST - 2 VIEW COMPARISON:  08/12/2008 FINDINGS: Cardiomediastinal silhouette and pulmonary vasculature are within normal limits. Lungs are clear. Advanced degenerative changes of the RIGHT glenohumeral joint. Surgical anchor seen in the RIGHT humeral head. IMPRESSION: No acute cardiopulmonary process. Electronically Signed   By: Aliene Lloyd M.D.   On: 05/25/2024 12:23    DG Chest Port 1 View Result Date: 05/31/2024 CLINICAL DATA:  Status post CABG.  Evaluate for pneumothorax. EXAM: PORTABLE CHEST 1 VIEW COMPARISON:  05/30/2024 FINDINGS: There is been interval removal of the PA catheter. Stable position of mediastinal drain and left chest tube. No significant pneumothorax identified. Stable cardiomediastinal contours status post median sternotomy and CABG procedure. Lung volumes remain low with persistent mild bibasilar atelectasis. No frank interstitial edema or significant pleural effusion. IMPRESSION: 1. Interval removal of PA catheter. 2. Stable position of mediastinal drain and left chest tube. No significant pneumothorax identified. 3. Low lung volumes with persistent mild bibasilar atelectasis. Electronically Signed   By: Waddell Joan HERO.D.  On: 05/31/2024 08:01   DG Chest Port 1 View Result Date: 05/30/2024 CLINICAL DATA:  73 year old male status post CABG postoperative day 1. EXAM: PORTABLE CHEST 1 VIEW COMPARISON:  Portable chest yesterday and  earlier. FINDINGS: Portable AP semi upright view at 0557 hours extubated. No enteric tube identified. Right IJ Swan-Ganz catheter with tip now at the right hilum. Mediastinal and left chest tube remain in place. Lower lung volumes. Stable cardiac size and mediastinal contours. No pneumothorax or pulmonary edema identified. Mildly increased hilar and lung base atelectasis. Paucity of bowel gas.  Stable visualized osseous structures. IMPRESSION: 1. Extubated with lower lung volumes and increased atelectasis. No pneumothorax or pulmonary edema. 2. Right IJ Swan-Ganz catheter with tip now at the right hilum. Mediastinal and left chest tube remain in place. Electronically Signed   By: VEAR Hurst M.D.   On: 05/30/2024 08:26     Results for orders placed or performed during the hospital encounter of 05/25/24 (from the past 48 hours)  CBC     Status: Abnormal   Collection Time: 06/01/24  4:02 AM  Result Value Ref Range   WBC 11.7 (H) 4.0 - 10.5 K/uL   RBC 4.14 (L) 4.22 - 5.81 MIL/uL   Hemoglobin 12.2 (L) 13.0 - 17.0 g/dL   HCT 63.2 (L) 60.9 - 47.9 %   MCV 88.6 80.0 - 100.0 fL   MCH 29.5 26.0 - 34.0 pg   MCHC 33.2 30.0 - 36.0 g/dL   RDW 85.7 88.4 - 84.4 %   Platelets 138 (L) 150 - 400 K/uL   nRBC 0.0 0.0 - 0.2 %    Comment: Performed at [facility], [address] [place], [place] [address]  Basic metabolic panel     Status: Abnormal   Collection Time: 06/01/24  4:02 AM  Result Value Ref Range   Sodium 136 135 - 145 mmol/L   Potassium 4.2 3.5 - 5.1 mmol/L   Chloride 103 98 - 111 mmol/L   CO2 24 22 - 32 mmol/L   Glucose, Bld 120 (H) 70 - 99 mg/dL    Comment: Glucose reference range applies only to samples taken after fasting for at least 8 hours.   BUN 13 8 - 23 mg/dL   Creatinine, Ser 9.22 0.61 - 1.24 mg/dL   Calcium  8.1 (L) 8.9 - 10.3 mg/dL   GFR, Estimated >39 >39 mL/min    Comment: (NOTE) Calculated using the CKD-EPI Creatinine Equation (2021)    Anion gap 9 5 - 15    Comment:  Performed at [facility], [address] [place], [place] [address]  Glucose, capillary     Status: Abnormal   Collection Time: 06/01/24  6:16 PM  Result Value Ref Range   Glucose-Capillary 127 (H) 70 - 99 mg/dL    Comment: Glucose reference range applies only to samples taken after fasting for at least 8 hours.    Treatments: surgery:    Operative Report    DATE OF PROCEDURE: 05/29/2024   PREOPERATIVE DIAGNOSIS:  Severe two-vessel coronary artery disease status post non-ST elevation myocardial infarction.   POSTOPERATIVE DIAGNOSIS:  Severe two-vessel coronary artery disease status post non-ST elevation myocardial infarction.   PROCEDURES PERFORMED:  Median sternotomy, intracorporeal circulation, coronary artery bypass grafting x 3 (left internal mammary artery to LAD, saphenous vein graft to second diagonal, saphenous vein graft to obtuse marginal 1).  Endoscopic vein harvest,  right thigh.   SURGEON:  Elspeth BROCKS. Kerrin, MD.  ASSISTANT:  Lemond Cera, PA.  Discharge Exam:   General appearance: alert, cooperative, and no distress Heart: regular rate and rhythm Lungs: mildly dim in bases Abdomen: benign Extremities: no edema or calf tenderness Wound: incis healing well Discharge Medications:  The patient has been discharged on:   1.Beta Blocker:  Yes [  y ]                              No   [   ]                              If No, reason:  2.Ace Inhibitor/ARB: Yes [   ]                                     No  [ n   ]                                     If No, reason:labile BP  3.Statin:   Yes [ y  ]                  No  [   ]                  If No, reason:  4.Ecasa:  Yes  [ y  ]                  No   [   ]                  If No, reason:  Patient had ACS upon admission:y  Plavix /P2Y12 inhibitor: Yes [ y  ]                                      No  [   ]     Discharge Instructions     AMB Referral to Cardiac Rehabilitation - Phase II    Complete by: As directed    Diagnosis: CABG   CABG X ___: 3   After initial evaluation and assessments completed: Virtual Based Care may be provided alone or in conjunction with Phase 2 Cardiac Rehab based on patient barriers.: Yes   Intensive Cardiac Rehabilitation (ICR) [facility] location only OR Traditional Cardiac Rehabilitation (TCR) *If criteria for ICR are not met will enroll in TCR ([facility] only): Yes   AMB referral to Phase II Cardiac Rehabilitation   Complete by: As directed    Diagnosis: NSTEMI   After initial evaluation and assessments completed: Virtual Based Care may be provided alone or in conjunction with Phase 2 Cardiac Rehab based on patient barriers.: Yes   Intensive Cardiac Rehabilitation (ICR) [facility] location only OR Traditional Cardiac Rehabilitation (TCR) *If criteria for ICR are not met will enroll in TCR ([facility] only): Yes      Allergies as of 06/02/2024       Reactions   Codeine Itching   Morphine  And Codeine Other (See Comments)   Doesn't work   Valium [diazepam] Other (See Comments)   Pt states it jacks him up  Medication List     STOP taking these medications    pravastatin  40 MG tablet Commonly known as: PRAVACHOL        TAKE these medications    aspirin  EC 81 MG tablet Take 1 tablet (81 mg total) by mouth daily. Swallow whole. Start taking on: June 03, 2024   atorvastatin  80 MG tablet Commonly known as: LIPITOR  Take 1 tablet (80 mg total) by mouth daily. Start taking on: June 03, 2024   clopidogrel  75 MG tablet Commonly known as: PLAVIX  Take 1 tablet (75 mg total) by mouth daily. Start taking on: June 03, 2024   ezetimibe  10 MG tablet Commonly known as: ZETIA  Take 1 tablet (10 mg total) by mouth daily. Start taking on: June 03, 2024   metoprolol  tartrate 25 MG tablet Commonly known as: LOPRESSOR  Take 1 tablet (25 mg total) by mouth 2 (two) times daily.   traMADol  50 MG tablet Commonly known as: ULTRAM  Take 1 tablet (50 mg total)  by mouth every 6 (six) hours as needed for moderate pain (pain score 4-6).        Follow-up Information     Kerrin Elspeth BROCKS, MD Follow up.   Specialty: Cardiothoracic Surgery Why: Please see your discharge paperwork for details of follow-up appointment with Dr. Kerrin.  On the date you are scheduled to see Dr. Kerrin you will obtain a chest x-ray 1 hour prior to that appointment.  This will be done in the same office building. Contact information: [address] [place] [address] [phone]         [place], Peter M, MD Follow up.   Specialty: Cardiology Why: Please see your discharge paperwork for details of follow-up appointment with cardiology.  Your first appointment may be with a physician assistant or nurse practitioner. Contact information: [address] [place] [address] [phone]                 Signed:  Lemond FORBES Cera, PA-C  06/02/2024, 12:15 PM

## 2024-06-01 NOTE — Care Management Important Message (Signed)
 Important Message  Patient Details  Name: [name]. MRN: [id number] Date of Birth: 1951-03-01   Important Message Given:  Yes - Medicare IM     Claretta Deed 06/01/2024, 4:22 PM

## 2024-06-01 NOTE — Progress Notes (Addendum)
 RN called to patient's room by patient's wife.  Patient's wife reporting that patient is very disoriented after waking from a long nap.  Patient's vitals are stable.  Patient is able to be reoriented with questioning and is A&O x4 after a few minutes.  Patient's strength is good and movements are symmetrical.  Patient ambulates to bathroom with minimal assist. Patient and wife also reporting that patient was hearing music earlier that no one else could hear.  No music being heard currently.

## 2024-06-02 ENCOUNTER — Other Ambulatory Visit (HOSPITAL_COMMUNITY): Payer: Self-pay

## 2024-06-02 MED ORDER — TRAMADOL HCL 50 MG PO TABS
50.0000 mg | ORAL_TABLET | Freq: Four times a day (QID) | ORAL | 0 refills | Status: DC | PRN
  Filled 2024-06-02: qty 28, 7d supply, fill #0

## 2024-06-02 MED ORDER — ATORVASTATIN CALCIUM 80 MG PO TABS
80.0000 mg | ORAL_TABLET | Freq: Every day | ORAL | 1 refills | Status: DC
  Filled 2024-06-02: qty 30, 30d supply, fill #0

## 2024-06-02 MED ORDER — EZETIMIBE 10 MG PO TABS
10.0000 mg | ORAL_TABLET | Freq: Every day | ORAL | 1 refills | Status: DC
  Filled 2024-06-02 – 2024-06-23 (×2): qty 30, 30d supply, fill #0

## 2024-06-02 MED ORDER — METOPROLOL TARTRATE 25 MG PO TABS
25.0000 mg | ORAL_TABLET | Freq: Two times a day (BID) | ORAL | 1 refills | Status: DC
  Filled 2024-06-02: qty 60, 30d supply, fill #0

## 2024-06-02 MED ORDER — CLOPIDOGREL BISULFATE 75 MG PO TABS
75.0000 mg | ORAL_TABLET | Freq: Every day | ORAL | Status: DC
  Administered 2024-06-02: 75 mg via ORAL
  Filled 2024-06-02: qty 1

## 2024-06-02 MED ORDER — ASPIRIN 81 MG PO TBEC: 81.0000 mg | DELAYED_RELEASE_TABLET | Freq: Every day | ORAL | Status: DC

## 2024-06-02 MED ORDER — CLOPIDOGREL BISULFATE 75 MG PO TABS
75.0000 mg | ORAL_TABLET | Freq: Every day | ORAL | 1 refills | Status: DC
  Filled 2024-06-02: qty 30, 30d supply, fill #0

## 2024-06-02 MED ORDER — ASPIRIN 81 MG PO TBEC
81.0000 mg | DELAYED_RELEASE_TABLET | Freq: Every day | ORAL | Status: DC
  Administered 2024-06-02: 81 mg via ORAL
  Filled 2024-06-02: qty 1

## 2024-06-02 MED FILL — Mannitol IV Soln 20%: INTRAVENOUS | Qty: 500 | Status: AC

## 2024-06-02 MED FILL — Sodium Chloride IV Soln 0.9%: INTRAVENOUS | Qty: 2000 | Status: AC

## 2024-06-02 MED FILL — Lidocaine HCl Local Soln Prefilled Syringe 100 MG/5ML (2%): INTRAMUSCULAR | Qty: 5 | Status: AC

## 2024-06-02 MED FILL — Heparin Sodium (Porcine) Inj 1000 Unit/ML: INTRAMUSCULAR | Qty: 30 | Status: AC

## 2024-06-02 MED FILL — Sodium Bicarbonate IV Soln 8.4%: INTRAVENOUS | Qty: 50 | Status: AC

## 2024-06-02 MED FILL — Electrolyte-R (PH 7.4) Solution: INTRAVENOUS | Qty: 4000 | Status: AC

## 2024-06-02 NOTE — Progress Notes (Addendum)
 4 Days Post-Op Procedure(s) (LRB): CORONARY ARTERY BYPASS GRAFTING (CABG) TIMES THREE USING LEFT INTERNAL MAMMARY ARTERY AND ENDOSCOPICALLY HARVESTED RIGHT GREATER SAPHENOUS VEIN (N/A) ECHOCARDIOGRAM, TRANSESOPHAGEAL (N/A) Subjective: Had a bad night, not sleeping well, some confusion- appears resolved, no other neuro sx  Objective: Vital signs in last 24 hours: Temp:  [98.1 F (36.7 C)-99.5 F (37.5 C)] 98.7 F (37.1 C) (07/22 0417) Pulse Rate:  [90-95] 92 (07/22 0417) Cardiac Rhythm: Normal sinus rhythm (07/21 2016) Resp:  [19-20] 20 (07/22 0417) BP: (103-133)/(69-81) 111/77 (07/22 0417) SpO2:  [91 %-98 %] 92 % (07/22 0417)  Hemodynamic parameters for last 24 hours:    Intake/Output from previous day: No intake/output data recorded. Intake/Output this shift: No intake/output data recorded.  General appearance: alert, cooperative, and no distress Heart: regular rate and rhythm Lungs: mildly dim in bases Abdomen: benign Extremities: no edema or calf tenderness Wound: incis healing well  Lab Results: Recent Labs    05/31/24 0423 06/01/24 0402  WBC 15.3* 11.7*  HGB 13.3 12.2*  HCT 39.6 36.7*  PLT 138* 138*   BMET:  Recent Labs    05/31/24 0423 06/01/24 0402  NA 133* 136  K 4.3 4.2  CL 101 103  CO2 24 24  GLUCOSE 126* 120*  BUN 17 13  CREATININE 0.90 0.77  CALCIUM  8.1* 8.1*    PT/INR: No results for input(s): LABPROT, INR in the last 72 hours. ABG    Component Value Date/Time   PHART 7.352 05/29/2024 1747   HCO3 22.5 05/29/2024 1747   TCO2 24 05/29/2024 1747   ACIDBASEDEF 3.0 (H) 05/29/2024 1747   O2SAT 97 05/29/2024 1747   CBG (last 3)  Recent Labs    05/31/24 0359 05/31/24 0812 06/01/24 1816  GLUCAP 127* 114* 127*    Meds Scheduled Meds:  acetaminophen   1,000 mg Oral Q6H   Or   acetaminophen  (TYLENOL ) oral liquid 160 mg/5 mL  1,000 mg Per Tube Q6H   aspirin  EC  325 mg Oral Daily   Or   aspirin   324 mg Per Tube Daily    atorvastatin   80 mg Oral Daily   bisacodyl   10 mg Oral Daily   Or   bisacodyl   10 mg Rectal Daily   Chlorhexidine  Gluconate Cloth  6 each Topical Daily   docusate sodium   200 mg Oral Daily   enoxaparin  (LOVENOX ) injection  40 mg Subcutaneous QHS   ezetimibe   10 mg Oral Daily   metoprolol  tartrate  25 mg Oral BID   mupirocin  ointment  1 Application Nasal BID   pantoprazole   40 mg Oral Daily   sodium chloride  flush  3 mL Intravenous Q12H   Continuous Infusions: PRN Meds:.metoprolol  tartrate, ondansetron  (ZOFRAN ) IV, mouth rinse, sodium chloride  flush, traMADol   Xrays No results found.  Assessment/Plan: S/P Procedure(s) (LRB): CORONARY ARTERY BYPASS GRAFTING (CABG) TIMES THREE USING LEFT INTERNAL MAMMARY ARTERY AND ENDOSCOPICALLY HARVESTED RIGHT GREATER SAPHENOUS VEIN (N/A) ECHOCARDIOGRAM, TRANSESOPHAGEAL (N/A) POD#4  1 Tmax 99.5, VSS, sinus rhythm, on asa , statin and beta blocker 2 sats good on RA 3 BS adeq control 4 no new labs or xrays 5 weight about 1 kg > preop 6 Conts pulm hygiene and rehab 7 poss d/c, will d/w MD   LOS: 6 days    Lemond FORBES Cera PA-C Pager [phone] 06/02/2024 Patient seen and examined, agree with above Some confusion over night but appropriate this AM Will see how he does this morning and probably later around noon  Elspeth  KYM Millers, MD [facility] [phone]

## 2024-06-02 NOTE — Progress Notes (Signed)
 Chest tube sutures removed per MD order without difficulty.

## 2024-06-02 NOTE — Progress Notes (Signed)
 CARDIAC REHAB PHASE I   1325-1400  Discussed sternal precautions. Exercise guidelines end point of exercise. Patient was given heart healthy diet information. Patient is interested in outpatient phase 2 cardiac rehab. Reviewed sternal precautions and use of incentive spirometer.  Hadassah Elpidio Quan RN

## 2024-06-02 NOTE — Discharge Instructions (Signed)

## 2024-06-02 NOTE — Anesthesia Postprocedure Evaluation (Signed)
 Anesthesia Post Note  Patient: [name].  Procedure(s) Performed: CORONARY ARTERY BYPASS GRAFTING (CABG) TIMES THREE USING LEFT INTERNAL MAMMARY ARTERY AND ENDOSCOPICALLY HARVESTED RIGHT GREATER SAPHENOUS VEIN (Chest) ECHOCARDIOGRAM, TRANSESOPHAGEAL     Patient location during evaluation: SICU Anesthesia Type: General Level of consciousness: sedated Pain management: pain level controlled Vital Signs Assessment: post-procedure vital signs reviewed and stable Respiratory status: patient remains intubated per anesthesia plan Cardiovascular status: stable Postop Assessment: no apparent nausea or vomiting Anesthetic complications: no   No notable events documented.                Mkenzie Dotts

## 2024-06-08 MED FILL — Magnesium Sulfate Inj 50%: INTRAMUSCULAR | Qty: 10 | Status: AC

## 2024-06-08 MED FILL — Heparin Sodium (Porcine) Inj 1000 Unit/ML: Qty: 1000 | Status: AC

## 2024-06-08 MED FILL — Potassium Chloride Inj 2 mEq/ML: INTRAVENOUS | Qty: 40 | Status: AC

## 2024-06-10 ENCOUNTER — Telehealth (HOSPITAL_COMMUNITY): Payer: Self-pay

## 2024-06-10 NOTE — Telephone Encounter (Signed)
 Attempted to call patient in regards to Cardiac Rehab - LM on VM

## 2024-06-22 NOTE — Progress Notes (Signed)
 Cardiology Office Note:  .   Date:  06/23/2024  ID:  [name]., DOB [birth year], MRN [id number] PCP: Duanne Butler DASEN, MD  [facility] Providers Cardiologist:  Peter [place], MD   History of Present Illness: .   [name]. is a 73 y.o. male  with PMHx of CAD s/p NSTEMI requiring CABG x3 on 05/29/2024 (LIMA-LAD, SVG-Diagonal, and  SVG-OM), HLD, Mild Bilateral Carotid Stenosis (05/27/2024 US : 1-39 % stenosis on R/L), ADD who reports to [facility] office for follow up.   Recent hospitalization cardiology consult 7/14-22/2025 for chest pain and diagnosed with NSTEMI. ECHO 05/26/2024 showed 60-65%, mild LVH, G1DD, and no RWMA. Cath 05/27/2024 showed stenosis of 70% in LM, ostial to proximal Cx 90%, 70% in mid LAD, 85 % in Ramus, 30% in 1st/2nd Diag, 40% in 2nd Mrg and mildly elevated LVEDP of 17 mmHg. CT surgery was consulted and subsequently underwent CABG x3 on 05/29/2024 with LIMA-LAD, SVG-Diagonal, and  SVG-OM. Discharged on ASA 81 mg daily, Lipitor  80 mg daily, Plavix  75 mg, Zetia  10 mg daily, and Lopressor  25 mg BID.   Today, patient is accompanied by wife and history is obtained from both. Denies chest pain, shortness of breath, palpitations, syncope, presyncope, dizziness, orthopnea, PND,edema or acute bleeding. Reports compliance with medications except for 2 days when wife was not home. Reports eating whatever he wants and is not interested in changing diet. He is able to cut grass on riding lawnmower. He has been walking to shop which is several yards without exertional symptoms. He is able to walk around grocery stores without any concern. Denies tobacco use/alcohol/drug use.   ROS: 10 point review of system has been reviewed and considered negative except ones been listed in the HPI.   Studies Reviewed: SABRA   EKG Interpretation Date/Time:  Tuesday June 23 2024 08:41:16 EDT Ventricular Rate:  73 PR Interval:  194 QRS Duration:  80 QT Interval:  364 QTC  Calculation: 401 R Axis:   35  Text Interpretation: Normal sinus rhythm Nonspecific ST and T wave abnormality Inferior Q waves noted When compared with ECG of 30-May-2024 07:02, Premature atrial complexes are no longer Present ST no longer elevated in Anterior leads QT has shortened Confirmed by Sheron Hallmark ([id number]) on 06/23/2024 9:10:14 AM   ECHO IMPRESSIONS 05/26/2024  1. Left ventricular ejection fraction, by estimation, is 60 to 65%. The  left ventricle has normal function. The left ventricle has no regional  wall motion abnormalities. There is mild left ventricular hypertrophy of  the basal-septal segment. Left  ventricular diastolic parameters are consistent with Grade I diastolic  dysfunction (impaired relaxation).   2. Right ventricular systolic function is normal. The right ventricular  size is normal. Tricuspid regurgitation signal is inadequate for assessing  PA pressure.   3. The mitral valve is normal in structure. No evidence of mitral valve  regurgitation. No evidence of mitral stenosis.   4. The aortic valve is tricuspid. Aortic valve regurgitation is not  visualized. No aortic stenosis is present.   Pre CABG US  of Carotid/Arm/Legs Summary 05/27/2024 Right Carotid: Velocities in the right ICA are consistent with a 1-39%  stenosis.   Left Carotid: Velocities in the left ICA are consistent with a 1-39%  stenosis.  Vertebrals: Bilateral vertebral arteries demonstrate antegrade flow.  Subclavians: Normal flow hemodynamics were seen in bilateral subclavian               arteries.   Right ABI: The right  toe-brachial index is normal. Falsely elevated ABI.  Left ABI: Falsely elevated ABI.  Right Upper Extremity: Doppler waveform obliterate with right radial  compression. Doppler waveforms remain within normal limits with right  ulnar compression.  Left Upper Extremity: Doppler waveform obliterate with left radial  compression. Doppler waveforms remain within normal limits  with left ulnar  compression.   Cath 05/27/2024   Mid LM to Dist LM lesion is 70% stenosed.   Ost Cx to Prox Cx lesion is 90% stenosed.   Mid LAD lesion is 70% stenosed.   1st Diag lesion is 30% stenosed.   2nd Diag lesion is 30% stenosed.   Ramus lesion is 85% stenosed.   2nd Mrg lesion is 40% stenosed.   1.  Moderately calcified coronary arteries with significant two-vessel and distal left main disease. 2.  Left ventricular angiography was not performed.  EF was normal by echo. 3.  Mildly elevated left ventricular end-diastolic pressure at 17 mmHg.   Recommendations: Distal left main stenosis extending into the ostium of left circumflex in addition to significant LAD and ramus disease. Recommend evaluation for CABG. Resume heparin  drip 2 hours after TR band removal.  Physical Exam:   VS:  BP 104/78 (BP Location: Right Arm, Patient Position: Sitting, Cuff Size: Normal)   Pulse 73   Ht 5' 9 (1.753 m)   Wt 195 lb (88.5 kg)   BMI 28.80 kg/m    Wt Readings from Last 3 Encounters:  06/23/24 195 lb (88.5 kg)  06/01/24 200 lb 8 oz (90.9 kg)  07/17/22 201 lb 12.8 oz (91.5 kg)    GEN: Well nourished, well developed in no acute distress while sitting in chair.  NECK: No JVD; No carotid bruits CARDIAC: RRR, no murmurs, rubs, gallops RESPIRATORY:  Clear to auscultation without rales, wheezing or rhonchi  ABDOMEN: Soft, non-tender, non-distended EXTREMITIES:  No edema; No deformity   ASSESSMENT AND PLAN: .   Coronary artery disease involving coronary bypass graft of native heart without angina pectoris Hx of CABG Hyperlipidemia LDL goal <55 ECHO 05/26/2024 showed 60-65%, mild LVH, G1DD, and no RWMA. Cath 05/27/2024 showed stenosis of 70% in LM, ostial to proximal Cx 90%, 70% in mid LAD, 85 % in Ramus, 30% in 1st/2nd Diag, 40% in 2nd Mrg and mildly elevated LVEDP of 17 mmHg.  CT surgery was consulted and subsequently underwent CABG x3 on 05/29/2024 with LIMA-LAD, SVG-Diagonal, and   SVG-OM.  05/2024 LDL 100, Triglyc 169, AST/ALT WNL. Zetia  added in 05/2024,  EKG today shows NSR with nonspecific ST and T wave changes and Q waves in inferior leads. Denies any CP or exertional symptoms. No need for further ischemic evaluation at this time.  Continue on ASA 81 mg daily, Lipitor  80 mg daily, Plavix  75 mg, Zetia  10 mg daily, and Lopressor  25 mg BID.  Recommended heart healthy diet and exercise. Patient not interested in lifestyle changes. Discuss the importance of lifestyle changes and daily compliance with medications. Patient is aware of associated risk without lifestyle modification.  Ordered FLP in 3 months. If remains elevated, can consider discussing referral to Lipid clinic.  Follow up with CT surgery 06/24/2024 as scheduled for cardiac rehab readiness evaluation. Will defer cardiac rehab to CT surgery.  Carotid stenosis, bilateral 05/27/2024 US : 1-39 % stenosis on R/L No bruits noted on exam.  Continue to monitor}  Cardiac Rehabilitation Eligibility Assessment  The patient is ready to start cardiac rehabilitation pending clearance from the cardiac surgeon.     Dispo:  F/U in 3-4 month post FLP complete.   Signed, Lorette CINDERELLA Kapur, PA-C

## 2024-06-23 ENCOUNTER — Ambulatory Visit (HOSPITAL_COMMUNITY)
Admission: RE | Admit: 2024-06-23 | Discharge: 2024-06-23 | Disposition: A | Discharge status: Home / Self Care | Source: Ambulatory Visit | Attending: Thoracic Surgery (Cardiothoracic Vascular Surgery) | Admitting: Thoracic Surgery (Cardiothoracic Vascular Surgery)

## 2024-06-23 ENCOUNTER — Other Ambulatory Visit: Payer: Self-pay | Admitting: Thoracic Surgery (Cardiothoracic Vascular Surgery)

## 2024-06-23 ENCOUNTER — Ambulatory Visit: Admitting: Thoracic Surgery (Cardiothoracic Vascular Surgery)

## 2024-06-23 ENCOUNTER — Ambulatory Visit: Attending: Physician Assistant | Admitting: Physician Assistant

## 2024-06-23 ENCOUNTER — Other Ambulatory Visit (HOSPITAL_COMMUNITY): Payer: Self-pay

## 2024-06-23 ENCOUNTER — Encounter: Payer: Self-pay | Admitting: Physician Assistant

## 2024-06-23 VITALS — BP 104/78 | HR 73 | Ht 69.0 in | Wt 195.0 lb

## 2024-06-23 DIAGNOSIS — Z951 Presence of aortocoronary bypass graft: Secondary | ICD-10-CM | POA: Diagnosis not present

## 2024-06-23 DIAGNOSIS — J9811 Atelectasis: Secondary | ICD-10-CM | POA: Diagnosis not present

## 2024-06-23 DIAGNOSIS — Z48812 Encounter for surgical aftercare following surgery on the circulatory system: Secondary | ICD-10-CM | POA: Diagnosis not present

## 2024-06-23 DIAGNOSIS — E785 Hyperlipidemia, unspecified: Secondary | ICD-10-CM

## 2024-06-23 DIAGNOSIS — I25810 Atherosclerosis of coronary artery bypass graft(s) without angina pectoris: Secondary | ICD-10-CM | POA: Diagnosis not present

## 2024-06-23 DIAGNOSIS — I6523 Occlusion and stenosis of bilateral carotid arteries: Secondary | ICD-10-CM

## 2024-06-23 MED ORDER — METOPROLOL TARTRATE 25 MG PO TABS: 25.0000 mg | ORAL_TABLET | Freq: Two times a day (BID) | ORAL | 6 refills | Status: AC

## 2024-06-23 MED ORDER — ASPIRIN 81 MG PO TBEC
81.0000 mg | DELAYED_RELEASE_TABLET | Freq: Every day | ORAL | 6 refills | Status: DC
Start: 2024-06-23 — End: 2024-08-11

## 2024-06-23 MED ORDER — EZETIMIBE 10 MG PO TABS: 10.0000 mg | ORAL_TABLET | Freq: Every day | ORAL | 6 refills | Status: AC

## 2024-06-23 MED ORDER — ATORVASTATIN CALCIUM 80 MG PO TABS: 80.0000 mg | ORAL_TABLET | Freq: Every day | ORAL | 6 refills | Status: AC

## 2024-06-23 NOTE — Patient Instructions (Addendum)
 Medication Instructions:  NO CHANGES  Lab Work: FASTING LIPID PANEL TO BE DONE IN 3 MONTHS.  Testing/Procedures: NONE  Follow-Up: At [facility], you and your health needs are our priority.  As part of our continuing mission to provide you with exceptional heart care, our providers are all part of one team.  This team includes your primary Cardiologist (physician) and Advanced Practice Providers or APPs (Physician Assistants and Nurse Practitioners) who all work together to provide you with the care you need, when you need it.  Your next appointment:   3-4 MONTHS  Provider:   DR. LONI, MD OR ANY APP  We recommend signing up for the patient portal called MyChart.  Sign up information is provided on this After Visit Summary.  MyChart is used to connect with patients for Virtual Visits (Telemedicine).  Patients are able to view lab/test results, encounter notes, upcoming appointments, etc.  Non-urgent messages can be sent to your provider as well.   To learn more about what you can do with MyChart, go to [email or web address].   Other Instructions HEART HEALTHY DIET AND LOW CHOLESTEROL DIET LITERATURE GIVEN TODAY.

## 2024-06-23 NOTE — Progress Notes (Signed)
 [address] [place] [address]             [phone]       HPI: Mr. [name] is a 73 year old male with medical history of NSTEMI, cholelithiasis, nephrolithiasis and hyperlipidemia returns for routine postoperative follow-up having undergone  Coronary artery bypass grafting x 3, Left internal mammary artery to LAD, Saphenous vein graft to second diagonal, Saphenous vein graft to obtuse marginal 1  and Endoscopic vein harvest right thigh on 05/29/2024 with Dr. Kerrin.  The patient's early postoperative recovery while in the hospital was notable for routine post operative diuresis.  He was started on Plavix  and baby aspirin  for NSTEMI.  He was discharged in stable condition on 06/02/2024.      Since hospital discharge the patient reports that he has been doing well.  He presents to clinic today with his wife.  He has not needed any medications for pain and states his incisions have been healing well.  He has been mowing his lawn and driving.  He does report some fatigue but experiences this after doing heavy amounts of activity in the day.  His wife has noticed that he has had some issues with memory.  He has been more forgetful than usual.  She does not know if this is from anesthesia/hospital stay or has another cause such as needing new hearing aids.   Current Outpatient Medications  Medication Sig Dispense Refill   aspirin  EC 81 MG tablet Take 1 tablet (81 mg total) by mouth daily. Swallow whole. 30 tablet 6   atorvastatin  (LIPITOR ) 80 MG tablet Take 1 tablet (80 mg total) by mouth daily. 30 tablet 6   clopidogrel  (PLAVIX ) 75 MG tablet Take 1 tablet (75 mg total) by mouth daily. 30 tablet 1   ezetimibe  (ZETIA ) 10 MG tablet Take 1 tablet (10 mg total) by mouth daily. 30 tablet 6   metoprolol  tartrate (LOPRESSOR ) 25 MG tablet Take 1 tablet (25 mg total) by mouth 2 (two) times daily. 60 tablet 6   No current facility-administered medications for  this visit.   Vitals:   06/24/24 1123  BP: 132/83  Pulse: 78  Resp: 18  SpO2: 94%    Review of Systems  Constitutional: Negative.   Respiratory: Negative.  Negative for cough and shortness of breath.   Cardiovascular: Negative.  Negative for chest pain and leg swelling.  Neurological:  Negative for headaches.  Psychiatric/Behavioral:  Negative for depression.        Forgetfulness   All other systems reviewed and are negative.    Physical Exam Constitutional:      Appearance: Normal appearance.  HENT:     Head: Normocephalic and atraumatic.  Cardiovascular:     Rate and Rhythm: Normal rate and regular rhythm.     Heart sounds: Normal heart sounds, S1 normal and S2 normal.  Pulmonary:     Effort: Pulmonary effort is normal.     Breath sounds: Normal breath sounds.  Skin:    General: Skin is warm and dry.      Neurological:     General: No focal deficit present.     Mental Status: He is alert and oriented to person, place, and time.      Diagnostic Tests: CLINICAL DATA:  Status post CABG.   EXAM: CHEST - 2 VIEW   COMPARISON:  06/08/2024.   FINDINGS: The heart size and mediastinal contours are  within normal limits. Status post CABG and median sternotomy. Minimal linear scarring/atelectasis in the lingula and left lower lobe. No focal consolidation, pleural effusion, or pneumothorax. No acute osseous abnormality.   IMPRESSION: Minimal linear scarring/atelectasis in the lingula and left lower lobe. Otherwise, no acute cardiopulmonary findings.     Electronically Signed   By: Harrietta Sherry M.D.   On: 06/23/2024 14:48   Plan: S/P CABG x 3 -We reviewed today's chest x ray. We discussed driving and since he has already been driving he can continue. He declines participation in cardiac rehab at this time.  He feels that he has been able to increase his exercise at home. He is to continue sternal precautions until full 6 weeks from surgery.  He can increase  his activity as tolerated.  Encouraged him to take more breaks between tasks since he does report feeling fatigued.  -He has been seen by cardiology and he is to continue on aspirin  81 mg daily, Lipitor  80 mg daily, Plavix  75 mg, Zetia  10 mg daily, and Lopressor  25 mg BID.   -Patient has made an appointment with his primary care provider for follow-up of memory changes.  Unclear if this is a new finding or has been possibly exacerbated by hospitalization.  Discussed with patient and his wife and he is agreeable to see his primary care for follow-up.  James CHRISTELLA Rough, PA-C [facility] [phone]

## 2024-06-24 ENCOUNTER — Encounter: Payer: Self-pay | Admitting: Physician Assistant

## 2024-06-24 ENCOUNTER — Ambulatory Visit: Payer: Self-pay

## 2024-06-24 ENCOUNTER — Other Ambulatory Visit (HOSPITAL_COMMUNITY)

## 2024-06-24 VITALS — BP 132/83 | HR 78 | Resp 18 | Ht 69.0 in | Wt 196.0 lb

## 2024-06-24 DIAGNOSIS — Z951 Presence of aortocoronary bypass graft: Secondary | ICD-10-CM

## 2024-06-24 NOTE — Patient Instructions (Signed)
-  follow up with cardiology as scheduled -follow up with PCP as scheduled -Follow up with [facility] as needed

## 2024-06-26 ENCOUNTER — Encounter: Payer: Self-pay | Admitting: Family Medicine

## 2024-06-26 ENCOUNTER — Other Ambulatory Visit (HOSPITAL_COMMUNITY): Payer: Self-pay

## 2024-06-26 ENCOUNTER — Ambulatory Visit (INDEPENDENT_AMBULATORY_CARE_PROVIDER_SITE_OTHER): Admitting: Family Medicine

## 2024-06-26 VITALS — BP 124/70 | HR 75 | Temp 97.6°F | Ht 69.0 in | Wt 196.8 lb

## 2024-06-26 DIAGNOSIS — R413 Other amnesia: Secondary | ICD-10-CM | POA: Diagnosis not present

## 2024-06-26 DIAGNOSIS — Z951 Presence of aortocoronary bypass graft: Secondary | ICD-10-CM | POA: Diagnosis not present

## 2024-06-26 DIAGNOSIS — I2510 Atherosclerotic heart disease of native coronary artery without angina pectoris: Secondary | ICD-10-CM

## 2024-06-26 NOTE — Progress Notes (Signed)
 Subjective:    Patient ID: [name]., male    DOB: 1951-06-17, 73 y.o.   MRN: [id number]  HPI Since I last saw the patient, he was admitted to the hospital with a non-ST elevation myocardial infarction and underwent coronary artery bypass grafting.  He is here today for follow-up.  He is currently on dual antiplatelet therapy, Lipitor , Zetia , and metoprolol .  However the biggest concern today is memory loss.  The patient is adamant that nothing is going on.  However his wife states that he has been having episodes of confusion even before his recent open heart surgery.  I spoke to his daughter on the telephone who verified this.  She states that he got lost driving to a friend's house which is very unusual for him given that he goes to the same area multiple times a week.  He also has been making more careless errors.  Patient is a Music therapist and recently missed cut and missed measured several roofing panels while building a roof with his son.  This was out of character for him.  He also made a mistake repairing a piece of equipment.  He stated that it needed a new transmission when the equipment loans on hydraulics.  His son states that he knows this and if this is out of character for him to make such a mistake.  However the patient downplays this today.  Therefore I performed a Mini-Mental status exam.  Patient is able to tell me the accurate month day year and date.  He is able to remember 3 objects however when I ask him again a few minutes later he can only recall one of the objects.  Patient is able to perform spelling world in reverse correctly.  However he scored 4 out of 5 with serial sevens.  Mini-Mental status exam was 27 out of 30.  I then asked the patient to perform clock drawing.  He started by drawing the clock as a circle with the 1 at the top.  Therefore he had an empty space between 2 and 3.  The clock missed the #12 but he also was incorrect and drawing the time on the clock  however he did have recommendations shortened long hands.  Patient was not able to complete the pattern correctly on the alternating numbers and letters.  Neurologic exam today is normal.  Cranial nerves II through XII are grossly intact with muscle strength 5/5 equal and symmetric in the upper and lower extremities Past Medical History:  Diagnosis Date  . ADD (attention deficit disorder)   . HLD (hyperlipidemia)   . Hypogonadism male   . Nephrolithiasis   . Wears glasses   . Wears partial dentures    upper partial   Past Surgical History:  Procedure Laterality Date  . CHOLECYSTECTOMY N/A 03/04/2013   Procedure: LAPAROSCOPIC CHOLECYSTECTOMY WITH INTRAOPERATIVE CHOLANGIOGRAM;  Surgeon: Lynwood MALVA Pina, MD;  Location: [facility] OR;  Service: General;  Laterality: N/A;  . COLONOSCOPY    . CORONARY ARTERY BYPASS GRAFT N/A 05/29/2024   Procedure: CORONARY ARTERY BYPASS GRAFTING (CABG) TIMES THREE USING LEFT INTERNAL MAMMARY ARTERY AND ENDOSCOPICALLY HARVESTED RIGHT GREATER SAPHENOUS VEIN;  Surgeon: Kerrin Elspeth BROCKS, MD;  Location: [facility] OR;  Service: Open Heart Surgery;  Laterality: N/A;  . DISTAL BICEPS TENDON REPAIR Left   . ERCP N/A 03/03/2013   Procedure: ENDOSCOPIC RETROGRADE CHOLANGIOPANCREATOGRAPHY (ERCP);  Surgeon: James Cree, MD;  Location: [facility] ENDOSCOPY;  Service: Endoscopy;  Laterality: N/A;  prone  positioning  . FINGER SURGERY     left index and middle tendons reattached  . HERNIA REPAIR  2004   with mesh-rt ing h  . INGUINAL HERNIA REPAIR Right 08/24/2013   Procedure: HERNIA REPAIR INGUINAL ADULT;  Surgeon: Lynwood MALVA Pina, MD;  Location: [facility];  Service: General;  Laterality: Right;  right inguinal  . INSERTION OF MESH Right 08/24/2013   Procedure: INSERTION OF MESH;  Surgeon: Lynwood MALVA Pina, MD;  Location: [facility];  Service: General;  Laterality: Right;  right inguinal  . JOINT REPLACEMENT  2012   rt and lt tkr  . KNEE ARTHROSCOPY     2 times  .  LEFT HEART CATH AND CORONARY ANGIOGRAPHY N/A 05/27/2024   Procedure: LEFT HEART CATH AND CORONARY ANGIOGRAPHY;  Surgeon: Darron Deatrice LABOR, MD;  Location: [facility] INVASIVE CV LAB;  Service: Cardiovascular;  Laterality: N/A;  . ORIF HUMERUS FRACTURE Right 05/16/2013   Procedure: OPEN REDUCTION OF RIGHT HUMERAL HEAD, SUBSCAPULARUS FIXATION AND BIOTENDONESIS REPAIR;  Surgeon: Cordella Glendia Hutchinson, MD;  Location: [facility] OR;  Service: Orthopedics;  Laterality: Right;  . ROTATOR CUFF REPAIR Right   . TEE WITHOUT CARDIOVERSION N/A 05/29/2024   Procedure: ECHOCARDIOGRAM, TRANSESOPHAGEAL;  Surgeon: Kerrin Elspeth BROCKS, MD;  Location: [facility] OR;  Service: Open Heart Surgery;  Laterality: N/A;  . UPPER GI ENDOSCOPY    . VASECTOMY     Current Outpatient Medications on File Prior to Visit  Medication Sig Dispense Refill  . aspirin  EC 81 MG tablet Take 1 tablet (81 mg total) by mouth daily. Swallow whole. 30 tablet 6  . atorvastatin  (LIPITOR ) 80 MG tablet Take 1 tablet (80 mg total) by mouth daily. 30 tablet 6  . clopidogrel  (PLAVIX ) 75 MG tablet Take 1 tablet (75 mg total) by mouth daily. 30 tablet 1  . ezetimibe  (ZETIA ) 10 MG tablet Take 1 tablet (10 mg total) by mouth daily. 30 tablet 6  . metoprolol  tartrate (LOPRESSOR ) 25 MG tablet Take 1 tablet (25 mg total) by mouth 2 (two) times daily. 60 tablet 6   No current facility-administered medications on file prior to visit.   Allergies  Allergen Reactions  . Codeine Itching  . Morphine  And Codeine Other (See Comments)    Doesn't work  . Valium [Diazepam] Other (See Comments)    Pt states it jacks him up   Social History   Socioeconomic History  . Marital status: Married    Spouse name: Not on file  . Number of children: Not on file  . Years of education: Not on file  . Highest education level: 12th grade  Occupational History  . Not on file  Tobacco Use  . Smoking status: Never  . Smokeless tobacco: Never  Substance and Sexual Activity  . Alcohol  use: No  . Drug use: No  . Sexual activity: Not on file  Other Topics Concern  . Not on file  Social History Narrative  . Not on file   Social Drivers of Health   Financial Resource Strain: Low Risk  (06/25/2024)   Overall Financial Resource Strain (CARDIA)   . Difficulty of Paying Living Expenses: Not hard at all  Food Insecurity: No Food Insecurity (06/25/2024)   Hunger Vital Sign   . Worried About Programme researcher, broadcasting/film/video in the Last Year: Never true   . Ran Out of Food in the Last Year: Never true  Transportation Needs: No Transportation Needs (06/25/2024)   PRAPARE - Transportation   .  Lack of Transportation (Medical): No   . Lack of Transportation (Non-Medical): No  Physical Activity: Insufficiently Active (06/25/2024)   Exercise Vital Sign   . Days of Exercise per Week: 5 days   . Minutes of Exercise per Session: 20 min  Stress: No Stress Concern Present (06/25/2024)   [employer] of Occupational Health - Occupational Stress Questionnaire   . Feeling of Stress: Not at all  Social Connections: Socially Integrated (06/25/2024)   Social Connection and Isolation Panel   . Frequency of Communication with Friends and Family: More than three times a week   . Frequency of Social Gatherings with Friends and Family: Three times a week   . Attends Religious Services: More than 4 times per year   . Active Member of Clubs or Organizations: Yes   . Attends Banker Meetings: More than 4 times per year   . Marital Status: Married  Catering manager Violence: Not At Risk (05/26/2024)   Humiliation, Afraid, Rape, and Kick questionnaire   . Fear of Current or Ex-Partner: No   . Emotionally Abused: No   . Physically Abused: No   . Sexually Abused: No   Family History  Problem Relation Age of Onset  . Cancer Mother        breast  . Diabetes Father     Review of Systems  All other systems reviewed and are negative.      Objective:   Physical Exam Vitals reviewed.   Constitutional:      General: He is not in acute distress.    Appearance: Normal appearance. He is normal weight. He is not ill-appearing, toxic-appearing or diaphoretic.  HENT:     Head: Normocephalic and atraumatic.     Right Ear: Tympanic membrane, ear canal and external ear normal. There is no impacted cerumen.     Left Ear: Tympanic membrane, ear canal and external ear normal. There is no impacted cerumen.     Nose: Nose normal. No congestion or rhinorrhea.     Mouth/Throat:     Mouth: Mucous membranes are moist.     Pharynx: Oropharynx is clear. No oropharyngeal exudate or posterior oropharyngeal erythema.  Eyes:     General: No scleral icterus.    Extraocular Movements: Extraocular movements intact.     Conjunctiva/sclera: Conjunctivae normal.     Pupils: Pupils are equal, round, and reactive to light.  Neck:     Vascular: No carotid bruit.  Cardiovascular:     Rate and Rhythm: Normal rate and regular rhythm.     Pulses: Normal pulses.     Heart sounds: Normal heart sounds. No murmur heard.    No friction rub. No gallop.  Pulmonary:     Effort: Pulmonary effort is normal. No respiratory distress.     Breath sounds: Normal breath sounds. No stridor. No wheezing, rhonchi or rales.  Chest:     Chest wall: No tenderness.  Abdominal:     General: Abdomen is flat. Bowel sounds are normal. There is no distension.     Tenderness: There is no abdominal tenderness. There is no guarding or rebound.     Hernia: No hernia is present.  Musculoskeletal:        General: No deformity.     Left shoulder: Decreased strength.     Cervical back: Normal range of motion and neck supple. No rigidity.     Right lower leg: No edema.     Left lower leg: No edema.  Lymphadenopathy:  Cervical: No cervical adenopathy.  Skin:    Coloration: Skin is not jaundiced.     Findings: No bruising, erythema or lesion.  Neurological:     General: No focal deficit present.     Mental Status: He is  alert and oriented to person, place, and time. Mental status is at baseline.  Psychiatric:        Mood and Affect: Mood normal.        Behavior: Behavior normal.        Thought Content: Thought content normal.        Judgment: Judgment normal.           Assessment & Plan:   Memory loss of unknown cause - Plan: AD-DETECT ABETA 42/40 AND [id number] EVALUATION, PLASMA, Vitamin B12, TSH, CBC with Differential/Platelet, Comprehensive metabolic panel with GFR, MR Brain W Wo Contrast  S/P CABG x 3  Coronary artery disease involving native coronary artery of native heart without angina pectoris Patient downplays the memory loss however his Mini-Mental status exam and clock drawing exam indicate that there is some mild underlying cognitive impairment that I believe is not simply related to age.  Wife states this has been occurring even prior to his open of surgery.  Therefore I recommended an MRI of the brain to determine if the patient may have had strokes due to his underlying cardiovascular disease.  I would also recommend checking a vitamin B12 as well as a TSH along with a CBC and a CMP.  Also will perform p tau assay to screen for evidence of Alzheimer's disease.  Await the results of these test before determining the next step in therapy

## 2024-06-27 LAB — COMPREHENSIVE METABOLIC PANEL WITH GFR
AG Ratio: 1.8 (calc) (ref 1.0–2.5)
ALT: 25 U/L (ref 9–46)
AST: 15 U/L (ref 10–35)
Albumin: 4.2 g/dL (ref 3.6–5.1)
Alkaline phosphatase (APISO): 102 U/L (ref 35–144)
BUN: 15 mg/dL (ref 7–25)
CO2: 26 mmol/L (ref 20–32)
Calcium: 9.3 mg/dL (ref 8.6–10.3)
Chloride: 104 mmol/L (ref 98–110)
Creat: 0.98 mg/dL (ref 0.70–1.28)
Globulin: 2.3 g/dL (ref 1.9–3.7)
Glucose, Bld: 121 mg/dL — ABNORMAL HIGH (ref 65–99)
Potassium: 4.4 mmol/L (ref 3.5–5.3)
Sodium: 139 mmol/L (ref 135–146)
Total Bilirubin: 0.8 mg/dL (ref 0.2–1.2)
Total Protein: 6.5 g/dL (ref 6.1–8.1)
eGFR: 82 mL/min/1.73m2 (ref >=60)

## 2024-06-27 LAB — CBC WITH DIFFERENTIAL/PLATELET
Absolute Lymphocytes: 1660 {cells}/uL (ref 850–3900)
Absolute Monocytes: 764 {cells}/uL (ref 200–950)
Basophils Absolute: 100 {cells}/uL (ref 0–200)
Basophils Relative: 1.2 %
Eosinophils Absolute: 697 {cells}/uL — ABNORMAL HIGH (ref 15–500)
Eosinophils Relative: 8.4 %
HCT: 43.3 % (ref 38.5–50.0)
Hemoglobin: 14.3 g/dL (ref 13.2–17.1)
MCH: 29.6 pg (ref 27.0–33.0)
MCHC: 33 g/dL (ref 32.0–36.0)
MCV: 89.6 fL (ref 80.0–100.0)
MPV: 9.9 fL (ref 7.5–12.5)
Monocytes Relative: 9.2 %
Neutro Abs: 5080 {cells}/uL (ref 1500–7800)
Neutrophils Relative %: 61.2 %
Platelets: 241 Thousand/uL (ref 140–400)
RBC: 4.83 Million/uL (ref 4.20–5.80)
RDW: 13.1 % (ref 11.0–15.0)
Total Lymphocyte: 20 %
WBC: 8.3 Thousand/uL (ref 3.8–10.8)

## 2024-06-27 LAB — TSH: TSH: 1.69 m[IU]/L (ref 0.40–4.50)

## 2024-06-27 LAB — VITAMIN B12: Vitamin B-12: 244 pg/mL (ref 200–1100)

## 2024-06-29 ENCOUNTER — Ambulatory Visit: Payer: Self-pay | Admitting: Family Medicine

## 2024-07-02 LAB — ECHO INTRAOPERATIVE TEE
AR max vel: 2.58 cm2
AV Area VTI: 2.42 cm2
AV Area mean vel: 2.39 cm2
AV Mean grad: 2 mmHg
AV Peak grad: 4 mmHg
Ao pk vel: 1 m/s
Height: 69 in
MV VTI: 2.57 cm2
Weight: 3128.77 [oz_av]

## 2024-07-03 ENCOUNTER — Telehealth: Payer: Self-pay

## 2024-07-03 NOTE — Telephone Encounter (Signed)
 Spoke with patient. Pt states that he does not have anyone at home to give him injections. Is there an alternative to him coming to the office daily for a week for the injections? Thanks.   Copied from CRM [id number]. Topic: Clinical - Lab/Test Results >> Jul 03, 2024  3:03 PM Donna BRAVO wrote: Reason for CRM: patient called asking for lab results Read verbatim:  James ONEIDA Burr, MD 06/29/2024  7:19 AM EDT   B12 levels are borderline low.  This can cause confusion.  Recommend vitamin B12 1000 mcg injected daily for 1 week then weekly for 1 month then monthly thereafter to see if this will help with memory/confson.  Other lab work is still pending  Patient would like to [place] who left a message

## 2024-07-05 LAB — AD-DETECT ABETA 42/40 AND P-TAU217 EVALUATION, PLASMA
ABETA 40: 224 pg/mL
ABETA 42/40 RATIO: 0.143 — ABNORMAL LOW (ref >=0.170)
ABETA 42: 32 pg/mL
AD DETECT PTAU217: 0.1 pg/mL (ref <=0.15)
LIKELIHOOD SCORE: 0.2221

## 2024-07-07 ENCOUNTER — Other Ambulatory Visit

## 2024-07-08 ENCOUNTER — Inpatient Hospital Stay
Admission: RE | Admit: 2024-07-08 | Discharge: 2024-07-08 | Discharge status: Home / Self Care | Source: Ambulatory Visit | Attending: Family Medicine | Admitting: Family Medicine

## 2024-07-08 ENCOUNTER — Ambulatory Visit (INDEPENDENT_AMBULATORY_CARE_PROVIDER_SITE_OTHER)

## 2024-07-08 DIAGNOSIS — E538 Deficiency of other specified B group vitamins: Secondary | ICD-10-CM

## 2024-07-08 DIAGNOSIS — G319 Degenerative disease of nervous system, unspecified: Secondary | ICD-10-CM | POA: Diagnosis not present

## 2024-07-08 DIAGNOSIS — R413 Other amnesia: Secondary | ICD-10-CM

## 2024-07-08 MED ORDER — GADOPICLENOL 0.5 MMOL/ML IV SOLN
9.0000 mL | Freq: Once | INTRAVENOUS | Status: AC | PRN
  Administered 2024-07-08: 9 mL via INTRAVENOUS

## 2024-07-08 MED ORDER — CYANOCOBALAMIN 1000 MCG/ML IJ SOLN
1000.0000 ug | Freq: Once | INTRAMUSCULAR | Status: AC
  Administered 2024-07-08: 1000 ug via INTRAMUSCULAR

## 2024-07-08 NOTE — Addendum Note (Signed)
 Addended by: ANGELENA RONAL SLATER MARLA on: 07/08/2024 08:24 AM   Modules accepted: Orders

## 2024-07-08 NOTE — Progress Notes (Signed)
 Patient is in office today for a nurse visit for B12 Injection. Patient Injection was given in the  Left deltoid. Patient tolerated injection well.

## 2024-07-10 ENCOUNTER — Encounter: Payer: Self-pay | Admitting: Family Medicine

## 2024-07-10 ENCOUNTER — Telehealth: Payer: Self-pay

## 2024-07-10 DIAGNOSIS — G3184 Mild cognitive impairment, so stated: Secondary | ICD-10-CM | POA: Insufficient documentation

## 2024-07-10 NOTE — Telephone Encounter (Signed)
 CALL REPORT MRI: Nonspecific 5 mm focus of asymmetric enhancement within the left thalamus. This could reflect a late subacute infarct, vascular enhancement or a developmental venous anomaly. Although considered unlikely, a neoplastic lesion cannot be excluded, and a short-interval follow-up brain MRI (with and without contrast) is recommended in 6-8 weeks to ensure stability or resolution. RESULT IN PT'S CHART. MJP,LPN

## 2024-07-15 ENCOUNTER — Ambulatory Visit (INDEPENDENT_AMBULATORY_CARE_PROVIDER_SITE_OTHER): Admitting: Family Medicine

## 2024-07-15 DIAGNOSIS — E538 Deficiency of other specified B group vitamins: Secondary | ICD-10-CM | POA: Diagnosis not present

## 2024-07-15 MED ORDER — CYANOCOBALAMIN 1000 MCG/ML IJ SOLN
1000.0000 ug | Freq: Once | INTRAMUSCULAR | Status: AC
  Administered 2024-07-15: 1000 ug via INTRAMUSCULAR

## 2024-07-15 NOTE — Progress Notes (Signed)
 Patient is in office today for a nurse visit for B12 Injection. Patient Injection was given in the  Left deltoid. Patient tolerated injection well.

## 2024-07-16 ENCOUNTER — Telehealth: Payer: Self-pay | Admitting: Family Medicine

## 2024-07-16 NOTE — Telephone Encounter (Signed)
 Copied from CRM [id number]. Topic: General - Other >> Jul 10, 2024  9:26 AM Herma G wrote: Reason for CRM: Ms. Annabella gave a courtesy call to check if office was able to get a hold of pt's recent brain MRI results, please call back at [phone] to inform, it's okay to leave a message.

## 2024-07-22 ENCOUNTER — Ambulatory Visit (INDEPENDENT_AMBULATORY_CARE_PROVIDER_SITE_OTHER): Admitting: Family Medicine

## 2024-07-22 DIAGNOSIS — E538 Deficiency of other specified B group vitamins: Secondary | ICD-10-CM

## 2024-07-22 MED ORDER — CYANOCOBALAMIN 1000 MCG/ML IJ SOLN
1000.0000 ug | Freq: Once | INTRAMUSCULAR | Status: AC
  Administered 2024-07-22: 1000 ug via INTRAMUSCULAR

## 2024-07-22 NOTE — Progress Notes (Unsigned)
 Patient is in office today for a nurse visit for B12 Injection. Patient Injection was given in the  Left deltoid. Patient tolerated injection well.

## 2024-07-29 ENCOUNTER — Ambulatory Visit (INDEPENDENT_AMBULATORY_CARE_PROVIDER_SITE_OTHER)

## 2024-07-29 DIAGNOSIS — E538 Deficiency of other specified B group vitamins: Secondary | ICD-10-CM

## 2024-07-29 MED ORDER — CYANOCOBALAMIN 1000 MCG/ML IJ SOLN
1000.0000 ug | Freq: Once | INTRAMUSCULAR | Status: AC
  Administered 2024-07-29: 1000 ug via INTRAMUSCULAR

## 2024-07-29 NOTE — Progress Notes (Signed)
 Patient is in office today for a nurse visit for B12 Injection. Patient Injection was given in the  Left deltoid. Patient tolerated injection well.  Vallerie Gave, CMA

## 2024-08-01 ENCOUNTER — Other Ambulatory Visit: Payer: Self-pay | Admitting: Family Medicine

## 2024-08-03 NOTE — Telephone Encounter (Signed)
 LOV 06/26/2024    Requested Prescriptions  Pending Prescriptions Disp Refills   clopidogrel  (PLAVIX ) 75 MG tablet [Pharmacy Med Name: CLOPIDOGREL  75MG  TABLET] 30 tablet 0    Sig: TAKE 1 TABLET BY MOUTH ONCE A DAY     Hematology: Antiplatelets - clopidogrel  Failed - 08/03/2024  2:13 PM      Failed - Valid encounter within last 6 months    Recent Outpatient Visits           1 week ago B12 deficiency   [place] [facility] Duanne Butler DASEN, MD   1 month ago Memory loss of unknown cause   [place] [facility] Duanne Butler DASEN, MD   1 year ago Laceration of right index finger without foreign body without damage to nail, initial encounter   [place] [facility] Duanne Butler DASEN, MD   2 years ago Prostate cancer screening    [facility] [place], Butler DASEN, MD       Future Appointments             In 1 month Loni Soyla LABOR, MD [facility]            Passed - HCT in normal range and within 180 days    HCT  Date Value Ref Range Status  06/26/2024 43.3 38.5 - 50.0 % Final         Passed - HGB in normal range and within 180 days    Hemoglobin  Date Value Ref Range Status  06/26/2024 14.3 13.2 - 17.1 g/dL Final         Passed - PLT in normal range and within 180 days    Platelets  Date Value Ref Range Status  06/26/2024 241 140 - 400 Thousand/uL Final         Passed - Cr in normal range and within 360 days    Creat  Date Value Ref Range Status  06/26/2024 0.98 0.70 - 1.28 mg/dL Final

## 2024-08-11 ENCOUNTER — Encounter: Payer: Self-pay | Admitting: Family Medicine

## 2024-08-11 ENCOUNTER — Ambulatory Visit: Admitting: Family Medicine

## 2024-08-11 VITALS — BP 126/76 | HR 66 | Temp 97.9°F | Ht 69.0 in | Wt 197.6 lb

## 2024-08-11 DIAGNOSIS — E538 Deficiency of other specified B group vitamins: Secondary | ICD-10-CM

## 2024-08-11 DIAGNOSIS — Z0001 Encounter for general adult medical examination with abnormal findings: Secondary | ICD-10-CM

## 2024-08-11 DIAGNOSIS — Z125 Encounter for screening for malignant neoplasm of prostate: Secondary | ICD-10-CM | POA: Diagnosis not present

## 2024-08-11 DIAGNOSIS — I2510 Atherosclerotic heart disease of native coronary artery without angina pectoris: Secondary | ICD-10-CM | POA: Diagnosis not present

## 2024-08-11 DIAGNOSIS — R413 Other amnesia: Secondary | ICD-10-CM | POA: Diagnosis not present

## 2024-08-11 DIAGNOSIS — E7800 Pure hypercholesterolemia, unspecified: Secondary | ICD-10-CM | POA: Diagnosis not present

## 2024-08-11 DIAGNOSIS — Z23 Encounter for immunization: Secondary | ICD-10-CM

## 2024-08-11 LAB — COMPREHENSIVE METABOLIC PANEL WITH GFR
AG Ratio: 2 (calc) (ref 1.0–2.5)
ALT: 22 U/L (ref 9–46)
AST: 16 U/L (ref 10–35)
Albumin: 3.9 g/dL (ref 3.6–5.1)
Alkaline phosphatase (APISO): 88 U/L (ref 35–144)
BUN: 13 mg/dL (ref 7–25)
CO2: 27 mmol/L (ref 20–32)
Calcium: 8.9 mg/dL (ref 8.6–10.3)
Chloride: 106 mmol/L (ref 98–110)
Creat: 0.9 mg/dL (ref 0.70–1.28)
Globulin: 2 g/dL (ref 1.9–3.7)
Glucose, Bld: 132 mg/dL — ABNORMAL HIGH (ref 65–99)
Potassium: 3.8 mmol/L (ref 3.5–5.3)
Sodium: 140 mmol/L (ref 135–146)
Total Bilirubin: 1 mg/dL (ref 0.2–1.2)
Total Protein: 5.9 g/dL — ABNORMAL LOW (ref 6.1–8.1)
eGFR: 91 mL/min/1.73m2 (ref >=60)

## 2024-08-11 LAB — CBC WITH DIFFERENTIAL/PLATELET
Absolute Lymphocytes: 1646 {cells}/uL (ref 850–3900)
Absolute Monocytes: 592 {cells}/uL (ref 200–950)
Basophils Absolute: 82 {cells}/uL (ref 0–200)
Basophils Relative: 1.2 %
Eosinophils Absolute: 272 {cells}/uL (ref 15–500)
Eosinophils Relative: 4 %
HCT: 44.6 % (ref 38.5–50.0)
Hemoglobin: 14.4 g/dL (ref 13.2–17.1)
MCH: 29.3 pg (ref 27.0–33.0)
MCHC: 32.3 g/dL (ref 32.0–36.0)
MCV: 90.8 fL (ref 80.0–100.0)
MPV: 10.2 fL (ref 7.5–12.5)
Monocytes Relative: 8.7 %
Neutro Abs: 4209 {cells}/uL (ref 1500–7800)
Neutrophils Relative %: 61.9 %
Platelets: 214 Thousand/uL (ref 140–400)
RBC: 4.91 Million/uL (ref 4.20–5.80)
RDW: 13.1 % (ref 11.0–15.0)
Total Lymphocyte: 24.2 %
WBC: 6.8 Thousand/uL (ref 3.8–10.8)

## 2024-08-11 LAB — LIPID PANEL
Cholesterol: 86 mg/dL (ref <200)
HDL: 43 mg/dL (ref >=40)
LDL Cholesterol (Calc): 24 mg/dL
Non-HDL Cholesterol (Calc): 43 mg/dL (ref <130)
Total CHOL/HDL Ratio: 2 (calc) (ref <5.0)
Triglycerides: 103 mg/dL (ref <150)

## 2024-08-11 LAB — VITAMIN B12: Vitamin B-12: 442 pg/mL (ref 200–1100)

## 2024-08-11 LAB — PSA: PSA: 1.93 ng/mL (ref <=4.00)

## 2024-08-11 MED ORDER — CYANOCOBALAMIN 1000 MCG/ML IJ SOLN: 1000.0000 ug | Freq: Once | INTRAMUSCULAR | 1 refills | Status: AC

## 2024-08-11 NOTE — Addendum Note (Signed)
 Addended by: ANGELENA RONAL SLATER MARLA on: 08/11/2024 09:39 AM   Modules accepted: Orders

## 2024-08-11 NOTE — Progress Notes (Signed)
 Subjective:    Patient ID: James KATHEE Verdon Mickey., male    DOB: 12/28/1950, 73 y.o.   MRN: [id number]  HPI 06/26/24 Since I last saw the patient, he was admitted to the hospital with a non-ST elevation myocardial infarction and underwent coronary artery bypass grafting.  He is here today for follow-up.  He is currently on dual antiplatelet therapy, Lipitor , Zetia , and metoprolol .  However the biggest concern today is memory loss.  The patient is adamant that nothing is going on.  However his wife states that he has been having episodes of confusion even before his recent open heart surgery.  I spoke to his daughter on the telephone who verified this.  She states that he got lost driving to a friend's house which is very unusual for him given that he goes to the same area multiple times a week.  He also has been making more careless errors.  Patient is a Music therapist and recently missed cut and missed measured several roofing panels while building a roof with his son.  This was out of character for him.  He also made a mistake repairing a piece of equipment.  He stated that it needed a new transmission when the equipment loans on hydraulics.  His son states that he knows this and if this is out of character for him to make such a mistake.  However the patient downplays this today.  Therefore I performed a Mini-Mental status exam.  Patient is able to tell me the accurate month day year and date.  He is able to remember 3 objects however when I ask him again a few minutes later he can only recall one of the objects.  Patient is able to perform spelling world in reverse correctly.  However he scored 4 out of 5 with serial sevens.  Mini-Mental status exam was 27 out of 30.  I then asked the patient to perform clock drawing.  He started by drawing the clock as a circle with the 1 at the top.  Therefore he had an empty space between 2 and 3.  The clock missed the #12 but he also was incorrect and drawing the time on the  clock however he did have recommendations shortened long hands.  Patient was not able to complete the pattern correctly on the alternating numbers and letters.  Neurologic exam today is normal.  Cranial nerves II through XII are grossly intact with muscle strength 5/5 equal and symmetric in the upper and lower extremities.  At that time, my plan was: Patient downplays the memory loss however his Mini-Mental status exam and clock drawing exam indicate that there is some mild underlying cognitive impairment that I believe is not simply related to age.  Wife states this has been occurring even prior to his open of surgery.  Therefore I recommended an MRI of the brain to determine if the patient may have had strokes due to his underlying cardiovascular disease.  I would also recommend checking a vitamin B12 as well as a TSH along with a CBC and a CMP.  Also will perform p tau assay to screen for evidence of Alzheimer's disease.  Await the results of these test before determining the next step in therapy  08/11/24 MRI revealed: 1. Nonspecific 5 mm focus of asymmetric enhancement within the left thalamus. This could reflect a late subacute infarct, vascular enhancement or a developmental venous anomaly. Although considered unlikely, a neoplastic lesion cannot be excluded, and a short-interval follow-up  brain MRI (with and without contrast) is recommended in 6-8 weeks to ensure stability or resolution. 2. Chronic lacunar infarcts and/or prominent perivascular spaces along the frontal horn of the left lateral ventricle. 3. Background mild cerebral white matter chronic small vessel ischemic disease. 4. Mild generalized parenchymal atrophy. 5. Small left mastoid effusion.  P-tau 217 assay showed a low likelihood for Alzheimer's disease.  B12 was 244.  Other labs were unremarkable.  Therefore, I favor the diagnosis of vascular dementia.  Currently receiving b12 injections but memory loss has not improved.   Continues to struggle with short term memory and rapid recall. Currently on plavix  for secondary prevention of stroke and for heart disease.  Patient is here today for his physical.  He is due for a flu shot.  He is due for the shingles vaccine.  Pneumonia vaccine is up-to-date.  Cologuard was checked in 2023.  It is due next year.  He is due for PSA.  He has been receiving B12 injections now for more than a month.  He would like to start administering these at home.  He has not seen any change in his short-term memory Past Medical History:  Diagnosis Date   ADD (attention deficit disorder)    CAD (coronary artery disease)    HLD (hyperlipidemia)    Hypogonadism male    Mild cognitive impairment    suspect vascular cause based on mri (2025) and pTau-217 result   Nephrolithiasis    Wears glasses    Wears partial dentures    upper partial   Past Surgical History:  Procedure Laterality Date   CHOLECYSTECTOMY N/A 03/04/2013   Procedure: LAPAROSCOPIC CHOLECYSTECTOMY WITH INTRAOPERATIVE CHOLANGIOGRAM;  Surgeon: Lynwood MALVA Pina, MD;  Location: [facility] OR;  Service: General;  Laterality: N/A;   COLONOSCOPY     CORONARY ARTERY BYPASS GRAFT N/A 05/29/2024   Procedure: CORONARY ARTERY BYPASS GRAFTING (CABG) TIMES THREE USING LEFT INTERNAL MAMMARY ARTERY AND ENDOSCOPICALLY HARVESTED RIGHT GREATER SAPHENOUS VEIN;  Surgeon: Kerrin Elspeth BROCKS, MD;  Location: [facility] OR;  Service: Open Heart Surgery;  Laterality: N/A;   DISTAL BICEPS TENDON REPAIR Left    ERCP N/A 03/03/2013   Procedure: ENDOSCOPIC RETROGRADE CHOLANGIOPANCREATOGRAPHY (ERCP);  Surgeon: James Cree, MD;  Location: [facility] ENDOSCOPY;  Service: Endoscopy;  Laterality: N/A;  prone positioning   FINGER SURGERY     left index and middle tendons reattached   HERNIA REPAIR  2004   with mesh-rt ing h   INGUINAL HERNIA REPAIR Right 08/24/2013   Procedure: HERNIA REPAIR INGUINAL ADULT;  Surgeon: Lynwood MALVA Pina, MD;  Location: [facility];  Service:  General;  Laterality: Right;  right inguinal   INSERTION OF MESH Right 08/24/2013   Procedure: INSERTION OF MESH;  Surgeon: Lynwood MALVA Pina, MD;  Location: [facility];  Service: General;  Laterality: Right;  right inguinal   JOINT REPLACEMENT  2012   rt and lt tkr   KNEE ARTHROSCOPY     2 times   LEFT HEART CATH AND CORONARY ANGIOGRAPHY N/A 05/27/2024   Procedure: LEFT HEART CATH AND CORONARY ANGIOGRAPHY;  Surgeon: Darron Deatrice LABOR, MD;  Location: [facility] INVASIVE CV LAB;  Service: Cardiovascular;  Laterality: N/A;   ORIF HUMERUS FRACTURE Right 05/16/2013   Procedure: OPEN REDUCTION OF RIGHT HUMERAL HEAD, SUBSCAPULARUS FIXATION AND BIOTENDONESIS REPAIR;  Surgeon: Cordella Glendia Hutchinson, MD;  Location[facility] OR;  Service: Orthopedics;  Laterality: Right;   ROTATOR CUFF REPAIR Right    TEE WITHOUT CARDIOVERSION N/A  05/29/2024   Procedure: ECHOCARDIOGRAM, TRANSESOPHAGEAL;  Surgeon: Kerrin Elspeth BROCKS, MD;  Location: [facility];  Service: Open Heart Surgery;  Laterality: N/A;   UPPER GI ENDOSCOPY     VASECTOMY     Current Outpatient Medications on File Prior to Visit  Medication Sig Dispense Refill   aspirin  EC 81 MG tablet Take 1 tablet (81 mg total) by mouth daily. Swallow whole. 30 tablet 6   atorvastatin  (LIPITOR ) 80 MG tablet Take 1 tablet (80 mg total) by mouth daily. 30 tablet 6   clopidogrel  (PLAVIX ) 75 MG tablet TAKE 1 TABLET BY MOUTH ONCE A DAY 30 tablet 0   ezetimibe  (ZETIA ) 10 MG tablet Take 1 tablet (10 mg total) by mouth daily. 30 tablet 6   metoprolol  tartrate (LOPRESSOR ) 25 MG tablet Take 1 tablet (25 mg total) by mouth 2 (two) times daily. 60 tablet 6   No current facility-administered medications on file prior to visit.   Allergies  Allergen Reactions   Codeine Itching   Morphine  And Codeine Other (See Comments)    Doesn't work   Valium [Diazepam] Other (See Comments)    Pt states it jacks him up   Social History   Socioeconomic History   Marital status: Married     Spouse name: Not on file   Number of children: Not on file   Years of education: Not on file   Highest education level: 12th grade  Occupational History   Not on file  Tobacco Use   Smoking status: Never   Smokeless tobacco: Never  Substance and Sexual Activity   Alcohol use: No   Drug use: No   Sexual activity: Not on file  Other Topics Concern   Not on file  Social History Narrative   Not on file   Social Drivers of Health   Financial Resource Strain: Low Risk  (06/25/2024)   Overall Financial Resource Strain (CARDIA)    Difficulty of Paying Living Expenses: Not hard at all  Food Insecurity: No Food Insecurity (06/25/2024)   Hunger Vital Sign    Worried About Running Out of Food in the Last Year: Never true    Ran Out of Food in the Last Year: Never true  Transportation Needs: No Transportation Needs (06/25/2024)   PRAPARE - Administrator, Civil Service (Medical): No    Lack of Transportation (Non-Medical): No  Physical Activity: Insufficiently Active (06/25/2024)   Exercise Vital Sign    Days of Exercise per Week: 5 days    Minutes of Exercise per Session: 20 min  Stress: No Stress Concern Present (06/25/2024)   [employer] of Occupational Health - Occupational Stress Questionnaire    Feeling of Stress: Not at all  Social Connections: Socially Integrated (06/25/2024)   Social Connection and Isolation Panel    Frequency of Communication with Friends and Family: More than three times a week    Frequency of Social Gatherings with Friends and Family: Three times a week    Attends Religious Services: More than 4 times per year    Active Member of Clubs or Organizations: Yes    Attends Banker Meetings: More than 4 times per year    Marital Status: Married  Catering manager Violence: Not At Risk (05/26/2024)   Humiliation, Afraid, Rape, and Kick questionnaire    Fear of Current or Ex-Partner: No    Emotionally Abused: No    Physically Abused: No     Sexually Abused: No   Family  History  Problem Relation Age of Onset   Cancer Mother        breast   Diabetes Father     Review of Systems  All other systems reviewed and are negative.      Objective:   Physical Exam Vitals reviewed.  Constitutional:      General: He is not in acute distress.    Appearance: Normal appearance. He is normal weight. He is not ill-appearing, toxic-appearing or diaphoretic.  HENT:     Head: Normocephalic and atraumatic.     Right Ear: Tympanic membrane, ear canal and external ear normal. There is no impacted cerumen.     Left Ear: Tympanic membrane, ear canal and external ear normal. There is no impacted cerumen.     Nose: Nose normal. No congestion or rhinorrhea.     Mouth/Throat:     Mouth: Mucous membranes are moist.     Pharynx: Oropharynx is clear. No oropharyngeal exudate or posterior oropharyngeal erythema.  Eyes:     General: No scleral icterus.    Extraocular Movements: Extraocular movements intact.     Conjunctiva/sclera: Conjunctivae normal.     Pupils: Pupils are equal, round, and reactive to light.  Neck:     Vascular: No carotid bruit.  Cardiovascular:     Rate and Rhythm: Normal rate and regular rhythm.     Pulses: Normal pulses.     Heart sounds: Normal heart sounds. No murmur heard.    No friction rub. No gallop.  Pulmonary:     Effort: Pulmonary effort is normal. No respiratory distress.     Breath sounds: Normal breath sounds. No stridor. No wheezing, rhonchi or rales.  Chest:     Chest wall: No tenderness.  Abdominal:     General: Abdomen is flat. Bowel sounds are normal. There is no distension.     Tenderness: There is no abdominal tenderness. There is no guarding or rebound.     Hernia: No hernia is present.  Musculoskeletal:        General: No deformity.     Left shoulder: Decreased strength.     Cervical back: Normal range of motion and neck supple. No rigidity.     Right lower leg: No edema.     Left lower  leg: No edema.  Lymphadenopathy:     Cervical: No cervical adenopathy.  Skin:    Coloration: Skin is not jaundiced.     Findings: No bruising, erythema or lesion.  Neurological:     General: No focal deficit present.     Mental Status: He is alert and oriented to person, place, and time. Mental status is at baseline.  Psychiatric:        Mood and Affect: Mood normal.        Behavior: Behavior normal.        Thought Content: Thought content normal.        Judgment: Judgment normal.           Assessment & Plan:   Memory loss of unknown cause  B12 deficiency - Plan: Vitamin B12  Coronary artery disease involving native coronary artery of native heart without angina pectoris - Plan: CBC with Differential/Platelet, Comprehensive metabolic panel with GFR, Lipid panel  Pure hypercholesterolemia  Prostate cancer screening - Plan: PSA Check a B12 level.  Continue B12 1000 mcg IM monthly indefinitely.  Continue Plavix  75 mg daily except for secondary prevention of strokes and heart disease.  Discontinue aspirin .  No indication for  Aricept at the present time.  Check CBC CMP and lipid panel.  Ideally would like to see LDL cholesterol less than 55.  Patient likely has vascular dementia.  Patient received flu shot and shingles vaccine today.

## 2024-08-13 ENCOUNTER — Ambulatory Visit: Payer: Self-pay | Admitting: Family Medicine

## 2024-09-16 DIAGNOSIS — Z951 Presence of aortocoronary bypass graft: Secondary | ICD-10-CM | POA: Diagnosis not present

## 2024-09-16 DIAGNOSIS — E785 Hyperlipidemia, unspecified: Secondary | ICD-10-CM | POA: Diagnosis not present

## 2024-09-16 DIAGNOSIS — I6523 Occlusion and stenosis of bilateral carotid arteries: Secondary | ICD-10-CM | POA: Diagnosis not present

## 2024-09-16 DIAGNOSIS — I25810 Atherosclerosis of coronary artery bypass graft(s) without angina pectoris: Secondary | ICD-10-CM | POA: Diagnosis not present

## 2024-09-16 LAB — LIPID PANEL
Chol/HDL Ratio: 2 ratio (ref 0.0–5.0)
Cholesterol, Total: 93 mg/dL — ABNORMAL LOW (ref 100–199)
HDL: 47 mg/dL (ref >39)
LDL Chol Calc (NIH): 22 mg/dL (ref 0–99)
Triglycerides: 140 mg/dL (ref 0–149)
VLDL Cholesterol Cal: 24 mg/dL (ref 5–40)

## 2024-09-18 ENCOUNTER — Ambulatory Visit: Payer: Self-pay | Admitting: Physician Assistant

## 2024-09-23 ENCOUNTER — Ambulatory Visit: Attending: Student in an Organized Health Care Education/Training Program | Admitting: Internal Medicine

## 2024-09-23 ENCOUNTER — Encounter: Payer: Self-pay | Admitting: Internal Medicine

## 2024-09-23 VITALS — BP 122/82 | HR 86 | Resp 16 | Ht 69.0 in | Wt 201.0 lb

## 2024-09-23 DIAGNOSIS — G3184 Mild cognitive impairment, so stated: Secondary | ICD-10-CM

## 2024-09-23 DIAGNOSIS — I214 Non-ST elevation (NSTEMI) myocardial infarction: Secondary | ICD-10-CM | POA: Diagnosis not present

## 2024-09-23 DIAGNOSIS — Z951 Presence of aortocoronary bypass graft: Secondary | ICD-10-CM

## 2024-09-23 DIAGNOSIS — E785 Hyperlipidemia, unspecified: Secondary | ICD-10-CM

## 2024-09-23 DIAGNOSIS — I6523 Occlusion and stenosis of bilateral carotid arteries: Secondary | ICD-10-CM

## 2024-09-23 DIAGNOSIS — I25810 Atherosclerosis of coronary artery bypass graft(s) without angina pectoris: Secondary | ICD-10-CM

## 2024-09-23 NOTE — Patient Instructions (Addendum)
 Medication Instructions:  No Changes  Follow-Up: At [facility], you and your health needs are our priority.  As part of our continuing mission to provide you with exceptional heart care, our providers are all part of one team.  This team includes your primary Cardiologist (physician) and Advanced Practice Providers or APPs (Physician Assistants and Nurse Practitioners) who all work together to provide you with the care you need, when you need it.  Your next appointment:   6 month(s)  Provider:   Peter Jordan, MD   Other Instructions Please follow up with your Primary Care Doctor regarding your MRI of the brain. Call us  or send a MyChart message with any Cardiology Concerns.

## 2024-09-23 NOTE — Progress Notes (Signed)
 Cardiology Office Note:  .   Date:  09/23/2024  ID:  Elsie KATHEE Verdon Mickey., DOB [birth year], MRN [id number] PCP: Duanne Butler DASEN, MD  [facility] Providers Cardiologist:  Peter Jordan, MD    History of Present Illness: .   Antionne Enrique. is a 73 y.o. male.  Discussed the use of AI scribe software for clinical note transcription with the patient, who gave verbal consent to proceed.  History of Present Illness Zayquan Bogard. is a 73 year old male with coronary artery disease status post NSTEMI requiring CABG 05/29/2024 who presents for follow-up, with chief complaint of memory concerns.  Patient presents for routine follow-up but was placed on my schedule, I am happy to see him in follow-up today.  He will resume care with Dr. Jordan at next follow-up.  He underwent CABG in July 2025 and was seen in the cardiology office in August 2025. He has been feeling well overall with no cardiovascular issues after the surgery.  Medications included aspirin  81 mg daily, Lipitor  80 mg daily, Plavix  75 mg daily, Zetia  10 mg daily, and Lopressor  25 mg twice daily. A recent fasting lipid panel showed significant improvement with an LDL of 22, HDL of 47, and triglycerides of 140.  Therapy was titrated to Plavix  monotherapy, and then subsequently transition to aspirin  325 mg daily by primary care doctor.  We discussed indications for antiplatelet therapy after NSTEMI and revascularization.  I will communicate with the primary care physician to be in alignment on antiplatelet therapy.  He experiences no chest pain or shortness of breath. He notes less fatigue than before, although his naps have become longer.  His wife's main concern is exacerbation of memory issues post cardiopulmonary bypass.  Primary care doctor pursued MRI brain which demonstrated abnormal findings requiring interval MRI follow-up.  They will pursue this with the primary care doctor.    ROS: negative except per HPI  above.  Studies Reviewed: .        Results LABS Fasting lipid panel: HDL 47, Triglycerides 140, LDL 22 (09/16/2024)  RADIOLOGY Brain MRI: Nonspecific 5 mm focus of asymmetric enhancement within the left thalamus. Concern for late subacute infarct versus vascular enhancement or a developmental venous anomaly. Neoplastic lesion could not be excluded. (07/09/2024) Risk Assessment/Calculations:       Physical Exam:   VS:  BP 122/82 (BP Location: Right Arm, Patient Position: Sitting, Cuff Size: Large)   Pulse 86   Resp 16   Ht 5' 9 (1.753 m)   Wt 201 lb (91.2 kg)   SpO2 93%   BMI 29.68 kg/m    Wt Readings from Last 3 Encounters:  09/23/24 201 lb (91.2 kg)  08/11/24 197 lb 9.6 oz (89.6 kg)  06/26/24 196 lb 12.8 oz (89.3 kg)     Physical Exam GENERAL: Alert, cooperative, well developed, no acute distress. HEENT: Normocephalic, normal oropharynx, moist mucous membranes. CHEST: Clear to auscultation bilaterally, no wheezes, rhonchi, or crackles. CARDIOVASCULAR: Normal heart rate and rhythm, S1 and S2 normal without murmurs. ABDOMEN: Soft, non-tender, non-distended, without organomegaly, normal bowel sounds. EXTREMITIES: No cyanosis or edema. NEUROLOGICAL: Cranial nerves grossly intact, moves all extremities without gross motor or sensory deficit.   ASSESSMENT AND PLAN: .    Assessment and Plan Assessment & Plan Coronary artery disease status post CABG Status post CABG with LIMA to LAD, SVG to diagonal, and SVG to OM. No current chest pain or dyspnea. Released from surgical restrictions by cardiovascular surgery. -  Continue Lipitor  80 mg daily. - Continue Zetia  10 mg daily. - Continue metoprolol  tartrate 25 mg BID. - Patient is on a regimen of aspirin  325 mg daily.  After NSTEMI and CABG, may be best to continue on Plavix  75 mg daily as opposed to aspirin  325 daily. I will forward this recommendation to the patient's primary care doctor, they will discuss further with him.    Hyperlipidemia Well-controlled with current lipid panel showing HDL 47, triglycerides 140, and LDL 22 on atorvastatin  and Zetia . Significant improvement from previous levels. - Continue Lipitor  80 mg daily. - Continue Zetia  10 mg daily.  Mild bilateral carotid artery stenosis - Monitor with interval imaging  Memory impairment following cardiac surgery with abnormal brain MRI, left thalamic lesion Memory impairment noted post-cardiac surgery, with MRI showing a nonspecific 5 mm focus of asymmetric enhancement within the left thalamus. Differential includes late subacute infarct, vascular enhancement, developmental venous anomaly, or neoplastic lesion. Follow-up MRI recommended in 8 weeks. - Follow up with primary care for brain MRI which by radiology report appears due now. - Discuss memory concerns with primary care provider.  Known instances of worsening of cognitive impairment status post cardiopulmonary bypass machine.        Soyla Merck, MD, FACC

## 2024-10-15 ENCOUNTER — Other Ambulatory Visit: Payer: Self-pay | Admitting: Family Medicine

## 2024-10-15 DIAGNOSIS — G939 Disorder of brain, unspecified: Secondary | ICD-10-CM

## 2024-10-16 ENCOUNTER — Encounter: Payer: Self-pay | Admitting: Family Medicine

## 2024-10-30 ENCOUNTER — Inpatient Hospital Stay: Admission: RE | Admit: 2024-10-30 | Discharge: 2024-10-30 | Attending: Family Medicine | Admitting: Family Medicine

## 2024-10-30 DIAGNOSIS — G939 Disorder of brain, unspecified: Secondary | ICD-10-CM

## 2024-10-30 MED ORDER — GADOPICLENOL 0.5 MMOL/ML IV SOLN
10.0000 mL | Freq: Once | INTRAVENOUS | Status: AC | PRN
  Administered 2024-10-30: 9 mL via INTRAVENOUS

## 2024-11-06 ENCOUNTER — Ambulatory Visit: Payer: Self-pay | Admitting: Family Medicine
